# Patient Record
Sex: Female | Born: 1995 | Race: White | Hispanic: No | Marital: Single | Smoking: Never smoker
Health system: Southern US, Community
[De-identification: ages and names within clinical notes are randomized; demographics above are authoritative.]

## PROBLEM LIST (undated history)

## (undated) DIAGNOSIS — E7800 Pure hypercholesterolemia, unspecified: Secondary | ICD-10-CM

## (undated) DIAGNOSIS — J45909 Unspecified asthma, uncomplicated: Secondary | ICD-10-CM

## (undated) DIAGNOSIS — F3181 Bipolar II disorder: Secondary | ICD-10-CM

## (undated) DIAGNOSIS — J309 Allergic rhinitis, unspecified: Secondary | ICD-10-CM

## (undated) DIAGNOSIS — G4733 Obstructive sleep apnea (adult) (pediatric): Secondary | ICD-10-CM

## (undated) DIAGNOSIS — Z90710 Acquired absence of both cervix and uterus: Secondary | ICD-10-CM

## (undated) DIAGNOSIS — F319 Bipolar disorder, unspecified: Secondary | ICD-10-CM

## (undated) DIAGNOSIS — N83209 Unspecified ovarian cyst, unspecified side: Secondary | ICD-10-CM

## (undated) DIAGNOSIS — K589 Irritable bowel syndrome without diarrhea: Secondary | ICD-10-CM

## (undated) DIAGNOSIS — F419 Anxiety disorder, unspecified: Secondary | ICD-10-CM

## (undated) HISTORY — DX: Bipolar II disorder: F31.81

## (undated) HISTORY — PX: WISDOM TOOTH EXTRACTION: SHX21

## (undated) HISTORY — PX: VAGINOPLASTY: SHX329

## (undated) HISTORY — PX: OTHER SURGICAL HISTORY: SHX169

## (undated) HISTORY — DX: Unspecified asthma, uncomplicated: J45.909

## (undated) HISTORY — PX: ABDOMINAL SURGERY: SHX537

## (undated) HISTORY — DX: Allergic rhinitis, unspecified: J30.9

## (undated) HISTORY — DX: Acquired absence of both cervix and uterus: Z90.710

---
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
## 2017-10-02 ENCOUNTER — Emergency Department: Payer: BLUE CROSS/BLUE SHIELD

## 2017-10-02 ENCOUNTER — Encounter: Payer: Self-pay | Admitting: Emergency Medicine

## 2017-10-02 DIAGNOSIS — Y9389 Activity, other specified: Secondary | ICD-10-CM | POA: Diagnosis not present

## 2017-10-02 DIAGNOSIS — W08XXXA Fall from other furniture, initial encounter: Secondary | ICD-10-CM | POA: Diagnosis not present

## 2017-10-02 DIAGNOSIS — S0083XA Contusion of other part of head, initial encounter: Secondary | ICD-10-CM | POA: Insufficient documentation

## 2017-10-02 DIAGNOSIS — S0990XA Unspecified injury of head, initial encounter: Secondary | ICD-10-CM | POA: Diagnosis not present

## 2017-10-02 DIAGNOSIS — Y92013 Bedroom of single-family (private) house as the place of occurrence of the external cause: Secondary | ICD-10-CM | POA: Diagnosis not present

## 2017-10-02 DIAGNOSIS — Y999 Unspecified external cause status: Secondary | ICD-10-CM | POA: Insufficient documentation

## 2017-10-02 NOTE — ED Triage Notes (Signed)
Patient hit her head tonight on her windowsill and has spotty memory after. She also says her eyes have been dilated since with feelings of nausea and no vomiting.  Pt reports headache and photosensitivity.  Pt denies LOC or laceration to head.

## 2017-10-02 NOTE — ED Notes (Signed)
Patient transported to CT 

## 2017-10-02 NOTE — ED Triage Notes (Signed)
Patient is AOx4 in NAD

## 2017-10-03 ENCOUNTER — Emergency Department
Admission: EM | Admit: 2017-10-03 | Discharge: 2017-10-03 | Disposition: A | Discharge status: Home / Self Care | Payer: BLUE CROSS/BLUE SHIELD | Attending: Emergency Medicine | Admitting: Emergency Medicine

## 2017-10-03 DIAGNOSIS — S0990XA Unspecified injury of head, initial encounter: Secondary | ICD-10-CM

## 2017-10-03 DIAGNOSIS — S0093XA Contusion of unspecified part of head, initial encounter: Secondary | ICD-10-CM

## 2017-10-03 HISTORY — DX: Bipolar disorder, unspecified: F31.9

## 2017-10-03 HISTORY — DX: Anxiety disorder, unspecified: F41.9

## 2017-10-03 HISTORY — DX: Pure hypercholesterolemia, unspecified: E78.00

## 2017-10-03 NOTE — ED Provider Notes (Signed)
[facility] Emergency Department Provider Note  ____________________________________________   First MD Initiated Contact with Patient 10/03/17 0319     (approximate)  I have reviewed the triage vital signs and the nursing notes.   HISTORY  Chief Complaint Possible Concussion    HPI Linda Chambers is a 22 y.o. female with medical history as listed below who presents for evaluation of a head injury.  She reports that she was getting into bed in her apartment and sort of jumped into the bed and struck the back of her head on a windowsill.  She states that she did not lose consciousness but she cried afterwards for period of time.  She has had a generalized headache since that time with some sensitivity to light.  She says that her eyes have been dilated since that time and she knows that is bad in the setting of a head injury.  She has had nausea but no vomiting.  She feels like she might be a little bit confused about the events surrounding the injury but in general her memory is fine.  She presents with a friend.  She has no laceration on the back of her head and is not sure if she has any swelling.  She denies any other injuries including no neck pain, chest pain, shortness of breath, abdominal pain, or recent dysuria or fever/chills.  She describes the onset as acute and severe.  Past Medical History:  Diagnosis Date  . Anxiety   . Bipolar 1 disorder (HCC)   . Hypercholesteremia     There are no active problems to display for this patient.   Past Surgical History:  Procedure Laterality Date  . ABDOMINAL SURGERY    . WISDOM TOOTH EXTRACTION Bilateral     Prior to Admission medications   Not on File    Allergies Patient has no known allergies.  No family history on file.  Social History Social History   Tobacco Use  . Smoking status: Never Smoker  . Smokeless tobacco: Never Used  Substance Use Topics  . Alcohol use: Never    Frequency: Never   . Drug use: Never    Review of Systems Constitutional: No fever/chills Eyes: Dilated pupils and some photosensitivity ENT:  no neck or throat pain Cardiovascular: Denies chest pain. Respiratory: Denies shortness of breath. Gastrointestinal: No abdominal pain.  Nausea, no vomiting.  No diarrhea.  No constipation. Genitourinary: Negative for dysuria. Musculoskeletal: Negative for neck pain.  Negative for back pain. Integumentary: Negative for rash. Neurological: Generalized headache, questionable spotty memory, dilated pupils.  No numbness or weakness in her extremities.   ____________________________________________   PHYSICAL EXAM:  VITAL SIGNS: ED Triage Vitals  Enc Vitals Group     BP 10/02/17 2307 126/80     Pulse Rate 10/02/17 2307 98     Resp 10/02/17 2307 18     Temp 10/02/17 2307 98.8 F (37.1 C)     Temp Source 10/02/17 2307 Oral     SpO2 10/02/17 2307 98 %     Weight --      Height --      Head Circumference --      Peak Flow --      Pain Score 10/02/17 2308 7     Pain Loc --      Pain Edu? --      Excl. in GC? --     Constitutional: Alert and oriented. Well appearing and in no acute distress. Eyes:  Conjunctivae are normal.  Pupils are large bilaterally but are appropriately responsive to light with normal accommodation bilaterally.  Normal extraocular movement. Head: Atraumatic.  I cannot palpate any hematoma and there is no laceration.  She reports some tenderness to the back of her head. Nose: No congestion/rhinnorhea. Mouth/Throat: Mucous membranes are moist. Neck: No stridor.  No meningeal signs.  No cervical spine tenderness to palpation.  Normal range of motion with flexion, extension, and rotation that is nontender and nonpainful. Cardiovascular: Normal rate, regular rhythm. Good peripheral circulation. Grossly normal heart sounds. Respiratory: Normal respiratory effort.  No retractions. Lungs CTAB. Gastrointestinal: Soft and nontender. No  distention.  Musculoskeletal: No lower extremity tenderness nor edema. No gross deformities of extremities. Neurologic:  Normal speech and language. No gross focal neurologic deficits are appreciated.  Skin:  Skin is warm, dry and intact. No rash noted. Psychiatric: Mood and affect are normal. Speech and behavior are normal.  ____________________________________________   LABS (all labs ordered are listed, but only abnormal results are displayed)  Labs Reviewed - No data to display ____________________________________________  EKG  None - EKG not ordered by ED physician ____________________________________________  RADIOLOGY   ED MD interpretation: Radiology reports no indication of any acute intracranial process  Official radiology report(s): Ct Head Wo Contrast  Result Date: 10/03/2017 CLINICAL DATA:  Posttraumatic headache. Patient hit head on the window sill and has body memory afterwards. Eyes dilated. Nausea. EXAM: CT HEAD WITHOUT CONTRAST TECHNIQUE: Contiguous axial images were obtained from the base of the skull through the vertex without intravenous contrast. COMPARISON:  None. FINDINGS: Brain: No evidence of acute infarction, hemorrhage, hydrocephalus, extra-axial collection or mass lesion/mass effect. Vascular: No hyperdense vessel or unexpected calcification. Skull: Normal. Negative for fracture or focal lesion. Sinuses/Orbits: No acute finding. Other: None. IMPRESSION: No acute intracranial abnormalities. Electronically Signed   By: Burman NievesWilliam  Stevens M.D.   On: 10/03/2017 00:50    ____________________________________________   PROCEDURES  Critical Care performed: No   Procedure(s) performed:   Procedures   ____________________________________________   INITIAL IMPRESSION / ASSESSMENT AND PLAN / ED COURSE  As part of my medical decision making, I reviewed the following data within the electronic MEDICAL RECORD NUMBER Nursing notes reviewed and incorporated      Differential diagnosis includes, but is not limited to, minor head injury without loss of consciousness, concussion, intracranial bleeding such as a subdural or subarachnoid hemorrhage, skull fracture, etc.  Fortunately the patient's physical exam is quite reassuring.  I believe that the nature of the injury scared her, but she has no physical findings of acute injury and a reassuring head CT.  I had my usual customary discussion about minor head injuries, concussions and why they are a clinical diagnosis, and my usual recommendations to avoid contact sports and to try and stick with brain rest when possible although in her case I explained that I do not think it is absolutely necessary and she should just limit herself by how she feels.  I encouraged over-the-counter pain medication and plenty of rest.  She understands and agrees with the plan.     ____________________________________________  FINAL CLINICAL IMPRESSION(S) / ED DIAGNOSES  Final diagnoses:  Minor head injury, initial encounter  Contusion of head, unspecified part of head, initial encounter     MEDICATIONS GIVEN DURING THIS VISIT:  Medications - No data to display   ED Discharge Orders    None       Note:  This document was prepared using Dragon  voice recognition software and may include unintentional dictation errors.    Loleta Rose, MD 10/03/17 [phone]

## 2017-10-03 NOTE — Discharge Instructions (Signed)
You were seen in the Emergency Department (ED) today for a head injury. Your evaluation and imaging were reassuring.  I do not think you have a concussion, and I think you will improve rapidly.  However, symptoms to expect from a concussion include nausea, mild to moderate headache, difficulty concentrating or sleeping, and mild lightheadedness.  These symptoms should improve over the next few days to weeks, but it may take many weeks before you feel back to normal.  Return to the emergency department or follow-up with your primary care doctor if your symptoms are not improving over this time.  Signs of a more serious head injury include vomiting, severe headache, excessive sleepiness or confusion, and weakness or numbness in your face, arms or legs.  Return immediately to the Emergency Department if you experience any of these more concerning symptoms.    Rest, avoid strenuous physical or mental activity, and avoid activities that could potentially result in another head injury until all your symptoms from this head injury are completely resolved for at least 2-3 weeks.  If you participate in sports, get cleared by your doctor or trainer before returning to play.  You may take ibuprofen or acetaminophen over the counter according to label instructions for mild headache or scalp soreness.

## 2017-10-03 NOTE — ED Notes (Signed)
Warm blanket provided to pt.

## 2017-10-03 NOTE — ED Notes (Signed)
Pt complaining of headache and also informed that provider would see pt and order medication if needed.

## 2017-10-14 ENCOUNTER — Encounter: Payer: Self-pay | Admitting: Family Medicine

## 2017-10-14 ENCOUNTER — Ambulatory Visit (INDEPENDENT_AMBULATORY_CARE_PROVIDER_SITE_OTHER): Payer: BLUE CROSS/BLUE SHIELD | Admitting: Family Medicine

## 2017-10-14 VITALS — BP 122/80 | HR 95 | Ht 64.25 in | Wt 188.2 lb

## 2017-10-14 DIAGNOSIS — Z7689 Persons encountering health services in other specified circumstances: Secondary | ICD-10-CM | POA: Diagnosis not present

## 2017-10-14 DIAGNOSIS — Z803 Family history of malignant neoplasm of breast: Secondary | ICD-10-CM | POA: Diagnosis not present

## 2017-10-14 DIAGNOSIS — J4520 Mild intermittent asthma, uncomplicated: Secondary | ICD-10-CM | POA: Diagnosis not present

## 2017-10-14 DIAGNOSIS — F419 Anxiety disorder, unspecified: Secondary | ICD-10-CM

## 2017-10-14 DIAGNOSIS — F3130 Bipolar disorder, current episode depressed, mild or moderate severity, unspecified: Secondary | ICD-10-CM | POA: Diagnosis not present

## 2017-10-14 NOTE — Progress Notes (Signed)
 Subjective:    Patient ID: Linda Chambers, female    DOB: 08/12/1995, 21 y.o.   MRN: [id number]  HPI Chief Complaint  Patient presents with  . new pt    new pt get established. wants to talk about seeing obgyn.    She is new to the practice and here to establish care: moved here from [place].  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 Plans to go to graduate school for social work.   Previous medical care: urgent care and in [place].   Concerns today include: Breast cancer in family. Maternal aunt diagnosed in her 30s. MGM diagnosed in her 50s.  She has been tested for BRCA gene and negative. This was done in [place] at her PCP.   States she would like to be taught how to do self breast exams.  She lost weight recently, 16 lbs by eating healthy and reducing her calories and exercising.   Acne- taking spironolactone for one year and she has noticed significant improvement in her acne  Exercise-induced asthma-states she has an albuterol inhaler at home that she uses as needed.  No recent flares.  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  States she does have her ovaries.  Other providers: Dr. Kupaur - psychiatrist in [place] and sees her every few months.  History of bipolar depression and anxiety. On Latuda and states that this medication has helped tremendously.  States she has tried multiple medications in the past. Sees counselor Tina Thompson in [place] every 2 weeks. In therapy since age 14.    Social history: [place] campus, works in a fellowship on campus. In a relationship with a female.  Denies smoking, drinking alcohol, drug use  Diet: healthy  Excerise: a couple times per week  Sexually active and uses condoms.   Has eye exams annually.  Dentist in [place].   Depression screen PHQ 2/9 10/14/2017  Decreased Interest 1  Down, Depressed, Hopeless 1  PHQ - 2 Score 2  Altered sleeping 3  Tired, decreased energy 3  Change in  appetite 3  Feeling bad or failure about yourself  0  Trouble concentrating 0  Moving slowly or fidgety/restless 0  Suicidal thoughts 0  PHQ-9 Score 11  Difficult doing work/chores Not difficult at all    Reviewed allergies, medications, past medical, surgical, family, and social history.   Review of Systems Pertinent positives and negatives in the history of present illness.     Objective:   Physical Exam  Constitutional: She is oriented to person, place, and time. She appears well-developed and well-nourished. No distress.  Cardiovascular: Normal rate, regular rhythm and normal heart sounds.  Pulmonary/Chest: Effort normal and breath sounds normal.  Normal breast exam- done per patient request  Neurological: She is alert and oriented to person, place, and time. She has normal strength. No cranial nerve deficit or sensory deficit.  Skin: Skin is warm and dry. Capillary refill takes less than 2 seconds.  Psychiatric: She has a normal mood and affect. Her speech is normal and behavior is normal. Thought content normal.   BP 122/80   Pulse 95   Ht 5' 4.25" (1.632 m)   Wt 188 lb 3.2 oz (85.4 kg)   BMI 32.05 kg/m       Assessment & Plan:  Bipolar affective disorder, current episode depressed, current episode severity unspecified (HCC)  Anxiety  Family history of breast cancer  Encounter to establish care  Mild intermittent asthma without   complication  Breast exam and teaching done on how to do self breast exams per patient request due to family history.  A handout of breast exams provided.  Asthma is well controlled. She has albuterol inhaler at home. No recent flares.  Bipolar and anxiety managed by her psychiatrist and counselor. No concerns today about this.  Follow up as needed. Will request medical records from [place].

## 2017-10-14 NOTE — Patient Instructions (Signed)

## 2017-10-21 ENCOUNTER — Telehealth: Payer: Self-pay | Admitting: Family Medicine

## 2017-10-21 NOTE — Telephone Encounter (Signed)
Received requested records from Dr. Jolyn LentStephanie Stegall. Sending back for review.

## 2017-11-24 ENCOUNTER — Encounter: Payer: Self-pay | Admitting: Family Medicine

## 2018-01-22 ENCOUNTER — Ambulatory Visit (INDEPENDENT_AMBULATORY_CARE_PROVIDER_SITE_OTHER): Payer: BLUE CROSS/BLUE SHIELD | Admitting: Family Medicine

## 2018-01-22 ENCOUNTER — Encounter: Payer: Self-pay | Admitting: Family Medicine

## 2018-01-22 VITALS — BP 110/80 | HR 90 | Temp 98.3°F | Resp 16 | Wt 194.4 lb

## 2018-01-22 DIAGNOSIS — Z79899 Other long term (current) drug therapy: Secondary | ICD-10-CM | POA: Diagnosis not present

## 2018-01-22 DIAGNOSIS — R519 Headache, unspecified: Secondary | ICD-10-CM

## 2018-01-22 DIAGNOSIS — R7309 Other abnormal glucose: Secondary | ICD-10-CM

## 2018-01-22 DIAGNOSIS — R51 Headache: Secondary | ICD-10-CM

## 2018-01-22 DIAGNOSIS — E781 Pure hyperglyceridemia: Secondary | ICD-10-CM

## 2018-01-22 NOTE — Patient Instructions (Signed)
Keep a headache journal as discussed with signs, symptoms, triggers.   Stay well hydrated, avoid skipping meals, and avoid fluctuations in caffeine.  We will call you with your lab results.   Return in 2 weeks.    Migraine Headache A migraine headache is an intense, throbbing pain on one side or both sides of the head. Migraines may also cause other symptoms, such as nausea, vomiting, and sensitivity to light and noise. What are the causes? Doing or taking certain things may also trigger migraines, such as:  Alcohol.  Smoking.  Medicines, such as: ? Medicine used to treat chest pain (nitroglycerine). ? Birth control pills. ? Estrogen pills. ? Certain blood pressure medicines.  Aged cheeses, chocolate, or caffeine.  Foods or drinks that contain nitrates, glutamate, aspartame, or tyramine.  Physical activity.  Other things that may trigger a migraine include:  Menstruation.  Pregnancy.  Hunger.  Stress, lack of sleep, too much sleep, or fatigue.  Weather changes.  What increases the risk? The following factors may make you more likely to experience migraine headaches:  Age. Risk increases with age.  Family history of migraine headaches.  Being Caucasian.  Depression and anxiety.  Obesity.  Being a woman.  Having a hole in the heart (patent foramen ovale) or other heart problems.  What are the signs or symptoms? The main symptom of this condition is pulsating or throbbing pain. Pain may:  Happen in any area of the head, such as on one side or both sides.  Interfere with daily activities.  Get worse with physical activity.  Get worse with exposure to bright lights or loud noises.  Other symptoms may include:  Nausea.  Vomiting.  Dizziness.  General sensitivity to bright lights, loud noises, or smells.  Before you get a migraine, you may get warning signs that a migraine is developing (aura). An aura may include:  Seeing flashing lights or  having blind spots.  Seeing bright spots, halos, or zigzag lines.  Having tunnel vision or blurred vision.  Having numbness or a tingling feeling.  Having trouble talking.  Having muscle weakness.  How is this diagnosed? A migraine headache can be diagnosed based on:  Your symptoms.  A physical exam.  Tests, such as CT scan or MRI of the head. These imaging tests can help rule out other causes of headaches.  Taking fluid from the spine (lumbar puncture) and analyzing it (cerebrospinal fluid analysis, or CSF analysis).  How is this treated? A migraine headache is usually treated with medicines that:  Relieve pain.  Relieve nausea.  Prevent migraines from coming back.  Treatment may also include:  Acupuncture.  Lifestyle changes like avoiding foods that trigger migraines.  Follow these instructions at home: Medicines  Take over-the-counter and prescription medicines only as told by your health care provider.  Do not drive or use heavy machinery while taking prescription pain medicine.  To prevent or treat constipation while you are taking prescription pain medicine, your health care provider may recommend that you: ? Drink enough fluid to keep your urine clear or pale yellow. ? Take over-the-counter or prescription medicines. ? Eat foods that are high in fiber, such as fresh fruits and vegetables, whole grains, and beans. ? Limit foods that are high in fat and processed sugars, such as fried and sweet foods. Lifestyle  Avoid alcohol use.  Do not use any products that contain nicotine or tobacco, such as cigarettes and e-cigarettes. If you need help quitting, ask your health  care provider.  Get at least 8 hours of sleep every night.  Limit your stress. General instructions   Keep a journal to find out what may trigger your migraine headaches. For example, write down: ? What you eat and drink. ? How much sleep you get. ? Any change to your diet or  medicines.  If you have a migraine: ? Avoid things that make your symptoms worse, such as bright lights. ? It may help to lie down in a dark, quiet room. ? Do not drive or use heavy machinery. ? Ask your health care provider what activities are safe for you while you are experiencing symptoms.  Keep all follow-up visits as told by your health care provider. This is important. Contact a health care provider if:  You develop symptoms that are different or more severe than your usual migraine symptoms. Get help right away if:  Your migraine becomes severe.  You have a fever.  You have a stiff neck.  You have vision loss.  Your muscles feel weak or like you cannot control them.  You start to lose your balance often.  You develop trouble walking.  You faint. This information is not intended to replace advice given to you by your health care provider. Make sure you discuss any questions you have with your health care provider. Document Released: 03/31/2005 Document Revised: 10/19/2015 Document Reviewed: 09/17/2015 Elsevier Interactive Patient Education  2017 [employer].

## 2018-01-22 NOTE — Progress Notes (Signed)
Subjective:    Patient ID: Linda Chambers, female    DOB: 07-01-1995, 22 y.o.   MRN: [id number]  HPI Chief Complaint  Patient presents with  . headache    consistent headache. headache for a month- nausated and have to turn lights out, family hx of migraines   She is a 22 year old female with a history of bipolar disorder, hypertriglyceridemia, anxiety and acne who is here with complaints of a one month history of intermittent headaches that are "all over" and throbbing. Reports having similar headaches as a child but her mother never took her to a doctor to be evaluated. States headaches are at various times of the day and last anywhere from an hour to a full day.  Last one was 2 days ago.  Reports having 2-3 per week for the past month. Associated nausea, photosensitivity. No photophobia.  Denies having an aura.   States headaches always resolve with Extra Strength Tylenol or Excedrin. Takes one of these 2 days per week.   Denies fever, chills, dizziness, tinnitus, vision changes, chest pain, palpitations, shortness of breath, abdominal pain, N/V/D, urinary symptoms, LE edema.  No uri symptoms.  Reports drinking 1 cup of coffee daily and  1-2 diet cokes per week. States she thinks she is well hydrated.  Sleep varies and she often skips breakfast.   Latuda dose increased by her psychiatrist 2 weeks ago. Taking spironolactone for acne. Started on this in 2017 by her pediatrician. Questions if she can stop the medication.   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  Denies smoking, alcohol or drug use.    Family history of migraines in father and grandmother per patient.     Reviewed allergies, medications, past medical, surgical, family, and social history.    Review of Systems Pertinent positives and negatives in the history of present illness.     Objective:   Physical Exam BP 110/80   Pulse 90   Temp 98.3 F (36.8 C) (Oral)   Resp 16   Wt 194 lb 6.4 oz  (88.2 kg)   SpO2 98%   BMI 33.11 kg/m   Alert and oriented and in no distress. No sinus tenderness. Tympanic membranes and canals are normal. Pharyngeal area is normal. Neck is supple without adenopathy or thyromegaly. Cardiac exam shows a regular sinus rhythm without murmurs or gallops. Lungs are clear to auscultation. Extremities without edema, intact distal pulses. Skin is warm and dry, no pallor or rash. PERRLA, EOMs intact. No facial asymmetry, normal finger to nose. CNs intact. Normal gait.       Assessment & Plan:  Intermittent headache - Plan: CBC with Differential/Platelet, Comprehensive metabolic panel  Medication management - Plan: CBC with Differential/Platelet, Comprehensive metabolic panel  Elevated hemoglobin A1c measurement - Plan: CBC with Differential/Platelet, Comprehensive metabolic panel, Hemoglobin A1c  Hypertriglyceridemia - Plan: CBC with Differential/Platelet, Comprehensive metabolic panel, Lipid panel  Reviewed CT head from her visit in the ED in June 2019 and it was negative.  Normal neurological exam. No headache in the past 2 days.  She apparently has a history of similar headaches from childhood but denies ever being evaluated for these until today.  Headaches sound migrainous in nature. Will check labs due to history of elevated triglycerides related to medication and A1c due to Hgb A1c 5.7% last year.  Will have her keep a headache journal and avoid fluctuations in sleep, hydration, caffeine and avoid skipping meals.   Discussed sending her to  a headache specialist and she declines for now. I am not starting her on preventive medication today but after she returns in 2 weeks we will discuss options again. She will return in 2 weeks.

## 2018-01-23 LAB — COMPREHENSIVE METABOLIC PANEL
ALT: 23 IU/L (ref 0–32)
AST: 16 IU/L (ref 0–40)
Albumin/Globulin Ratio: 1.6 (ref 1.2–2.2)
Albumin: 4.6 g/dL (ref 3.5–5.5)
Alkaline Phosphatase: 100 IU/L (ref 39–117)
BUN/Creatinine Ratio: 10 (ref 9–23)
BUN: 8 mg/dL (ref 6–20)
Bilirubin Total: <0.2 mg/dL (ref 0.0–1.2)
CO2: 26 mmol/L (ref 20–29)
Calcium: 10.2 mg/dL (ref 8.7–10.2)
Chloride: 99 mmol/L (ref 96–106)
Creatinine, Ser: 0.83 mg/dL (ref 0.57–1.00)
GFR calc Af Amer: 117 mL/min/{1.73_m2} (ref >59)
GFR calc non Af Amer: 102 mL/min/{1.73_m2} (ref >59)
Globulin, Total: 2.8 g/dL (ref 1.5–4.5)
Glucose: 92 mg/dL (ref 65–99)
Potassium: 4.5 mmol/L (ref 3.5–5.2)
Sodium: 140 mmol/L (ref 134–144)
Total Protein: 7.4 g/dL (ref 6.0–8.5)

## 2018-01-23 LAB — CBC WITH DIFFERENTIAL/PLATELET
Basophils Absolute: 0 10*3/uL (ref 0.0–0.2)
Basos: 0 %
EOS (ABSOLUTE): 0.2 10*3/uL (ref 0.0–0.4)
Eos: 2 %
Hematocrit: 39.9 % (ref 34.0–46.6)
Hemoglobin: 13.5 g/dL (ref 11.1–15.9)
Immature Grans (Abs): 0 10*3/uL (ref 0.0–0.1)
Immature Granulocytes: 0 %
Lymphocytes Absolute: 3.4 10*3/uL — ABNORMAL HIGH (ref 0.7–3.1)
Lymphs: 36 %
MCH: 26.7 pg (ref 26.6–33.0)
MCHC: 33.8 g/dL (ref 31.5–35.7)
MCV: 79 fL (ref 79–97)
Monocytes Absolute: 0.6 10*3/uL (ref 0.1–0.9)
Monocytes: 6 %
Neutrophils Absolute: 5.1 10*3/uL (ref 1.4–7.0)
Neutrophils: 56 %
Platelets: 433 10*3/uL (ref 150–450)
RBC: 5.05 x10E6/uL (ref 3.77–5.28)
RDW: 13.7 % (ref 12.3–15.4)
WBC: 9.3 10*3/uL (ref 3.4–10.8)

## 2018-01-23 LAB — HEMOGLOBIN A1C
Est. average glucose Bld gHb Est-mCnc: 108 mg/dL
Hgb A1c MFr Bld: 5.4 % (ref 4.8–5.6)

## 2018-01-23 LAB — LIPID PANEL
Chol/HDL Ratio: 6 ratio — ABNORMAL HIGH (ref 0.0–4.4)
Cholesterol, Total: 216 mg/dL — ABNORMAL HIGH (ref 100–199)
HDL: 36 mg/dL — ABNORMAL LOW (ref >39)
Triglycerides: 507 mg/dL — ABNORMAL HIGH (ref 0–149)

## 2018-02-11 ENCOUNTER — Encounter: Payer: Self-pay | Admitting: Family Medicine

## 2018-02-11 ENCOUNTER — Ambulatory Visit (INDEPENDENT_AMBULATORY_CARE_PROVIDER_SITE_OTHER): Payer: BLUE CROSS/BLUE SHIELD | Admitting: Family Medicine

## 2018-02-11 VITALS — BP 122/80 | HR 95 | Wt 200.2 lb

## 2018-02-11 DIAGNOSIS — J4520 Mild intermittent asthma, uncomplicated: Secondary | ICD-10-CM

## 2018-02-11 DIAGNOSIS — R519 Headache, unspecified: Secondary | ICD-10-CM

## 2018-02-11 DIAGNOSIS — G8929 Other chronic pain: Secondary | ICD-10-CM

## 2018-02-11 DIAGNOSIS — J309 Allergic rhinitis, unspecified: Secondary | ICD-10-CM

## 2018-02-11 DIAGNOSIS — R51 Headache: Secondary | ICD-10-CM

## 2018-02-11 DIAGNOSIS — F419 Anxiety disorder, unspecified: Secondary | ICD-10-CM | POA: Insufficient documentation

## 2018-02-11 DIAGNOSIS — E7800 Pure hypercholesterolemia, unspecified: Secondary | ICD-10-CM | POA: Insufficient documentation

## 2018-02-11 DIAGNOSIS — J45909 Unspecified asthma, uncomplicated: Secondary | ICD-10-CM | POA: Insufficient documentation

## 2018-02-11 DIAGNOSIS — F319 Bipolar disorder, unspecified: Secondary | ICD-10-CM | POA: Insufficient documentation

## 2018-02-11 NOTE — Progress Notes (Signed)
   Subjective:    Patient ID: Linda Chambers, female    DOB: 07/09/1995, 22 y.o.   MRN: [id number]  HPI Chief Complaint  Patient presents with  . 2-3 week follow-up    headaches, not waking up with them, getting them mid day, 1 a week that is really bad   She is a 22 year old female with a history of seasonal allergies and mild intermittent asthma who is here to follow up on headaches. Questionable history of migraines without aura. States she recalled having more headaches in the past during summer months. Since her previous visit, she reports headaches have improved in regards to frequency and intensity overall. No longer waking up with as many headaches. States she has noticed improvement with increasing her water intake.   States last week she had the worst headache of her life. It was a frontal headache, bilateral and described as a throbbing pain. States it lasted until she took Excedrin. She had associated nausea and photophobia. States she missed class that day.   Reports eating breakfast most mornings now, stopping diet sodas and cutting caffeine back to one cup of coffee daily.   She saw her psychiatrist last week.  She saw her allergist when she went home to [place] last week. She is taking Zyrtec daily for allergies. No asthma flares. She uses albuterol before exercising.   No new symptoms.   Denies fever, chills, dizziness, vision changes, chest pain, palpitations, shortness of breath, abdominal pain, V/D.   Reviewed allergies, medications, past medical, surgical, family, and social history.    Review of Systems Pertinent positives and negatives in the history of present illness.     Objective:   Physical Exam BP 122/80   Pulse 95   Wt 200 lb 3.2 oz (90.8 kg) Comment: with rain boots on  BMI 34.10 kg/m   Alert and oriented and in no acute distress.  Respirations unlabored.  Skin is warm and dry, no pallor.  Normal facial symmetry and movement.  Normal speech, mood  and thought process.  Normal gait      Assessment & Plan:  Chronic nonintractable headache, unspecified headache type  Mild intermittent asthma without complication  Allergic rhinitis, unspecified seasonality, unspecified trigger  Discussed that overall her headaches are actually improving and she has noticed decreased frequency and intensity when staying hydrated.  Discussed that headaches do not appear to be related to anything worrisome.  She questions whether her headaches will change once the weather changes in the cold front comes through this evening.  I think this is reasonable since her headaches have been seasonal in the past. Allergies and asthma well controlled. Question whether allergies may be playing a role with headaches.  She will follow-up if needed.  We did discuss the possibility of a neurology referral and she would like to hold off on this for now which is appropriate.

## 2018-05-10 ENCOUNTER — Telehealth: Payer: Self-pay | Admitting: Internal Medicine

## 2018-05-10 NOTE — Telephone Encounter (Signed)
Please call and find out how long she has been on this medication and who prescribed it for her initially.  Has she been taking it and was she taking it 3 months ago when I checked her cholesterol?  Her triglycerides were significantly elevated at that time.  Please have her return to discuss this and to recheck her cholesterol, fasting.  If she has not missed any doses then I am okay sending in a 30-day prescription for her

## 2018-05-10 NOTE — Telephone Encounter (Signed)
Pt was notified. Her previous pcp did just refill med again just to be on safe side incase we didn't/ she has been taking it when she had cholesterol checked last October. She has missed a couple doses this week due to being out but will follow-up in February for a fasting follow-up. She does not need a refill at this time now  She did also say that she is on a medicine that makes her cholesterol elevated but the welchol is suppose to help bring it down as well. Just FYI

## 2018-05-10 NOTE — Telephone Encounter (Signed)
Pt called and asked for a refill on colesevelam. Back last year you told her to go through her previous pcp for a refill. She did go through her previous pcp to get a refill but she asked, when would you be able to refill this since she now comes here. Please advise

## 2018-05-13 ENCOUNTER — Ambulatory Visit: Payer: BLUE CROSS/BLUE SHIELD | Admitting: Family Medicine

## 2018-05-13 ENCOUNTER — Encounter: Payer: Self-pay | Admitting: Family Medicine

## 2018-05-13 VITALS — BP 110/74 | HR 81 | Temp 98.5°F | Wt 211.2 lb

## 2018-05-13 DIAGNOSIS — R1032 Left lower quadrant pain: Secondary | ICD-10-CM

## 2018-05-13 DIAGNOSIS — R10814 Left lower quadrant abdominal tenderness: Secondary | ICD-10-CM | POA: Diagnosis not present

## 2018-05-13 LAB — POCT URINALYSIS DIP (PROADVANTAGE DEVICE)
Bilirubin, UA: NEGATIVE
Blood, UA: NEGATIVE
Glucose, UA: NEGATIVE mg/dL
Ketones, POC UA: NEGATIVE mg/dL
Leukocytes, UA: NEGATIVE
Nitrite, UA: NEGATIVE
Protein Ur, POC: NEGATIVE mg/dL
Specific Gravity, Urine: 1.02
Urobilinogen, Ur: NEGATIVE
pH, UA: 6 (ref 5.0–8.0)

## 2018-05-13 NOTE — Progress Notes (Signed)
Subjective:    Patient ID: Linda Chambers, female    DOB: 30-Dec-1995, 23 y.o.   MRN: [id number]  HPI Chief Complaint  Patient presents with  . stomach    stomach pain- left sided pain near hip.    Here with complaints of a 3 day history of left lower abdomina/pelvis pain that is constant and worse with movement. Pain is non radiating. Does not seem to be aggravated by anything other than movement. Pain is improved with rest.  Denies injury or similar history of pain in the past.   States she has been using a heating pad. Took Ibuprofen 400 mg once yesterday but nothing today.  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 States she still has her ovaries.  States she would like an [place].   Denies fever, chills, night sweats, unexplained weight loss, fatigue, chest pain, palpitations, N/V/D or constipation. No changes in bowel habits. Denies urinary symptoms.    Review of Systems Pertinent positives and negatives in the history of present illness.     Objective:   Physical Exam Constitutional:      General: She is not in acute distress.    Appearance: Normal appearance. She is not ill-appearing.  HENT:     Mouth/Throat:     Mouth: Mucous membranes are moist.     Pharynx: Oropharynx is clear.  Eyes:     Conjunctiva/sclera: Conjunctivae normal.  Neck:     Musculoskeletal: Normal range of motion and neck supple.  Cardiovascular:     Rate and Rhythm: Normal rate and regular rhythm.     Pulses: Normal pulses.     Heart sounds: Normal heart sounds.  Pulmonary:     Effort: Pulmonary effort is normal.     Breath sounds: Normal breath sounds.  Abdominal:     General: Abdomen is flat. Bowel sounds are normal. There is no distension.     Palpations: Abdomen is soft.     Tenderness: There is abdominal tenderness in the left lower quadrant. There is no left CVA tenderness, guarding or rebound.  Negative signs include Murphy's sign, Rovsing's sign, McBurney's sign and psoas sign.    Musculoskeletal:     Left hip: Normal.     Right lower leg: No edema.     Left lower leg: No edema.  Lymphadenopathy:     Cervical: No cervical adenopathy.  Skin:    General: Skin is warm and dry.     Capillary Refill: Capillary refill takes less than 2 seconds.     Coloration: Skin is not pale.     Findings: No rash.  Neurological:     Mental Status: She is alert and oriented to person, place, and time.     Cranial Nerves: Cranial nerves are intact.     Sensory: Sensation is intact.  Psychiatric:        Attention and Perception: Attention normal.        Mood and Affect: Mood normal.        Speech: Speech normal.    BP 110/74   Pulse 81   Temp 98.5 F (36.9 C) (Oral)   Wt 211 lb 3.2 oz (95.8 kg)   SpO2 98%   BMI 35.97 kg/m       Assessment & Plan:  Left lower quadrant abdominal pain - Plan: US Pelvic Complete With Transvaginal, POCT Urinalysis DIP (Proadvantage Device), CANCELED: US Pelvis  Complete  Left lower quadrant abdominal tenderness without rebound tenderness - Plan: US Pelvic Complete With Transvaginal, CANCELED: US Pelvis Complete  Urinalysis dipstick: negative  Discussed that there are no red flag symptoms.  Advised that her pain appears to be musculoskeletal related and recommend trying an antiinflammatory for a couple of days and see how she is doing at that point. She is adamant that she would like to have an ultrasound to look at her left ovary.  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 US ordered per patient request.  Recommend continue use of heating pad and ibuprofen 800 mg 3 times a day or 2 Aleve twice Linda.

## 2018-05-13 NOTE — Patient Instructions (Signed)
I recommend you take Ibuprofen 800 mg three times daily or you can take 2 Aleve twice daily if you prefer. Take this with food and a full glass of water.   Continue using a heating pad.

## 2018-05-14 ENCOUNTER — Ambulatory Visit
Admission: RE | Admit: 2018-05-14 | Discharge: 2018-05-14 | Disposition: A | Discharge status: Home / Self Care | Payer: BLUE CROSS/BLUE SHIELD | Source: Ambulatory Visit | Attending: Family Medicine | Admitting: Family Medicine

## 2018-05-14 ENCOUNTER — Other Ambulatory Visit: Payer: Self-pay | Admitting: Family Medicine

## 2018-05-14 ENCOUNTER — Other Ambulatory Visit: Payer: Self-pay

## 2018-05-14 DIAGNOSIS — R1032 Left lower quadrant pain: Secondary | ICD-10-CM | POA: Diagnosis present

## 2018-05-14 DIAGNOSIS — R110 Nausea: Secondary | ICD-10-CM | POA: Insufficient documentation

## 2018-05-14 DIAGNOSIS — N83202 Unspecified ovarian cyst, left side: Secondary | ICD-10-CM | POA: Insufficient documentation

## 2018-05-14 DIAGNOSIS — Z79899 Other long term (current) drug therapy: Secondary | ICD-10-CM | POA: Insufficient documentation

## 2018-05-14 DIAGNOSIS — R10814 Left lower quadrant abdominal tenderness: Secondary | ICD-10-CM

## 2018-05-14 DIAGNOSIS — R197 Diarrhea, unspecified: Secondary | ICD-10-CM | POA: Diagnosis not present

## 2018-05-14 MED ORDER — TRAMADOL HCL 50 MG PO TABS: 50.0000 mg | ORAL_TABLET | Freq: Three times a day (TID) | ORAL | 0 refills | Status: DC | PRN

## 2018-05-14 NOTE — Telephone Encounter (Signed)
Pt takes she is taking ibuprofen twice of 800mg , (last night and today). Normal bowels, no fever.   Per vickie, no cat scan at this point but can send in tramadol 1 tab every 8 hours. If worsening over weekend, go to ER. If worsening next week, come back in

## 2018-05-14 NOTE — ED Triage Notes (Signed)
Patient reports abdominal pain for 4 days had ultrasound earlier today.

## 2018-05-14 NOTE — Telephone Encounter (Signed)
Pt called and said you told her to follow up with her today she stated that she is having nausea and her pain has gotten worse today. She wants to know what should she do?

## 2018-05-14 NOTE — Telephone Encounter (Signed)
[facility] called about pain medicine and ok'd per [facility]

## 2018-05-14 NOTE — Telephone Encounter (Signed)
Martie LeeSabrina, were you able to contact her yet?  Please call and find out if her pain is any different or any symptoms have changed.  If she taking 800 mg of ibuprofen 3 times daily?

## 2018-05-15 ENCOUNTER — Emergency Department: Payer: BLUE CROSS/BLUE SHIELD

## 2018-05-15 ENCOUNTER — Emergency Department
Admission: EM | Admit: 2018-05-15 | Discharge: 2018-05-15 | Disposition: A | Discharge status: Home / Self Care | Payer: BLUE CROSS/BLUE SHIELD | Attending: Emergency Medicine | Admitting: Emergency Medicine

## 2018-05-15 DIAGNOSIS — R1032 Left lower quadrant pain: Secondary | ICD-10-CM

## 2018-05-15 DIAGNOSIS — N83202 Unspecified ovarian cyst, left side: Secondary | ICD-10-CM

## 2018-05-15 LAB — COMPREHENSIVE METABOLIC PANEL
ALT: 26 U/L (ref 0–44)
AST: 26 U/L (ref 15–41)
Albumin: 3.9 g/dL (ref 3.5–5.0)
Alkaline Phosphatase: 72 U/L (ref 38–126)
Anion gap: 8 (ref 5–15)
BUN: 10 mg/dL (ref 6–20)
CO2: 23 mmol/L (ref 22–32)
Calcium: 9.2 mg/dL (ref 8.9–10.3)
Chloride: 106 mmol/L (ref 98–111)
Creatinine, Ser: 1 mg/dL (ref 0.44–1.00)
GFR calc Af Amer: >60 mL/min (ref >60)
GFR calc non Af Amer: >60 mL/min (ref >60)
Glucose, Bld: 122 mg/dL — ABNORMAL HIGH (ref 70–99)
Potassium: 4 mmol/L (ref 3.5–5.1)
Sodium: 137 mmol/L (ref 135–145)
Total Bilirubin: 0.4 mg/dL (ref 0.3–1.2)
Total Protein: 7.1 g/dL (ref 6.5–8.1)

## 2018-05-15 LAB — CBC
HCT: 39.6 % (ref 36.0–46.0)
Hemoglobin: 12.8 g/dL (ref 12.0–15.0)
MCH: 25.6 pg — ABNORMAL LOW (ref 26.0–34.0)
MCHC: 32.3 g/dL (ref 30.0–36.0)
MCV: 79.2 fL — ABNORMAL LOW (ref 80.0–100.0)
Platelets: 380 10*3/uL (ref 150–400)
RBC: 5 MIL/uL (ref 3.87–5.11)
RDW: 14.3 % (ref 11.5–15.5)
WBC: 11.1 10*3/uL — ABNORMAL HIGH (ref 4.0–10.5)
nRBC: 0 % (ref 0.0–0.2)

## 2018-05-15 LAB — LIPASE, BLOOD: Lipase: 34 U/L (ref 11–51)

## 2018-05-15 MED ORDER — HYDROCODONE-ACETAMINOPHEN 5-325 MG PO TABS: 1.0000 | ORAL_TABLET | Freq: Four times a day (QID) | ORAL | 0 refills | Status: DC | PRN

## 2018-05-15 MED ORDER — IOPAMIDOL (ISOVUE-300) INJECTION 61%
100.0000 mL | Freq: Once | INTRAVENOUS | Status: AC | PRN
  Administered 2018-05-15: 100 mL via INTRAVENOUS

## 2018-05-15 MED ORDER — IBUPROFEN 800 MG PO TABS: 800.0000 mg | ORAL_TABLET | Freq: Three times a day (TID) | ORAL | 0 refills | Status: DC | PRN

## 2018-05-15 MED ORDER — MORPHINE SULFATE (PF) 4 MG/ML IV SOLN
4.0000 mg | Freq: Once | INTRAVENOUS | Status: AC
  Administered 2018-05-15: 4 mg via INTRAVENOUS
  Filled 2018-05-15: qty 1

## 2018-05-15 MED ORDER — SODIUM CHLORIDE 0.9 % IV BOLUS
1000.0000 mL | Freq: Once | INTRAVENOUS | Status: AC
  Administered 2018-05-15: 1000 mL via INTRAVENOUS

## 2018-05-15 MED ORDER — ONDANSETRON HCL 4 MG/2ML IJ SOLN
4.0000 mg | Freq: Once | INTRAMUSCULAR | Status: AC
  Administered 2018-05-15: 4 mg via INTRAVENOUS
  Filled 2018-05-15: qty 2

## 2018-05-15 MED ORDER — IOPAMIDOL (ISOVUE-300) INJECTION 61%: 30.0000 mL | Freq: Once | INTRAVENOUS | Status: DC | PRN

## 2018-05-15 NOTE — ED Provider Notes (Signed)
[facility] Emergency Department Provider Note   ____________________________________________   First MD Initiated Contact with Patient 05/15/18 0100     (approximate)  I have reviewed the triage vital signs and the nursing notes.   HISTORY  Chief Complaint Abdominal Pain    HPI Erling ConteRachel Chambers is a 23 y.o. female who presents to the ED from home with a chief complaint of abdominal pain.  Patient reports a 4-day history of left lower quadrant abdominal pain which is constant in nature and associated with nausea and moderate diarrhea tonight.  She was seen by her PCP with unremarkable urinalysis and had pelvic ultrasound which was unremarkable for ovarian cysts.  Presents tonight due to  increased pain as well as increased diarrhea.  Denies associated fever, chills, chest pain, shortness of breath, vomiting, dysuria.  Denies recent travel, trauma or antibiotic use.   Past Medical History:  Diagnosis Date  . Allergic rhinitis   . Anxiety   . Asthma    ?excercise only  . Bipolar 1 disorder (HCC)   . Hypercholesteremia     Patient Active Problem List   Diagnosis Date Noted  . Asthma   . Bipolar 1 disorder (HCC)   . Hypercholesteremia   . Anxiety   . Allergic rhinitis     Past Surgical History:  Procedure Laterality Date  . ABDOMINAL SURGERY    . VAGINOPLASTY    . WISDOM TOOTH EXTRACTION Bilateral     Prior to Admission medications   Medication Sig Start Date End Date Taking? Authorizing Provider  busPIRone (BUSPAR) 5 MG tablet Take 1 tablet by mouth daily.  09/06/17   [provider]  cetirizine (ZYRTEC) 10 MG tablet Take 1 tablet by mouth daily.    [provider]  colesevelam (WELCHOL) 625 MG tablet Take 2 tablets by mouth 2 (two) times daily. 10/03/17   [provider]  Cyanocobalamin (VITAMIN B-12 PO) Take by mouth.    [provider]  levalbuterol (XOPENEX HFA) 45 MCG/ACT inhaler Inhale 1 puff into the  lungs every 4 (four) hours as needed for wheezing.    [provider]  Lurasidone HCl (LATUDA) 60 MG TABS Take by mouth.    [provider]  spironolactone (ALDACTONE) 50 MG tablet Take 1 tablet by mouth daily. 10/03/17   [provider]  traMADol (ULTRAM) 50 MG tablet Take 1 tablet (50 mg total) by mouth every 8 (eight) hours as needed. 05/14/18   Henson, Zorita PangVickie L, NP-C    Allergies Patient has no known allergies.  Family History  Problem Relation Age of Onset  . Bipolar disorder Mother   . Hypertension Father   . Bipolar disorder Maternal Grandfather     Social History Social History   Tobacco Use  . Smoking status: Never Smoker  . Smokeless tobacco: Never Used  Substance Use Topics  . Alcohol use: Never    Frequency: Never  . Drug use: Never    Review of Systems  Constitutional: No fever/chills Eyes: No visual changes. ENT: No sore throat. Cardiovascular: Denies chest pain. Respiratory: Denies shortness of breath. Gastrointestinal: Positive for abdominal pain.  Positive for nausea, no vomiting.  Positive for diarrhea.  No constipation. Genitourinary: Negative for dysuria. Musculoskeletal: Negative for back pain. Skin: Negative for rash. Neurological: Negative for headaches, focal weakness or numbness.   ____________________________________________   PHYSICAL EXAM:  VITAL SIGNS: ED Triage Vitals [05/14/18 2354]  Enc Vitals Group     BP  Pulse      Resp      Temp      Temp src      SpO2      Weight 209 lb (94.8 kg)     Height 5\' 5"  (1.651 m)     Head Circumference      Peak Flow      Pain Score 8     Pain Loc      Pain Edu?      Excl. in GC?     Constitutional: Alert and oriented. Well appearing and in no acute distress. Eyes: Conjunctivae are normal. PERRL. EOMI. Head: Atraumatic. Nose: No congestion/rhinnorhea. Mouth/Throat: Mucous membranes are moist.  Oropharynx non-erythematous. Neck: No stridor.     Cardiovascular: Normal rate, regular rhythm. Grossly normal heart sounds.  Good peripheral circulation. Respiratory: Normal respiratory effort.  No retractions. Lungs CTAB. Gastrointestinal: Soft and mildly tender to palpation left lower quadrant without rebound or guarding. No distention. No abdominal bruits. No CVA tenderness. Musculoskeletal: No lower extremity tenderness nor edema.  No joint effusions. Neurologic:  Normal speech and language. No gross focal neurologic deficits are appreciated. No gait instability. Skin:  Skin is warm, dry and intact. No rash noted. Psychiatric: Mood and affect are normal. Speech and behavior are normal.  ____________________________________________   LABS (all labs ordered are listed, but only abnormal results are displayed)  Labs Reviewed  COMPREHENSIVE METABOLIC PANEL - Abnormal; Notable for the following components:      Result Value   Glucose, Bld 122 (*)    All other components within normal limits  CBC - Abnormal; Notable for the following components:   WBC 11.1 (*)    MCV 79.2 (*)    MCH 25.6 (*)    All other components within normal limits  C DIFFICILE QUICK SCREEN W PCR REFLEX  GASTROINTESTINAL PANEL BY PCR, STOOL (REPLACES STOOL CULTURE)  LIPASE, BLOOD  URINALYSIS, COMPLETE (UACMP) WITH MICROSCOPIC   ____________________________________________  EKG  None ____________________________________________  RADIOLOGY  ED MD interpretation: Left ovarian cyst  Official radiology report(s): Ct Abdomen Pelvis W Contrast  Result Date: 05/15/2018 CLINICAL DATA:  23 y/o F; 4 days of left lower quadrant abdominal pain. EXAM: CT ABDOMEN AND PELVIS WITH CONTRAST TECHNIQUE: Multidetector CT imaging of the abdomen and pelvis was performed using the standard protocol following bolus administration of intravenous contrast. CONTRAST:  ISOVUE-300 IOPAMIDOL (ISOVUE-300) INJECTION 61% COMPARISON:  05/15/2018 pelvic ultrasound. FINDINGS: Lower  chest: No acute abnormality. Hepatobiliary: No focal liver abnormality is seen. No gallstones, gallbladder wall thickening, or biliary dilatation. Pancreas: Unremarkable. No pancreatic ductal dilatation or surrounding inflammatory changes. Spleen: Normal in size without focal abnormality. Adrenals/Urinary Tract: Adrenal glands are unremarkable. Kidneys are normal, without renal calculi, focal lesion, or hydronephrosis. Bladder is unremarkable. Stomach/Bowel: Stomach is within normal limits. Appendix appears normal. No evidence of bowel wall thickening, distention, or inflammatory changes. Vascular/Lymphatic: No significant vascular findings are present. No enlarged abdominal or pelvic lymph nodes. 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 The left-sided ovary as a 23 mm benign appearing cyst and a small amount of surrounding simple fluid, possibly ruptured ovarian follicle. Other: No abdominal wall hernia or abnormality. No abdominopelvic ascites. Musculoskeletal: No acute or significant osseous findings. IMPRESSION: 1. 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 23 mm  benign-appearing cyst of left ovary with small volume of surrounding simple fluid, possibly ruptured ovarian follicle. 2. Otherwise unremarkable CT of the abdomen and pelvis. Electronically Signed   By: Mitzi HansenLance  Furusawa-Stratton M.D.   On: 05/15/2018 02:38   [place] Pelvic Complete With Transvaginal  Result Date: 05/14/2018 CLINICAL DATA:  Left lower quadrant abdomen pain EXAM: TRANSABDOMINAL AND TRANSVAGINAL ULTRASOUND OF PELVIS TECHNIQUE: Both transabdominal and transvaginal ultrasound examinations of the pelvis were performed. Transabdominal technique was performed for global imaging of the pelvis including uterus, ovaries,  adnexal regions, and pelvic cul-de-sac. It was necessary to proceed with endovaginal exam following the transabdominal exam to visualize the bilateral ovaries. COMPARISON:  None FINDINGS: Uterus Congenitally absent. Endometrium Congenitally absent. Right ovary Not visualized. Left ovary Not visualized. Other findings No abnormal free fluid. Patient states her ovaries are present per prior laparoscopic surgery 5 years ago. 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 Electronically Signed   By: Sherian ReinWei-Chen  Lin M.D.   On: 05/14/2018 11:24    ____________________________________________   PROCEDURES  Procedure(s) performed: None  Procedures  Critical Care performed: No  ____________________________________________   INITIAL IMPRESSION / ASSESSMENT AND PLAN / ED COURSE  As part of my medical decision making, I reviewed the following data within the electronic MEDICAL RECORD NUMBER Nursing notes reviewed and incorporated, Labs reviewed, Old chart reviewed, Radiograph reviewed  and Notes from prior ED visits    23 year old female who presents with left lower quadrant abdominal pain. Differential diagnosis includes, but is not limited to, ovarian cyst, ovarian torsion, acute appendicitis, diverticulitis, urinary tract infection/pyelonephritis, endometriosis, bowel obstruction, colitis, renal colic, gastroenteritis, hernia, fibroids, endometriosis, pregnancy related pain including ectopic pregnancy, etc.   Patient had unremarkable ultrasound at her PCPs office yesterday.  Her continued symptoms of left lower quadrant abdominal pain, nausea and now diarrhea, will proceed with CT abdomen/pelvis to evaluate for intra-abdominal etiology of patient's pain.  Clinical Course as of May 16 255  Sat May 15, 2018  0257 Updated patient on CT results.  She had urinalysis done at her PCPs which was unremarkable.  She is afebrile and feeling significantly better.  Will discharge home  with prescription for Norco.  Strict return precautions given.  Patient verbalizes understanding and agrees with plan of care.   [JS]    Clinical Course User Index [JS] Irean HongSung, Jerrell Hart J, MD     ____________________________________________   FINAL CLINICAL IMPRESSION(S) / ED DIAGNOSES  Final diagnoses:  Left lower quadrant abdominal pain  Cyst of left ovary     ED Discharge Orders    None       Note:  This document was prepared using Dragon voice recognition software and may include unintentional dictation errors.    Irean HongSung, Tillman Kazmierski J, MD 05/15/18 [phone]

## 2018-05-15 NOTE — Discharge Instructions (Addendum)
1.  You may take Ibuprofen as needed for pain, Norco as needed for more severe pain. 2.  Return to the ER for worsening symptoms, persistent vomiting, difficulty breathing or other concerns.

## 2018-05-17 ENCOUNTER — Inpatient Hospital Stay (HOSPITAL_COMMUNITY)
Admission: AD | Admit: 2018-05-17 | Discharge: 2018-05-17 | Disposition: A | Discharge status: Home / Self Care | Payer: BLUE CROSS/BLUE SHIELD | Attending: Obstetrics and Gynecology | Admitting: Obstetrics and Gynecology

## 2018-05-17 ENCOUNTER — Telehealth: Payer: Self-pay

## 2018-05-17 ENCOUNTER — Inpatient Hospital Stay (HOSPITAL_COMMUNITY): Payer: BLUE CROSS/BLUE SHIELD

## 2018-05-17 ENCOUNTER — Encounter (HOSPITAL_COMMUNITY): Payer: Self-pay

## 2018-05-17 DIAGNOSIS — Z8742 Personal history of other diseases of the female genital tract: Secondary | ICD-10-CM | POA: Diagnosis not present

## 2018-05-17 DIAGNOSIS — R109 Unspecified abdominal pain: Secondary | ICD-10-CM | POA: Diagnosis not present

## 2018-05-17 DIAGNOSIS — N83202 Unspecified ovarian cyst, left side: Secondary | ICD-10-CM | POA: Diagnosis not present

## 2018-05-17 HISTORY — DX: Unspecified ovarian cyst, unspecified side: N83.209

## 2018-05-17 LAB — CBC WITH DIFFERENTIAL/PLATELET
Basophils Absolute: 0 10*3/uL (ref 0.0–0.1)
Basophils Relative: 0 %
Eosinophils Absolute: 0.1 10*3/uL (ref 0.0–0.5)
Eosinophils Relative: 1 %
HCT: 41 % (ref 36.0–46.0)
Hemoglobin: 13.6 g/dL (ref 12.0–15.0)
Lymphocytes Relative: 38 %
Lymphs Abs: 4.9 10*3/uL — ABNORMAL HIGH (ref 0.7–4.0)
MCH: 26.5 pg (ref 26.0–34.0)
MCHC: 33.2 g/dL (ref 30.0–36.0)
MCV: 79.8 fL — ABNORMAL LOW (ref 80.0–100.0)
Monocytes Absolute: 0.6 10*3/uL (ref 0.1–1.0)
Monocytes Relative: 5 %
Neutro Abs: 7.2 10*3/uL (ref 1.7–7.7)
Neutrophils Relative %: 56 %
Platelets: 407 10*3/uL — ABNORMAL HIGH (ref 150–400)
RBC: 5.14 MIL/uL — ABNORMAL HIGH (ref 3.87–5.11)
RDW: 14.1 % (ref 11.5–15.5)
WBC: 12.8 10*3/uL — ABNORMAL HIGH (ref 4.0–10.5)
nRBC: 0 % (ref 0.0–0.2)

## 2018-05-17 LAB — URINALYSIS, ROUTINE W REFLEX MICROSCOPIC
Bilirubin Urine: NEGATIVE
Glucose, UA: NEGATIVE mg/dL
Hgb urine dipstick: NEGATIVE
Ketones, ur: NEGATIVE mg/dL
Leukocytes, UA: NEGATIVE
Nitrite: NEGATIVE
Protein, ur: NEGATIVE mg/dL
Specific Gravity, Urine: <1.005 — ABNORMAL LOW (ref 1.005–1.030)
pH: 7 (ref 5.0–8.0)

## 2018-05-17 MED ORDER — KETOROLAC TROMETHAMINE 60 MG/2ML IM SOLN
60.0000 mg | Freq: Once | INTRAMUSCULAR | Status: AC
  Administered 2018-05-17: 60 mg via INTRAMUSCULAR
  Filled 2018-05-17: qty 2

## 2018-05-17 NOTE — MAU Provider Note (Signed)
Chief Complaint: Abdominal Pain   First Provider Initiated Contact with Patient 05/17/18 2038     SUBJECTIVE HPI: Linda Chambers is a 23 y.o. non pregnant female who presents to Maternity Admissions reporting abdominal pain. Has had abdominal pain for the last week in her left mid abdomen to LLQ. Was seen in the ED 2 days ago and found to have a 2.3 cm left ovarian cyst. Was scheduled to f/u with gyn. Her appointment is for this Friday but reports the pain has worsened & her PCP instructed her to come to MAU.  Now reports generalized abdominal pain that is worse in left mid abdomen. Pain worse with moving. Was taking ibuprofen & oxycodone but has not taken anything since yesterday. Some nausea, no vomiting. Initially was having diarrhea with her abdominal pain but has not had a BM since Saturday.  Denies vaginal bleeding, dysuria, or discharge. She has congenitally absent uterus.   Location: abdomen Quality: sharp, shooting, dull Severity: 7/10 on pain scale Duration: 1 week Timing: intermittent Modifying factors: worse with touch and movement. Nothing makes better Associated signs and symptoms: nausea, diarrhea, constipation  Past Medical History:  Diagnosis Date  . Allergic rhinitis   . Anxiety   . Asthma    ?excercise only  . Bipolar 1 disorder (HCC)   . Hypercholesteremia   . Ovarian cyst    OB History  No obstetric history on file.   Past Surgical History:  Procedure Laterality Date  . ABDOMINAL SURGERY    . VAGINOPLASTY    . WISDOM TOOTH EXTRACTION Bilateral    Social History   Socioeconomic History  . Marital status: Single    Spouse name: Not on file  . Number of children: Not on file  . Years of education: Not on file  . Highest education level: Not on file  Occupational History  . Not on file  Social Needs  . Financial resource strain: Not on file  . Food insecurity:    Worry: Not on file    Inability: Not on file  . Transportation needs:    Medical: Not on  file    Non-medical: Not on file  Tobacco Use  . Smoking status: Never Smoker  . Smokeless tobacco: Never Used  Substance and Sexual Activity  . Alcohol use: Never    Frequency: Never  . Drug use: Never  . Sexual activity: Yes    Birth control/protection: Condom  Lifestyle  . Physical activity:    Days per week: Not on file    Minutes per session: Not on file  . Stress: Not on file  Relationships  . Social connections:    Talks on phone: Not on file    Gets together: Not on file    Attends religious service: Not on file    Active member of club or organization: Not on file    Attends meetings of clubs or organizations: Not on file    Relationship status: Not on file  . Intimate partner violence:    Fear of current or ex partner: Not on file    Emotionally abused: Not on file    Physically abused: Not on file    Forced sexual activity: Not on file  Other Topics Concern  . Not on file  Social History Narrative  . Not on file   Family History  Problem Relation Age of Onset  . Bipolar disorder Mother   . Hypertension Father   . Bipolar disorder Maternal Grandfather  No current facility-administered medications on file prior to encounter.    Current Outpatient Medications on File Prior to Encounter  Medication Sig Dispense Refill  . busPIRone (BUSPAR) 5 MG tablet Take 1 tablet by mouth daily.   0  . cetirizine (ZYRTEC) 10 MG tablet Take 1 tablet by mouth daily.    . colesevelam (WELCHOL) 625 MG tablet Take 2 tablets by mouth 2 (two) times daily.    . Cyanocobalamin (VITAMIN B-12 PO) Take by mouth.    Marland Kitchen HYDROcodone-acetaminophen (NORCO) 5-325 MG tablet Take 1 tablet by mouth every 6 (six) hours as needed for moderate pain. 15 tablet 0  . ibuprofen (ADVIL,MOTRIN) 800 MG tablet Take 1 tablet (800 mg total) by mouth every 8 (eight) hours as needed for moderate pain. 15 tablet 0  . Lurasidone HCl (LATUDA) 60 MG TABS Take by mouth.    . spironolactone (ALDACTONE) 50 MG  tablet Take 1 tablet by mouth daily.    . traMADol (ULTRAM) 50 MG tablet Take 1 tablet (50 mg total) by mouth every 8 (eight) hours as needed. 10 tablet 0  . levalbuterol (XOPENEX HFA) 45 MCG/ACT inhaler Inhale 1 puff into the lungs every 4 (four) hours as needed for wheezing.     No Known Allergies  I have reviewed patient's Past Medical Hx, Surgical Hx, Family Hx, Social Hx, medications and allergies.   Review of Systems  Constitutional: Negative.   Gastrointestinal: Positive for abdominal pain, constipation, diarrhea and nausea. Negative for abdominal distention, blood in stool and vomiting.  Genitourinary: Negative.     OBJECTIVE Patient Vitals for the past 24 hrs:  BP Temp Temp src Pulse Resp SpO2 Height Weight  05/17/18 2249 116/78 - - (!) 103 17 - - -  05/17/18 2004 136/80 98.5 F (36.9 C) Oral 94 16 97 % 5\' 4"  (1.626 m) 96.2 kg   Constitutional: Well-developed, well-nourished female in no acute distress.  Cardiovascular: normal rate & rhythm, no murmur Respiratory: normal rate and effort. Lung sounds clear throughout GI: Generalized tenderness throughout abdomen. Abd soft, Pos BS x 4. No guarding or rebound tenderness MS: Extremities nontender, no edema, normal ROM Neurologic: Alert and oriented x 4.      LAB RESULTS Results for orders placed or performed during the hospital encounter of 05/17/18 (from the past 24 hour(s))  Urinalysis, Routine w reflex microscopic     Status: Abnormal   Collection Time: 05/17/18  8:15 PM  Result Value Ref Range   Color, Urine YELLOW YELLOW   APPearance CLEAR CLEAR   Specific Gravity, Urine <1.005 (L) 1.005 - 1.030   pH 7.0 5.0 - 8.0   Glucose, UA NEGATIVE NEGATIVE mg/dL   Hgb urine dipstick NEGATIVE NEGATIVE   Bilirubin Urine NEGATIVE NEGATIVE   Ketones, ur NEGATIVE NEGATIVE mg/dL   Protein, ur NEGATIVE NEGATIVE mg/dL   Nitrite NEGATIVE NEGATIVE   Leukocytes, UA NEGATIVE NEGATIVE  CBC with Differential/Platelet     Status:  Abnormal   Collection Time: 05/17/18  9:02 PM  Result Value Ref Range   WBC 12.8 (H) 4.0 - 10.5 K/uL   RBC 5.14 (H) 3.87 - 5.11 MIL/uL   Hemoglobin 13.6 12.0 - 15.0 g/dL   HCT 19.4 17.4 - 08.1 %   MCV 79.8 (L) 80.0 - 100.0 fL   MCH 26.5 26.0 - 34.0 pg   MCHC 33.2 30.0 - 36.0 g/dL   RDW 44.8 18.5 - 63.1 %   Platelets 407 (H) 150 - 400 K/uL   nRBC  0.0 0.0 - 0.2 %   Neutrophils Relative % 56 %   Neutro Abs 7.2 1.7 - 7.7 K/uL   Lymphocytes Relative 38 %   Lymphs Abs 4.9 (H) 0.7 - 4.0 K/uL   Monocytes Relative 5 %   Monocytes Absolute 0.6 0.1 - 1.0 K/uL   Eosinophils Relative 1 %   Eosinophils Absolute 0.1 0.0 - 0.5 K/uL   Basophils Relative 0 %   Basophils Absolute 0.0 0.0 - 0.1 K/uL    IMAGING [place] Pelvis (transabdominal Only)  Result Date: 05/17/2018 CLINICAL DATA:  23 year old female with left side pelvic pain for 1 week. History of uterine agenesis. Ovaries visible in the lower pericolic gutters on CT recently. EXAM: TRANSABDOMINAL ULTRASOUND OF PELVIS TECHNIQUE: Transabdominal ultrasound examination of the pelvis was performed including evaluation of the uterus, ovaries, adnexal regions, and pelvic cul-de-sac. COMPARISON:  CT Abdomen and Pelvis 05/15/2018. Pelvis ultrasound 05/14/2018. FINDINGS: Uterus Measurements: Absent. Endometrium Thickness: Absent. Right ovary Measurements: The right ovary could not be identified. It is visible on series 2, image 59 of the recent CT. Left ovary Measurements: Estimated at 2.2 x 1.7 x 1.3 centimeters = volume: 2.5 mL. Normal appearance/no adnexal mass. Other findings:  No free fluid identified. IMPRESSION: 1. The left ovary has a normal ultrasound appearance, and the right ovary could not be identified today. 2. 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 Electronically Signed   By: Odessa FlemingH  Hall M.D.   On: 05/17/2018 21:47    MAU COURSE Orders Placed This Encounter   Procedures  . US PELVIS (TRANSABDOMINAL ONLY)  . Urinalysis, Routine w reflex microscopic  . CBC with Differential/Platelet  . Discharge patient   Meds ordered this encounter  Medications  . ketorolac (TORADOL) injection 60 mg    MDM CBC comparable to ED visit 2 days ago. Pelvic ultrasound shows no ovarian cysts.  Toradol given for pain with minimal relief.  Constipation likely d/t narcotics; discussed discontinuing norco & treating the constipation as needed.  Excluded cyst/torsion tonight. Pain likely not gyn related. Had CT 2 days ago that was unremarkable (aside from small simple cyst). Recommend patient to f/u with PCP if pain continues.   ASSESSMENT 1. Abdominal pain in female   2. Hx of ovarian cyst     PLAN Discharge home in stable condition.  Follow-up Information    Henson, [name] L, NP-C. Schedule an appointment as soon as possible for a visit.   Specialty:  Family Medicine Contact information: [address] [place] [id number] [phone]          Allergies as of 05/17/2018   No Known Allergies     Medication List    TAKE these medications   busPIRone 5 MG tablet Commonly known as:  BUSPAR Take 1 tablet by mouth daily.   cetirizine 10 MG tablet Commonly known as:  ZYRTEC Take 1 tablet by mouth daily.   colesevelam 625 MG tablet Commonly known as:  WELCHOL Take 2 tablets by mouth 2 (two) times daily.   HYDROcodone-acetaminophen 5-325 MG tablet Commonly known as:  NORCO Take 1 tablet by mouth every 6 (six) hours as needed for moderate pain.   ibuprofen 800 MG tablet Commonly known as:  ADVIL,MOTRIN Take 1 tablet (800 mg total) by mouth every 8 (eight) hours as needed for moderate pain.   LATUDA 60 MG Tabs Generic drug:  Lurasidone HCl Take by mouth.   levalbuterol 45 MCG/ACT  inhaler Commonly known as:  XOPENEX HFA Inhale 1 puff into the lungs every 4 (four) hours as needed for wheezing.   spironolactone 50 MG tablet Commonly  known as:  ALDACTONE Take 1 tablet by mouth daily.   traMADol 50 MG tablet Commonly known as:  ULTRAM Take 1 tablet (50 mg total) by mouth every 8 (eight) hours as needed.   VITAMIN B-12 PO Take by mouth.        Judeth Horn, NP 05/18/2018  2:23 AM

## 2018-05-17 NOTE — Discharge Instructions (Signed)
Abdominal Pain, Adult  Abdominal pain can be caused by many things. Often, abdominal pain is not serious and it gets better with no treatment or by being treated at home. However, sometimes abdominal pain is serious. Your health care provider will do a medical history and a physical exam to try to determine the cause of your abdominal pain.  Follow these instructions at home:   Take over-the-counter and prescription medicines only as told by your health care provider. Do not take a laxative unless told by your health care provider.   Drink enough fluid to keep your urine clear or pale yellow.   Watch your condition for any changes.   Keep all follow-up visits as told by your health care provider. This is important.  Contact a health care provider if:   Your abdominal pain changes or gets worse.   You are not hungry or you lose weight without trying.   You are constipated or have diarrhea for more than 2-3 days.   You have pain when you urinate or have a bowel movement.   Your abdominal pain wakes you up at night.   Your pain gets worse with meals, after eating, or with certain foods.   You are throwing up and cannot keep anything down.   You have a fever.  Get help right away if:   Your pain does not go away as soon as your health care provider told you to expect.   You cannot stop throwing up.   Your pain is only in areas of the abdomen, such as the right side or the left lower portion of the abdomen.   You have bloody or black stools, or stools that look like tar.   You have severe pain, cramping, or bloating in your abdomen.   You have signs of dehydration, such as:  ? Dark urine, very little urine, or no urine.  ? Cracked lips.  ? Dry mouth.  ? Sunken eyes.  ? Sleepiness.  ? Weakness.  This information is not intended to replace advice given to you by your health care provider. Make sure you discuss any questions you have with your health care provider.  Document Released: 01/08/2005 Document  Revised: 10/19/2015 Document Reviewed: 09/12/2015  Elsevier Interactive Patient Education  2019 [employer] Inc[employer]

## 2018-05-17 NOTE — Telephone Encounter (Signed)
She may check with another gynecology office to see about getting in sooner if she would like. If her pain is not improved with ibuprofen and hydrocodone then she may have to go the [facility] to be evaluated sooner. She can try using heat if she isn't.

## 2018-05-17 NOTE — Telephone Encounter (Signed)
Patient was informed of provider note. She stated she is in a lot of pain so she will try to go to the hospital.

## 2018-05-17 NOTE — Telephone Encounter (Signed)
Patient called and stated she was seen at the ED 04/14/2018 and diagnosed with an ovarian cyst. She was given Ibuprofen 800mg  and Hydrocodone 325 but it is not helping with the pain. [facility] can't get [facility] in for an appt until 05/21/18. Any recommendations?

## 2018-05-17 NOTE — MAU Note (Signed)
Pt reports she was in the ED on Friday night and was told she had an ovarian cyst on left ovary. Reports she was prescribed hydrocodone and ibuprofen for pain but neither are working. Reports she took last took ibuprofen this morning at 11:30a, but has not taken anymore since then because they make her sleepy and she was in class all day. Reports that yesterday when she took them on schedule, it did not help. Reports the hydrocodone helps because she is able to sleep. Reports waves of nausea and cold sweats. Pt denies vaginal bleeding or discharge. 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

## 2018-05-19 ENCOUNTER — Encounter: Payer: Self-pay | Admitting: Family Medicine

## 2018-05-19 ENCOUNTER — Ambulatory Visit (INDEPENDENT_AMBULATORY_CARE_PROVIDER_SITE_OTHER): Payer: BLUE CROSS/BLUE SHIELD | Admitting: Family Medicine

## 2018-05-19 VITALS — BP 118/64 | HR 88 | Temp 98.7°F

## 2018-05-19 DIAGNOSIS — Z09 Encounter for follow-up examination after completed treatment for conditions other than malignant neoplasm: Secondary | ICD-10-CM

## 2018-05-19 DIAGNOSIS — E781 Pure hyperglyceridemia: Secondary | ICD-10-CM | POA: Insufficient documentation

## 2018-05-19 DIAGNOSIS — R1032 Left lower quadrant pain: Secondary | ICD-10-CM | POA: Diagnosis not present

## 2018-05-19 DIAGNOSIS — D72829 Elevated white blood cell count, unspecified: Secondary | ICD-10-CM | POA: Diagnosis not present

## 2018-05-19 NOTE — Progress Notes (Addendum)
   Subjective:    Patient ID: Erling ConteRachel Bortner, female    DOB: 09/05/1995, 23 y.o.   MRN: [id number]  HPI Chief Complaint  Patient presents with  . abdominal pain    abdominal pain- not as bad, her dad was wondering if its GI related   Here to follow up on left lower quadrant pain. Pain has improved. still worse with movement or touching the area. Rates pain 3/10.  Denies any new symptoms.   Taking ibuprofen randomly now for pain control.   She was constipated while taking hydrocodone but states her bowel movements are normal again. No blood or pus.   Denies fever, chills, N/V/D. Good appetite.   Recent pelvic US showed left ovarian cyst then a repeat US yesterday showed resolution of the cyst. Negative CT abdomen.   Requests lipid panel and is fasting. She has been taking Welchol for a couple of years and switched to generic 2 weeks ago. States her Latuda increased her triglycerides.    Review of Systems Pertinent positives and negatives in the history of present illness.     Objective:   Physical Exam BP 118/64   Pulse 88   Temp 98.7 F (37.1 C) (Oral)   Alert and oriented and in no acute distress.  Normal work of breathing.  Skin is warm and dry.  Speech, mood and thought process are all normal.     Assessment & Plan:  LLQ pain  Follow up  Leukocytosis, unspecified type - Plan: CBC with Differential/Platelet  Hypertriglyceridemia - Plan: Lipid panel  Her symptoms are improving.  Taking ibuprofen as needed.  No red flag symptoms.  Recheck CBC.  Follow-up if pain worsens again or if she has any new symptoms. She has been taking WelChol for hyper triglyceridemia related to JordanLatuda.  Check lipid panel and refill as appropriate.  She is fasting.

## 2018-05-20 LAB — LIPID PANEL
Chol/HDL Ratio: 4.6 ratio — ABNORMAL HIGH (ref 0.0–4.4)
Cholesterol, Total: 212 mg/dL — ABNORMAL HIGH (ref 100–199)
HDL: 46 mg/dL (ref >39)
LDL Calculated: 121 mg/dL — ABNORMAL HIGH (ref 0–99)
Triglycerides: 226 mg/dL — ABNORMAL HIGH (ref 0–149)
VLDL Cholesterol Cal: 45 mg/dL — ABNORMAL HIGH (ref 5–40)

## 2018-05-20 LAB — CBC WITH DIFFERENTIAL/PLATELET
Basophils Absolute: 0 10*3/uL (ref 0.0–0.2)
Basos: 0 %
EOS (ABSOLUTE): 0.1 10*3/uL (ref 0.0–0.4)
Eos: 2 %
Hematocrit: 42.3 % (ref 34.0–46.6)
Hemoglobin: 13.9 g/dL (ref 11.1–15.9)
Immature Grans (Abs): 0.1 10*3/uL (ref 0.0–0.1)
Immature Granulocytes: 1 %
Lymphocytes Absolute: 2.6 10*3/uL (ref 0.7–3.1)
Lymphs: 29 %
MCH: 26.3 pg — ABNORMAL LOW (ref 26.6–33.0)
MCHC: 32.9 g/dL (ref 31.5–35.7)
MCV: 80 fL (ref 79–97)
Monocytes Absolute: 0.6 10*3/uL (ref 0.1–0.9)
Monocytes: 7 %
Neutrophils Absolute: 5.4 10*3/uL (ref 1.4–7.0)
Neutrophils: 61 %
Platelets: 420 10*3/uL (ref 150–450)
RBC: 5.29 x10E6/uL — ABNORMAL HIGH (ref 3.77–5.28)
RDW: 14 % (ref 11.7–15.4)
WBC: 8.8 10*3/uL (ref 3.4–10.8)

## 2018-05-21 ENCOUNTER — Encounter: Payer: Self-pay | Admitting: Obstetrics & Gynecology

## 2018-05-21 ENCOUNTER — Ambulatory Visit (INDEPENDENT_AMBULATORY_CARE_PROVIDER_SITE_OTHER): Payer: BLUE CROSS/BLUE SHIELD | Admitting: Obstetrics & Gynecology

## 2018-05-21 VITALS — BP 130/90 | Ht 64.0 in | Wt 210.0 lb

## 2018-05-21 DIAGNOSIS — Q8789 Other specified congenital malformation syndromes, not elsewhere classified: Secondary | ICD-10-CM

## 2018-05-21 DIAGNOSIS — Q528 Other specified congenital malformations of female genitalia: Secondary | ICD-10-CM

## 2018-05-21 DIAGNOSIS — R1032 Left lower quadrant pain: Secondary | ICD-10-CM | POA: Diagnosis not present

## 2018-05-21 DIAGNOSIS — Z8742 Personal history of other diseases of the female genital tract: Secondary | ICD-10-CM | POA: Diagnosis not present

## 2018-05-21 NOTE — Progress Notes (Signed)
  HPI: Patient is a 23 y.o. G0. 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  She complains of recent findings of Left ovarion cyst by CT - Pelvis.  This was followed by an [place] 2 days later w resolution (or rupture) of this cyst.  Pt has had symptoms of pain.  Pain has been one week, moderate in LLQ, no radiation, modified by rest and IBF, no assoc sx's.  Pt has had previous vagino-plasty constructive surgery and is sexually active.  No prior h/o pain or cysts.  Concerned about future managament of cysts and pain.  She is known BRCA - 2019.  FH breast cancer M Aunt, MGM, MGGM.  PMHx: She  has a past medical history of Allergic rhinitis, Anxiety, Asthma, Bipolar 1 disorder ([place]), Hypercholesteremia, and Ovarian cyst. Also,  has a past surgical history that includes Abdominal surgery; Wisdom tooth extraction (Bilateral); and Vaginoplasty., family history includes Bipolar disorder in her maternal grandfather and mother; Hypertension in her father.,  reports that she has never smoked. She has never used smokeless tobacco. She reports that she does not drink alcohol or use drugs.  She has a current medication list which includes the following prescription(s): buspirone, cetirizine, colesevelam, cyanocobalamin, levalbuterol, lurasidone hcl, spironolactone, and ibuprofen. Also, has No Known Allergies.  Review of Systems  Constitutional: Negative for chills, fever and malaise/fatigue.  HENT: Negative for congestion, sinus pain and sore throat.   Eyes: Negative for blurred vision and pain.  Respiratory: Negative for cough and wheezing.   Cardiovascular: Negative for chest pain and leg swelling.  Gastrointestinal: Negative for abdominal pain, constipation, diarrhea, heartburn, nausea and vomiting.  Genitourinary: Negative for dysuria, frequency, hematuria and urgency.  Musculoskeletal: Negative for back pain, joint pain, myalgias and neck pain.  Skin: Negative for itching and  rash.  Neurological: Negative for dizziness, tremors and weakness.  Endo/Heme/Allergies: Does not bruise/bleed easily.  Psychiatric/Behavioral: Negative for depression. The patient is not nervous/anxious and does not have insomnia.    Objective: BP 130/90   Ht _0  (1.626 m)   Wt 210 lb (95.3 kg)   BMI 36.05 kg/m  Physical Exam Constitutional:      General: She is not in acute distress.    Appearance: She is well-developed.  Musculoskeletal: Normal range of motion.  Neurological:     Mental Status: She is alert and oriented to person, place, and time.  Skin:    General: Skin is warm and dry.  Vitals signs reviewed.   ASSESSMENT/PLAN:    Problem List Items Addressed This Visit      Genitourinary   Mayer-Rokitansky-Kuster-Hauser syndrome     Other   Hx of ovarian cyst - Primary    Other Visit Diagnoses    LLQ pain        Monitor for recurrence of pain, potential for cysts Chronic suppressive therapy only if recurrent Egg preservation for future surrogacy discussed. BRCA Neg, no further testing needed now.  A total of 60 minutes were spent face-to-face with the patient during this encounter and over half of that time dealt with counseling and coordination of care.  Barnett Applebaum, MD, [facility], [facility] 05/21/2018  3:51 PM

## 2018-06-10 ENCOUNTER — Encounter: Payer: BLUE CROSS/BLUE SHIELD | Admitting: Family Medicine

## 2018-10-27 ENCOUNTER — Ambulatory Visit (INDEPENDENT_AMBULATORY_CARE_PROVIDER_SITE_OTHER): Payer: BLUE CROSS/BLUE SHIELD | Admitting: Family Medicine

## 2018-10-27 ENCOUNTER — Encounter: Payer: Self-pay | Admitting: Family Medicine

## 2018-10-27 ENCOUNTER — Other Ambulatory Visit: Payer: Self-pay

## 2018-10-27 VITALS — HR 80 | Temp 97.7°F | Wt 215.0 lb

## 2018-10-27 DIAGNOSIS — Z8669 Personal history of other diseases of the nervous system and sense organs: Secondary | ICD-10-CM | POA: Diagnosis not present

## 2018-10-27 DIAGNOSIS — R0681 Apnea, not elsewhere classified: Secondary | ICD-10-CM | POA: Diagnosis not present

## 2018-10-27 DIAGNOSIS — R0683 Snoring: Secondary | ICD-10-CM | POA: Diagnosis not present

## 2018-10-27 DIAGNOSIS — G4719 Other hypersomnia: Secondary | ICD-10-CM

## 2018-10-27 DIAGNOSIS — L700 Acne vulgaris: Secondary | ICD-10-CM

## 2018-10-27 NOTE — Progress Notes (Addendum)
   Subjective:   Documentation for virtual audio and video telecommunications through [place] encounter:  The patient was located at home. 2 patient identifiers used.  The provider was located in at home.  The patient did consent to this visit and is aware of possible charges through their insurance for this visit.  The other persons participating in this telemedicine service were none.    Patient ID: Linda Chambers, female    DOB: 1995/05/01, 23 y.o.   MRN: [id number]  HPI Chief Complaint  Patient presents with  . sleep issues    sleep issues been going on since a child. had sleep apnea as a child and was on cpap machine until 17 years old. having issues now- snoring, gasping for air, fatigue in the morning   States she has a history of sleep apnea as a child and uses a CPAP. States she had to stop using it when she turned 20 and has not followed up to get a new one. States she has been told by her mother recently that she stops breathing when she is asleep. States she wakes up with headaches and is very tired during the day.   States she wakes up with a dry throat and thinks. She would like to be tested to see if she still has OSA and be treated if so.   Denies fever, chills, dizziness, chest pain, palpitations, shortness of breath, abdominal pain, N/V/D, urinary symptoms, LE edema.   States she has been on spironolactone for the past 3 years. This was started by her pediatrician in [place] for acne. States she has been using topical treatment and this has helped more than the spironolactone. Requests to stop it. Would like my input.   Reviewed allergies, medications, past medical, surgical, family, and social history.   Review of Systems Pertinent positives and negatives in the history of present illness.     Objective:   Physical Exam Pulse 80   Temp 97.7 F (36.5 C) (Oral)   Wt 215 lb (97.5 kg)   BMI 36.90 kg/m   Alert and oriented and in no acute distress. Respirations  unlabored. Normal speech, mood and thought process.       Assessment & Plan:  History of sleep apnea - Plan: Home sleep test, she has not used a CPAP since age 61 due to insurance no requiring her to get a new sleep test. She has not discussed this with me until today. Suspect she still has OSA and will need a sleep study to determine. Follow up pending results of sleep study.   Excessive daytime sleepiness - Plan: Home sleep test, suspect this is related to sleep apnea  Witnessed episode of apnea - Plan: Home sleep test, suspect she needs CPAP since she used one as a child  Snoring - Plan: Home sleep test  Acne vulgaris - Plan: she would like to stop spironolactone which she has been on for the past 3 years. This was started by her pediatrician in [place]. She no longer thinks it helps and she is using a topical medication now that seems to be working. Ok to wean off the medication.   Time spent on call was 16 minutes and in review of previous records 2 minutes total.  This virtual service is not related to other E/M service within previous 7 days.

## 2018-11-09 ENCOUNTER — Telehealth: Payer: Self-pay | Admitting: Family Medicine

## 2018-11-09 NOTE — Telephone Encounter (Signed)
[place] [facility] will be reaching out to patient today to get scheduled.

## 2018-11-09 NOTE — Telephone Encounter (Signed)
Pt states she still has not heard anything about the home sleep study,  I see where vickie put in notes and order to [facility].  Will send to [facility] for follow up

## 2018-11-09 NOTE — Telephone Encounter (Signed)
Patient has been informed.

## 2018-12-13 ENCOUNTER — Encounter: Payer: Self-pay | Admitting: Family Medicine

## 2018-12-13 ENCOUNTER — Other Ambulatory Visit: Payer: Self-pay

## 2018-12-13 ENCOUNTER — Ambulatory Visit (INDEPENDENT_AMBULATORY_CARE_PROVIDER_SITE_OTHER): Payer: BLUE CROSS/BLUE SHIELD | Admitting: Family Medicine

## 2018-12-13 VITALS — HR 82 | Temp 97.3°F | Ht 64.0 in | Wt 215.0 lb

## 2018-12-13 DIAGNOSIS — J309 Allergic rhinitis, unspecified: Secondary | ICD-10-CM

## 2018-12-13 DIAGNOSIS — J029 Acute pharyngitis, unspecified: Secondary | ICD-10-CM

## 2018-12-13 NOTE — Progress Notes (Signed)
Start time: 12:17 End time: 12:29 Virtual Visit via Video Note  I connected with Erling ConteRachel Harding on 12/13/18 by a video enabled telemedicine application and verified that I am speaking with the correct person using two identifiers.  Location: Patient: home, alone Provider: office   I discussed the limitations of evaluation and management by telemedicine and the availability of in person appointments. The patient expressed understanding and agreed to proceed.  History of Present Illness: Chief Complaint  Patient presents with  . Sore Throat    for the past couple of days and sneezing.   She attends [employer].  She has had sore throat for a few days and sneezing. She usually has allergies this time of year. Allergies tend to be worse in [place] than they were in her home state of [place]. She has been back on campus since 12/01/2018. She takes zyrtec year-round, under the care of an allergist.  She uses sudafed prn for sore throat also. Sore throat gets a little better later in the day, worse when she first wakes up. She has never used nasal steroid sprays. She goes to campus once a week, for a class with 12 people, and has class today.  No known COVID exposures. Boyfriend sometimes stays with her, he has not been sick or exposed.  PMH, PSH, SH reviewed Outpatient Encounter Medications as of 12/13/2018  Medication Sig  . busPIRone (BUSPAR) 10 MG tablet Take 10 mg by mouth 3 (three) times daily.  . cetirizine (ZYRTEC) 10 MG tablet Take 1 tablet by mouth daily.  . colesevelam (WELCHOL) 625 MG tablet Take 2 tablets by mouth 2 (two) times daily.  . Cyanocobalamin (VITAMIN B-12 PO) Take by mouth.  . Lurasidone HCl (LATUDA) 60 MG TABS Take by mouth.  . [DISCONTINUED] busPIRone (BUSPAR) 5 MG tablet Take 1 tablet by mouth daily.   Marland Kitchen. levalbuterol (XOPENEX HFA) 45 MCG/ACT inhaler Inhale 1 puff into the lungs every 4 (four) hours as needed for wheezing.  . [DISCONTINUED] ibuprofen (ADVIL,MOTRIN) 800 MG tablet Take  1 tablet (800 mg total) by mouth every 8 (eight) hours as needed for moderate pain. (Patient not taking: Reported on 10/27/2018)  . [DISCONTINUED] spironolactone (ALDACTONE) 50 MG tablet Take 1 tablet by mouth daily.   No facility-administered encounter medications on file as of 12/13/2018.    ROS: no fever, chills. No myalgias, fatigue, cough, GI symptoms, headaches, dizziness, rashes.  Just sore throat and sneezing per HPI.    Observations/Objective:  Pulse 82   Temp (!) 97.3 F (36.3 C) (Oral)   Ht 5\' 4"  (1.626 m)   Wt 215 lb (97.5 kg)   BMI 36.90 kg/m   Alert, oriented, well-appearing female in no distress Speaking easily in full sentences, no coughing or sneezing during visit. EOMI, conjunctiva clear Exam limited due to virtual nature of the visit.  Assessment and Plan:  Allergic rhinitis, unspecified seasonality, unspecified trigger  Sore throat - worse in mornings, suspect related to PND from allergies  Educated regarding s/sx COVID (and to isolate an get tested if they develop).  Add sudafed prn Cont zyrtec May need nasal steroids or additional meds if allergies aren't adequately controlled with zyrtec and sudafed.    Follow Up Instructions:    I discussed the assessment and treatment plan with the patient. The patient was provided an opportunity to ask questions and all were answered. The patient agreed with the plan and demonstrated an understanding of the instructions.   The patient was advised to call  back or seek an in-person evaluation if the symptoms worsen or if the condition fails to improve as anticipated.  I provided 12 minutes of non-face-to-face time during this encounter.   Vikki Ports, MD

## 2018-12-13 NOTE — Patient Instructions (Addendum)
Your symptoms sound like typical allergies.  As you know, the symptoms of COVID can be wide and varied, and can change. See information below regarding COVID.  Continue your daily Zyrtec; use sudafed as needed to help with the drainage (which contributes to sore throat). You may use tylenol and salt water gargles if needed for sore throat.  If allergies aren't well controlled with these measures, then we may need to adjust your allergy regimen (ie to include a nasal steroid spray such as Flonase).  Have a happy birthday!!   COVID-19 COVID-19 is a respiratory infection that is caused by a virus called severe acute respiratory syndrome coronavirus 2 (SARS-CoV-2). The disease is also known as coronavirus disease or novel coronavirus. In some people, the virus may not cause any symptoms. In others, it may cause a serious infection. The infection can get worse quickly and can lead to complications, such as:  Pneumonia, or infection of the lungs.  Acute respiratory distress syndrome or ARDS. This is fluid build-up in the lungs.  Acute respiratory failure. This is a condition in which there is not enough oxygen passing from the lungs to the body.  Sepsis or septic shock. This is a serious bodily reaction to an infection.  Blood clotting problems.  Secondary infections due to bacteria or fungus. The virus that causes COVID-19 is contagious. This means that it can spread from person to person through droplets from coughs and sneezes (respiratory secretions). What are the causes? This illness is caused by a virus. You may catch the virus by:  Breathing in droplets from an infected person's cough or sneeze.  Touching something, like a table or a doorknob, that was exposed to the virus (contaminated) and then touching your mouth, nose, or eyes. What increases the risk? Risk for infection You are more likely to be infected with this virus if you:  Live in or travel to an area with a COVID-19  outbreak.  Come in contact with a sick person who recently traveled to an area with a COVID-19 outbreak.  Provide care for or live with a person who is infected with COVID-19. Risk for serious illness You are more likely to become seriously ill from the virus if you:  Are 21 years of age or older.  Have a long-term disease that lowers your body's ability to fight infection (immunocompromised).  Live in a nursing home or long-term care facility.  Have a long-term (chronic) disease such as: ? Chronic lung disease, including chronic obstructive pulmonary disease or asthma ? Heart disease. ? Diabetes. ? Chronic kidney disease. ? Liver disease.  Are obese. What are the signs or symptoms? Symptoms of this condition can range from mild to severe. Symptoms may appear any time from 2 to 14 days after being exposed to the virus. They include:  A fever.  A cough.  Difficulty breathing.  Chills.  Muscle pains.  A sore throat.  Loss of taste or smell. Some people may also have stomach problems, such as nausea, vomiting, or diarrhea. Other people may not have any symptoms of COVID-19. How is this diagnosed? This condition may be diagnosed based on:  Your signs and symptoms, especially if: ? You live in an area with a COVID-19 outbreak. ? You recently traveled to or from an area where the virus is common. ? You provide care for or live with a person who was diagnosed with COVID-19.  A physical exam.  Lab tests, which may include: ? A nasal swab  to take a sample of fluid from your nose. ? A throat swab to take a sample of fluid from your throat. ? A sample of mucus from your lungs (sputum). ? Blood tests.  Imaging tests, which may include, X-rays, CT scan, or ultrasound. How is this treated? At present, there is no medicine to treat COVID-19. Medicines that treat other diseases are being used on a trial basis to see if they are effective against COVID-19. Your health care  provider will talk with you about ways to treat your symptoms. For most people, the infection is mild and can be managed at home with rest, fluids, and over-the-counter medicines. Treatment for a serious infection usually takes places in a hospital intensive care unit (ICU). It may include one or more of the following treatments. These treatments are given until your symptoms improve.  Receiving fluids and medicines through an IV.  Supplemental oxygen. Extra oxygen is given through a tube in the nose, a face mask, or a hood.  Positioning you to lie on your stomach (prone position). This makes it easier for oxygen to get into the lungs.  Continuous positive airway pressure (CPAP) or bi-level positive airway pressure (BPAP) machine. This treatment uses mild air pressure to keep the airways open. A tube that is connected to a motor delivers oxygen to the body.  Ventilator. This treatment moves air into and out of the lungs by using a tube that is placed in your windpipe.  Tracheostomy. This is a procedure to create a hole in the neck so that a breathing tube can be inserted.  Extracorporeal membrane oxygenation (ECMO). This procedure gives the lungs a chance to recover by taking over the functions of the heart and lungs. It supplies oxygen to the body and removes carbon dioxide. Follow these instructions at home: Lifestyle  If you are sick, stay home except to get medical care. Your health care provider will tell you how long to stay home. Call your health care provider before you go for medical care.  Rest at home as told by your health care provider.  Do not use any products that contain nicotine or tobacco, such as cigarettes, e-cigarettes, and chewing tobacco. If you need help quitting, ask your health care provider.  Return to your normal activities as told by your health care provider. Ask your health care provider what activities are safe for you. General instructions  Take  over-the-counter and prescription medicines only as told by your health care provider.  Drink enough fluid to keep your urine pale yellow.  Keep all follow-up visits as told by your health care provider. This is important. How is this prevented?  There is no vaccine to help prevent COVID-19 infection. However, there are steps you can take to protect yourself and others from this virus. To protect yourself:   Do not travel to areas where COVID-19 is a risk. The areas where COVID-19 is reported change often. To identify high-risk areas and travel restrictions, check the CDC travel website: FatFares.com.br  If you live in, or must travel to, an area where COVID-19 is a risk, take precautions to avoid infection. ? Stay away from people who are sick. ? Wash your hands often with soap and water for 20 seconds. If soap and water are not available, use an alcohol-based hand sanitizer. ? Avoid touching your mouth, face, eyes, or nose. ? Avoid going out in public, follow guidance from your state and local health authorities. ? If you must go  out in public, wear a cloth face covering or face mask. ? Disinfect objects and surfaces that are frequently touched every day. This may include:  Counters and tables.  Doorknobs and light switches.  Sinks and faucets.  Electronics, such as phones, remote controls, keyboards, computers, and tablets. To protect others: If you have symptoms of COVID-19, take steps to prevent the virus from spreading to others.  If you think you have a COVID-19 infection, contact your health care provider right away. Tell your health care team that you think you may have a COVID-19 infection.  Stay home. Leave your house only to seek medical care. Do not use public transport.  Do not travel while you are sick.  Wash your hands often with soap and water for 20 seconds. If soap and water are not available, use alcohol-based hand sanitizer.  Stay away from  other members of your household. Let healthy household members care for children and pets, if possible. If you have to care for children or pets, wash your hands often and wear a mask. If possible, stay in your own room, separate from others. Use a different bathroom.  Make sure that all people in your household wash their hands well and often.  Cough or sneeze into a tissue or your sleeve or elbow. Do not cough or sneeze into your hand or into the air.  Wear a cloth face covering or face mask. Where to find more information  Centers for Disease Control and Prevention: StickerEmporium.tnwww.cdc.gov/coronavirus/2019-ncov/index.html  World Health Organization: https://thompson-craig.com/www.who.int/health-topics/coronavirus Contact a health care provider if:  You live in or have traveled to an area where COVID-19 is a risk and you have symptoms of the infection.  You have had contact with someone who has COVID-19 and you have symptoms of the infection. Get help right away if:  You have trouble breathing.  You have pain or pressure in your chest.  You have confusion.  You have bluish lips and fingernails.  You have difficulty waking from sleep.  You have symptoms that get worse. These symptoms may represent a serious problem that is an emergency. Do not wait to see if the symptoms will go away. Get medical help right away. Call your local emergency services (911 in the [place] Do not drive yourself to the hospital. Let the emergency medical personnel know if you think you have COVID-19. Summary  COVID-19 is a respiratory infection that is caused by a virus. It is also known as coronavirus disease or novel coronavirus. It can cause serious infections, such as pneumonia, acute respiratory distress syndrome, acute respiratory failure, or sepsis.  The virus that causes COVID-19 is contagious. This means that it can spread from person to person through droplets from coughs and sneezes.  You are more likely to develop a serious  illness if you are 21 years of age or older, have a weak immunity, live in a nursing home, or have chronic disease.  There is no medicine to treat COVID-19. Your health care provider will talk with you about ways to treat your symptoms.  Take steps to protect yourself and others from infection. Wash your hands often and disinfect objects and surfaces that are frequently touched every day. Stay away from people who are sick and wear a mask if you are sick. This information is not intended to replace advice given to you by your health care provider. Make sure you discuss any questions you have with your health care provider. Document Released: 05/06/2018 Document Revised:  08/26/2018 Document Reviewed: 05/06/2018 Elsevier Patient Education  2020 [employer]

## 2019-01-21 ENCOUNTER — Other Ambulatory Visit: Payer: Self-pay

## 2019-01-21 ENCOUNTER — Ambulatory Visit (HOSPITAL_BASED_OUTPATIENT_CLINIC_OR_DEPARTMENT_OTHER): Payer: BLUE CROSS/BLUE SHIELD | Attending: Family Medicine | Admitting: Internal Medicine

## 2019-01-21 DIAGNOSIS — Z8669 Personal history of other diseases of the nervous system and sense organs: Secondary | ICD-10-CM | POA: Diagnosis not present

## 2019-01-21 DIAGNOSIS — R0683 Snoring: Secondary | ICD-10-CM | POA: Diagnosis not present

## 2019-01-21 DIAGNOSIS — R0681 Apnea, not elsewhere classified: Secondary | ICD-10-CM | POA: Insufficient documentation

## 2019-01-21 DIAGNOSIS — G4719 Other hypersomnia: Secondary | ICD-10-CM | POA: Diagnosis not present

## 2019-02-02 ENCOUNTER — Encounter: Payer: Self-pay | Admitting: Family Medicine

## 2019-02-02 ENCOUNTER — Other Ambulatory Visit: Payer: Self-pay | Admitting: Internal Medicine

## 2019-02-02 ENCOUNTER — Telehealth: Payer: Self-pay | Admitting: Family Medicine

## 2019-02-02 DIAGNOSIS — Z8669 Personal history of other diseases of the nervous system and sense organs: Secondary | ICD-10-CM

## 2019-02-02 DIAGNOSIS — G4733 Obstructive sleep apnea (adult) (pediatric): Secondary | ICD-10-CM | POA: Insufficient documentation

## 2019-02-02 HISTORY — DX: Obstructive sleep apnea (adult) (pediatric): G47.33

## 2019-02-02 NOTE — Procedures (Signed)
    Patient Name: Linda Chambers, Linda Chambers Date: 01/21/2019 Gender: Female D.O.B: [birth year] Age (years): 23 Referring Provider: Girtha Rm NP Height (inches): 76 Interpreting Physician: Baird Lyons MD, ABSM Weight (lbs): 215 RPSGT: Jacolyn Reedy BMI: 37 MRN: [id number] Neck Size: 16.50  CLINICAL INFORMATION Sleep Study Type: HST Indication for sleep study: Excessive Daytime Sleepiness, Snoring, Witnessed Apneas Epworth Sleepiness Score: 4  SLEEP STUDY TECHNIQUE A multi-channel overnight portable sleep study was performed. The channels recorded were: nasal airflow, thoracic respiratory movement, and oxygen saturation with a pulse oximetry. Snoring was also monitored.  MEDICATIONS Patient self administered medications include: none reported.  SLEEP ARCHITECTURE Patient was studied for 620 minutes. The sleep efficiency was 100.0 % and the patient was supine for 88%. The arousal index was 0.0 per hour.  RESPIRATORY PARAMETERS The overall AHI was 21.2 per hour, with a central apnea index of 0.0 per hour. The oxygen nadir was 87% during sleep.  CARDIAC DATA Mean heart rate during sleep was 79.2 bpm.  IMPRESSIONS - Moderate obstructive sleep apnea occurred during this study (AHI = 21.2/h). - No significant central sleep apnea occurred during this study (CAI = 0.0/h). - Mild oxygen desaturation was noted during this study (Min O2 = 87%). Mean sat 97%. - Patient snored.  DIAGNOSIS - Obstructive Sleep Apnea (327.23 [G47.33 ICD-10])  RECOMMENDATIONS - Suggest CPAP titration sleep study or autopap. Other options would be based on clinical judgment. - Be careful with alcohol, sedatives and other CNS depressants that may worsen sleep apnea and disrupt normal sleep architecture. - Sleep hygiene should be reviewed to assess factors that may improve sleep quality. - Weight management and regular exercise should be initiated or continued.  [Electronically signed] 02/02/2019  02:11 PM  Baird Lyons MD, [place], American Board of Sleep Medicine   NPI: [id number]                         [place] [place] [facility]  ELECTRONICALLY SIGNED ON:  02/02/2019, 2:09 PM [place] PH: [phone]   FX: [phone] [place]

## 2019-02-02 NOTE — Telephone Encounter (Signed)
Pt received sleep study results in MyChart but she has questions and would like someone to call her call

## 2019-02-02 NOTE — Telephone Encounter (Signed)
Will call pt about sleep study

## 2019-02-19 ENCOUNTER — Other Ambulatory Visit (HOSPITAL_COMMUNITY)
Admission: RE | Admit: 2019-02-19 | Discharge: 2019-02-19 | Disposition: A | Discharge status: Home / Self Care | Payer: BLUE CROSS/BLUE SHIELD | Source: Ambulatory Visit | Attending: Internal Medicine | Admitting: Internal Medicine

## 2019-02-19 DIAGNOSIS — Z20828 Contact with and (suspected) exposure to other viral communicable diseases: Secondary | ICD-10-CM | POA: Insufficient documentation

## 2019-02-19 DIAGNOSIS — Z01812 Encounter for preprocedural laboratory examination: Secondary | ICD-10-CM | POA: Insufficient documentation

## 2019-02-19 LAB — SARS CORONAVIRUS 2 (TAT 6-24 HRS): SARS Coronavirus 2: NEGATIVE

## 2019-02-21 ENCOUNTER — Other Ambulatory Visit: Payer: Self-pay

## 2019-02-21 ENCOUNTER — Ambulatory Visit (HOSPITAL_BASED_OUTPATIENT_CLINIC_OR_DEPARTMENT_OTHER): Payer: BLUE CROSS/BLUE SHIELD | Attending: Family Medicine | Admitting: Internal Medicine

## 2019-02-21 VITALS — Ht 64.0 in | Wt 220.0 lb

## 2019-02-21 DIAGNOSIS — G4733 Obstructive sleep apnea (adult) (pediatric): Secondary | ICD-10-CM | POA: Insufficient documentation

## 2019-02-26 DIAGNOSIS — G4733 Obstructive sleep apnea (adult) (pediatric): Secondary | ICD-10-CM | POA: Diagnosis not present

## 2019-02-26 NOTE — Procedures (Signed)
Patient Name: Linda Chambers, Linda Chambers Date: 02/21/2019 Gender: Female D.O.B: Jan 24, 1996 Age (years): 23 Referring Provider: Girtha Rm NP Height (inches): 64 Interpreting Physician: Baird Lyons MD, ABSM Weight (lbs): 220 RPSGT: Zadie Rhine BMI: 38 MRN: [id number] Neck Size: 16.50  CLINICAL INFORMATION The patient is referred for a CPAP titration to treat sleep apnea.  Date of NPSG, Split Night or HST:    NPSG 01/21/2019- AHI 21.2/ hr, desaturation to 87%, body weight 215 lbs  SLEEP STUDY TECHNIQUE As per the AASM Manual for the Scoring of Sleep and Associated Events v2.3 (April 2016) with a hypopnea requiring 4% desaturations.  The channels recorded and monitored were frontal, central and occipital EEG, electrooculogram (EOG), submentalis EMG (chin), nasal and oral airflow, thoracic and abdominal wall motion, anterior tibialis EMG, snore microphone, electrocardiogram, and pulse oximetry. Continuous positive airway pressure (CPAP) was initiated at the beginning of the study and titrated to treat sleep-disordered breathing.  MEDICATIONS Medications self-administered by patient taken the night of the study : BUSPAR, LATUDA, MELATONIN, VIT-B-12 PO, WELCHOL, ZYRTEC  TECHNICIAN COMMENTS Comments added by technician: NO RESTROOM VISTED Comments added by scorer: N/A  RESPIRATORY PARAMETERS Optimal PAP Pressure (cm): 9 AHI at Optimal Pressure (/hr): 0.0 Overall Minimal O2 (%): 92.0 Supine % at Optimal Pressure (%): 64 Minimal O2 at Optimal Pressure (%): 92.0   SLEEP ARCHITECTURE The study was initiated at 9:42:56 PM and ended at 5:10:07 AM.  Sleep onset time was 8.0 minutes and the sleep efficiency was 95.3%%. The total sleep time was 426 minutes.  The patient spent 1.6%% of the night in stage N1 sleep, 47.9%% in stage N2 sleep, 26.2%% in stage N3 and 24.3% in REM.Stage REM latency was 116.5 minutes  Wake after sleep onset was 13.2. Alpha intrusion was absent. Supine sleep  was 71.48%.  CARDIAC DATA The 2 lead EKG demonstrated sinus rhythm. The mean heart rate was 79.3 beats per minute. Other EKG findings include: None.  LEG MOVEMENT DATA The total Periodic Limb Movements of Sleep (PLMS) were 0. The PLMS index was 0.0. A PLMS index of <15 is considered normal in adults.  IMPRESSIONS - The optimal PAP pressure was 9 cm of water. - Central sleep apnea was not noted during this titration (CAI = 0.0/h). - Significant oxygen desaturations were not observed during this titration (min O2 = 92.0%). - No snoring was audible during this study. - No cardiac abnormalities were observed during this study. - Clinically significant periodic limb movements were not noted during this study. Arousals associated with PLMs were rare.  DIAGNOSIS - Obstructive Sleep Apnea (327.23 [G47.33 ICD-10])  RECOMMENDATIONS - Trial of CPAP therapy on 9 cm H2O or autopap 5-15. - Patient used a Small size Resmed Full Face Mask AirFit F30i mask and heated humidification. - Be careful with alcohol, sedatives and other CNS depressants that may worsen sleep apnea and disrupt normal sleep architecture. - Sleep hygiene should be reviewed to assess factors that may improve sleep quality. - Weight management and regular exercise should be initiated or continued.  [Electronically signed] 02/26/2019 11:29 AM  Baird Lyons MD, ABSM Diplomate, American Board of Sleep Medicine   NPI: [id number]                         [place] [place] [facility]  ELECTRONICALLY SIGNED ON:  02/26/2019, 11:26 AM [place] PH: [phone]   FX: [phone] [place]  [place]

## 2019-02-28 ENCOUNTER — Other Ambulatory Visit: Payer: Self-pay | Admitting: Internal Medicine

## 2019-02-28 DIAGNOSIS — G4733 Obstructive sleep apnea (adult) (pediatric): Secondary | ICD-10-CM

## 2019-04-18 ENCOUNTER — Encounter: Payer: Self-pay | Admitting: Family Medicine

## 2019-04-18 ENCOUNTER — Ambulatory Visit: Payer: BLUE CROSS/BLUE SHIELD | Admitting: Family Medicine

## 2019-04-18 ENCOUNTER — Other Ambulatory Visit: Payer: Self-pay

## 2019-04-18 ENCOUNTER — Ambulatory Visit (INDEPENDENT_AMBULATORY_CARE_PROVIDER_SITE_OTHER): Payer: BLUE CROSS/BLUE SHIELD | Admitting: Family Medicine

## 2019-04-18 VITALS — BP 110/60 | HR 105 | Temp 98.2°F | Wt 223.4 lb

## 2019-04-18 DIAGNOSIS — G4733 Obstructive sleep apnea (adult) (pediatric): Secondary | ICD-10-CM | POA: Diagnosis not present

## 2019-04-18 DIAGNOSIS — E781 Pure hyperglyceridemia: Secondary | ICD-10-CM | POA: Diagnosis not present

## 2019-04-18 DIAGNOSIS — S4991XA Unspecified injury of right shoulder and upper arm, initial encounter: Secondary | ICD-10-CM | POA: Diagnosis not present

## 2019-04-18 DIAGNOSIS — M542 Cervicalgia: Secondary | ICD-10-CM | POA: Diagnosis not present

## 2019-04-18 MED ORDER — METHOCARBAMOL 500 MG PO TABS: 500.0000 mg | ORAL_TABLET | Freq: Three times a day (TID) | ORAL | 0 refills | Status: DC | PRN

## 2019-04-18 MED ORDER — DICLOFENAC SODIUM 75 MG PO TBEC: 75.0000 mg | DELAYED_RELEASE_TABLET | Freq: Two times a day (BID) | ORAL | 0 refills | Status: DC

## 2019-04-18 NOTE — Patient Instructions (Signed)
Try taking the diclofenac and stop naproxen.   You may also try Robaxin as needed for pain. This may be sedating.   Continue with the topical Tiger balm and use heat on your shoulder and neck instead of ice.   Let me know if you are not improving in the next week or two or if you are getting worse.   Bring in your labs or have them sent to me if you have blood work this week.
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
## 2019-04-20 ENCOUNTER — Other Ambulatory Visit: Payer: Self-pay

## 2019-04-20 ENCOUNTER — Emergency Department: Payer: BLUE CROSS/BLUE SHIELD

## 2019-04-20 ENCOUNTER — Emergency Department
Admission: EM | Admit: 2019-04-20 | Discharge: 2019-04-20 | Disposition: A | Discharge status: Home / Self Care | Payer: BLUE CROSS/BLUE SHIELD | Attending: Emergency Medicine | Admitting: Emergency Medicine

## 2019-04-20 ENCOUNTER — Encounter: Payer: Self-pay | Admitting: *Deleted

## 2019-04-20 DIAGNOSIS — J45909 Unspecified asthma, uncomplicated: Secondary | ICD-10-CM | POA: Insufficient documentation

## 2019-04-20 DIAGNOSIS — Z79899 Other long term (current) drug therapy: Secondary | ICD-10-CM | POA: Insufficient documentation

## 2019-04-20 DIAGNOSIS — S161XXA Strain of muscle, fascia and tendon at neck level, initial encounter: Secondary | ICD-10-CM | POA: Diagnosis not present

## 2019-04-20 DIAGNOSIS — S43401A Unspecified sprain of right shoulder joint, initial encounter: Secondary | ICD-10-CM | POA: Diagnosis not present

## 2019-04-20 DIAGNOSIS — Y9389 Activity, other specified: Secondary | ICD-10-CM | POA: Insufficient documentation

## 2019-04-20 DIAGNOSIS — S199XXA Unspecified injury of neck, initial encounter: Secondary | ICD-10-CM | POA: Diagnosis present

## 2019-04-20 DIAGNOSIS — Y999 Unspecified external cause status: Secondary | ICD-10-CM | POA: Diagnosis not present

## 2019-04-20 DIAGNOSIS — S46911A Strain of unspecified muscle, fascia and tendon at shoulder and upper arm level, right arm, initial encounter: Secondary | ICD-10-CM

## 2019-04-20 DIAGNOSIS — Y929 Unspecified place or not applicable: Secondary | ICD-10-CM | POA: Insufficient documentation

## 2019-04-20 MED ORDER — CYCLOBENZAPRINE HCL 10 MG PO TABS
10.0000 mg | ORAL_TABLET | Freq: Once | ORAL | Status: AC
  Administered 2019-04-20: 10 mg via ORAL
  Filled 2019-04-20: qty 1

## 2019-04-20 MED ORDER — TRAMADOL HCL 50 MG PO TABS
50.0000 mg | ORAL_TABLET | Freq: Once | ORAL | Status: AC
  Administered 2019-04-20: 50 mg via ORAL
  Filled 2019-04-20: qty 1

## 2019-04-20 MED ORDER — TRAMADOL HCL 50 MG PO TABS: 50.0000 mg | ORAL_TABLET | Freq: Four times a day (QID) | ORAL | 0 refills | Status: DC | PRN

## 2019-04-20 MED ORDER — CYCLOBENZAPRINE HCL 10 MG PO TABS: 10.0000 mg | ORAL_TABLET | Freq: Three times a day (TID) | ORAL | 0 refills | Status: DC | PRN
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
## 2019-04-28 ENCOUNTER — Encounter: Payer: Self-pay | Admitting: Family Medicine

## 2019-04-28 ENCOUNTER — Other Ambulatory Visit: Payer: Self-pay

## 2019-04-28 ENCOUNTER — Ambulatory Visit (INDEPENDENT_AMBULATORY_CARE_PROVIDER_SITE_OTHER): Payer: BLUE CROSS/BLUE SHIELD | Admitting: Family Medicine

## 2019-04-28 VITALS — BP 110/70 | HR 92 | Temp 98.7°F | Ht 64.0 in | Wt 224.4 lb

## 2019-04-28 DIAGNOSIS — G4733 Obstructive sleep apnea (adult) (pediatric): Secondary | ICD-10-CM | POA: Diagnosis not present

## 2019-04-28 DIAGNOSIS — E781 Pure hyperglyceridemia: Secondary | ICD-10-CM | POA: Diagnosis not present

## 2019-04-28 DIAGNOSIS — M542 Cervicalgia: Secondary | ICD-10-CM | POA: Diagnosis not present

## 2019-04-28 DIAGNOSIS — S4991XD Unspecified injury of right shoulder and upper arm, subsequent encounter: Secondary | ICD-10-CM

## 2019-04-28 MED ORDER — ICOSAPENT ETHYL 1 G PO CAPS: 2.0000 g | ORAL_CAPSULE | Freq: Two times a day (BID) | ORAL | 0 refills | Status: DC
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
## 2019-05-03 ENCOUNTER — Other Ambulatory Visit: Payer: Self-pay

## 2019-05-03 ENCOUNTER — Ambulatory Visit (INDEPENDENT_AMBULATORY_CARE_PROVIDER_SITE_OTHER): Payer: BLUE CROSS/BLUE SHIELD | Admitting: Family Medicine

## 2019-05-03 ENCOUNTER — Encounter: Payer: Self-pay | Admitting: Family Medicine

## 2019-05-03 DIAGNOSIS — M25511 Pain in right shoulder: Secondary | ICD-10-CM | POA: Diagnosis not present

## 2019-05-03 MED ORDER — MELOXICAM 15 MG PO TABS: 7.5000 mg | ORAL_TABLET | Freq: Every day | ORAL | 6 refills | Status: DC | PRN
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
## 2019-05-06 ENCOUNTER — Encounter: Payer: Self-pay | Admitting: Family Medicine

## 2019-05-06 DIAGNOSIS — M25511 Pain in right shoulder: Secondary | ICD-10-CM

## 2019-05-10 ENCOUNTER — Telehealth: Payer: Self-pay | Admitting: Family Medicine

## 2019-05-10 DIAGNOSIS — E781 Pure hyperglyceridemia: Secondary | ICD-10-CM

## 2019-05-10 NOTE — Telephone Encounter (Signed)
Pt called and is requesting a refill on her VASCEPA please send to the  [id number] [place], [place] - [address] pt can be reached at [phone]

## 2019-05-10 NOTE — Telephone Encounter (Signed)
Ok to refill 

## 2019-05-11 MED ORDER — ICOSAPENT ETHYL 1 G PO CAPS: 2.0000 g | ORAL_CAPSULE | Freq: Two times a day (BID) | ORAL | 2 refills | Status: DC

## 2019-05-11 NOTE — Telephone Encounter (Signed)
done

## 2019-05-19 ENCOUNTER — Other Ambulatory Visit: Payer: Self-pay | Admitting: Internal Medicine

## 2019-05-19 DIAGNOSIS — E781 Pure hyperglyceridemia: Secondary | ICD-10-CM

## 2019-05-19 MED ORDER — ICOSAPENT ETHYL 1 G PO CAPS: 2.0000 g | ORAL_CAPSULE | Freq: Two times a day (BID) | ORAL | 2 refills | Status: DC

## 2019-05-23 ENCOUNTER — Ambulatory Visit: Payer: BLUE CROSS/BLUE SHIELD | Admitting: Physical Therapy

## 2019-05-24 ENCOUNTER — Encounter: Payer: Self-pay | Admitting: Family Medicine

## 2019-06-01 ENCOUNTER — Ambulatory Visit
Admission: RE | Admit: 2019-06-01 | Discharge: 2019-06-01 | Disposition: A | Discharge status: Home / Self Care | Payer: BLUE CROSS/BLUE SHIELD | Source: Ambulatory Visit | Attending: Family Medicine | Admitting: Family Medicine

## 2019-06-01 ENCOUNTER — Other Ambulatory Visit: Payer: Self-pay

## 2019-06-01 ENCOUNTER — Other Ambulatory Visit: Payer: Self-pay | Admitting: Family Medicine

## 2019-06-01 DIAGNOSIS — M25511 Pain in right shoulder: Secondary | ICD-10-CM

## 2019-06-01 MED ORDER — IOPAMIDOL (ISOVUE-M 200) INJECTION 41%: 12.0000 mL | Freq: Once | INTRAMUSCULAR | Status: DC

## 2019-06-02 ENCOUNTER — Telehealth: Payer: Self-pay | Admitting: Family Medicine

## 2019-06-02 NOTE — Telephone Encounter (Signed)
Shoulder MRI looks good, no rotator cuff or labrum cartilage tear.

## 2019-06-08 ENCOUNTER — Ambulatory Visit: Payer: BLUE CROSS/BLUE SHIELD | Admitting: Physical Therapy

## 2019-06-16 ENCOUNTER — Encounter: Payer: Self-pay | Admitting: Rehabilitative and Restorative Service Providers"

## 2019-06-16 ENCOUNTER — Other Ambulatory Visit: Payer: Self-pay

## 2019-06-16 ENCOUNTER — Ambulatory Visit (INDEPENDENT_AMBULATORY_CARE_PROVIDER_SITE_OTHER): Payer: BLUE CROSS/BLUE SHIELD | Admitting: Rehabilitative and Restorative Service Providers"

## 2019-06-16 DIAGNOSIS — M6281 Muscle weakness (generalized): Secondary | ICD-10-CM

## 2019-06-16 DIAGNOSIS — M25511 Pain in right shoulder: Secondary | ICD-10-CM

## 2019-06-16 NOTE — Patient Instructions (Signed)
Access Code: [email or web address] URL: [email or web address] Date: 06/16/2019 Prepared by: Chyrel Masson  Exercises Shoulder Extension with Resistance - 10 reps - 3 sets - 2x daily - 7x weekly Standing Shoulder Row with Anchored Resistance - 10 reps - 3 sets - 2x daily - 7x weekly Supine Shoulder Flexion with Dowel - 10 reps - 3 sets - 2x daily - 7x weekly Supine Shoulder Flexion Extension Full Range AROM - 10 reps - 3 sets - 2x daily - 7x weekly
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
## 2019-06-22 ENCOUNTER — Other Ambulatory Visit: Payer: Self-pay

## 2019-06-22 ENCOUNTER — Encounter: Payer: Self-pay | Admitting: Physical Therapy

## 2019-06-22 ENCOUNTER — Ambulatory Visit (INDEPENDENT_AMBULATORY_CARE_PROVIDER_SITE_OTHER): Payer: BLUE CROSS/BLUE SHIELD | Admitting: Physical Therapy

## 2019-06-22 DIAGNOSIS — M6281 Muscle weakness (generalized): Secondary | ICD-10-CM

## 2019-06-22 DIAGNOSIS — M25511 Pain in right shoulder: Secondary | ICD-10-CM | POA: Diagnosis not present

## 2019-06-22 NOTE — Therapy (Signed)
[facility] Physical Therapy [address] [place], [place], [address] Phone: [phone]   Fax:  [phone]  Physical Therapy Treatment  Patient Details  Name: Linda Chambers MRN: [id number] Date of Birth: January 18, 1996 Referring Provider (PT): Dr. Prince Rome   Encounter Date: 06/22/2019  PT End of Session - 06/22/19 0818    Visit Number  2    Number of Visits  12    Date for PT Re-Evaluation  07/28/19    PT Start Time  0800    PT Stop Time  0845    PT Time Calculation (min)  45 min    Activity Tolerance  Patient tolerated treatment well    Behavior During Therapy  [facility] for tasks assessed/performed       Past Medical History:  Diagnosis Date  . Allergic rhinitis   . Anxiety   . Asthma    ?excercise only  . Bipolar 1 disorder (HCC)   . Hypercholesteremia   . OSA (obstructive sleep apnea) 02/02/2019  . Ovarian cyst     Past Surgical History:  Procedure Laterality Date  . ABDOMINAL SURGERY    . VAGINOPLASTY    . WISDOM TOOTH EXTRACTION Bilateral     There were no vitals filed for this visit.  Subjective Assessment - 06/22/19 0811    Subjective  Pt arriving to therapy reporting 4/10 pain. Pt reporting a little soreness due to starting working out again yesterday. Pt reported that she has been doing her HEP with no difficulties.    Limitations  Lifting;House hold activities    Diagnostic tests  MRI, xray negative    Patient Stated Goals  Reduce pain, return to workouts.    Currently in Pain?  Yes    Pain Score  4     Pain Location  Shoulder    Pain Orientation  Right    Pain Descriptors / Indicators  Sore    Pain Type  Acute pain    Pain Onset  More than a month ago    Pain Frequency  Intermittent                       [facility] Adult PT Treatment/Exercise - 06/22/19 0001      Exercises   Exercises  Shoulder      Shoulder Exercises: Standing   External Rotation  Both;15 reps    Theraband Level (Shoulder External Rotation)  Level 1  (Yellow)    Extension  AROM;Strengthening;Both;Theraband;Other (comment)   3 x 10   Theraband Level (Shoulder Extension)  Level 3 (Green)    Row  Strengthening;Both;Theraband   3x 10   Theraband Level (Shoulder Row)  Level 3 (Green)      Shoulder Exercises: ROM/Strengthening   UBE (Upper Arm Bike)  L1 2 minutes forward and back      Shoulder Exercises: Stretch   Cross Chest Stretch  2 reps;20 seconds    Wall Stretch - Flexion  5 reps;10 seconds    Other Shoulder Stretches  3 way door stretch x 2 reps each position holding 20 seconds      Modalities   Modalities  Moist Heat      Moist Heat Therapy   Number Minutes Moist Heat  5 Minutes    Moist Heat Location  Shoulder      Manual Therapy   Manual Therapy  Soft tissue mobilization    Manual therapy comments  8 minutes    Soft tissue mobilization  IASTM to R  posterior shoulder             PT Education - 06/22/19 0816    Education Details  Exercise techniques    Person(s) Educated  Patient    Methods  Explanation;Demonstration    Comprehension  Verbalized understanding;Returned demonstration          PT Long Term Goals - 06/22/19 0827      PT LONG TERM GOAL #1   Title  Patient will demonstrate/report pain at worst less than or equal to 2/10 to facilitate minimal limitation in daily activity secondary to pain symptoms.    Time  6    Period  Weeks    Status  On-going      PT LONG TERM GOAL #2   Title  Patient will demonstrate independent use of home exercise program to facilitate ability to maintain/progress functional gains from skilled physical therapy services.    Time  6    Period  Weeks    Status  On-going      PT LONG TERM GOAL #3   Title  Pt. will demonstrate Rt shoulder AROM WFL s symptoms to facilitate usual daily and recreational activity s restriction.    Time  6    Period  Weeks    Status  On-going      PT LONG TERM GOAL #4   Title  Patient will demonstrate Rt UE MMT 5/5 throughout to  facilitate usual lifting, carrying in functional activity to PLOF s limitation.    Status  On-going      PT LONG TERM GOAL #5   Title  Pt. will demonstrate/report patient specific functional scale rating average > or = 8/10.    Time  6    Period  Weeks    Status  On-going            Plan - 06/22/19 7564    Clinical Impression Statement  Pt arriving to therapy reporting 4/10 pain. Pt tolerating exercises well reporting mild soreness during Diagnols using theraband.Good response to IASTM to posterior shoulder, upper  and mid trap.  Pt reporting relief with wall slides using a ball with increased flexion stretch. Continue skilled PT and progress toward goals set.    Examination-Activity Limitations  Reach Overhead;Carry    Stability/Clinical Decision Making  Stable/Uncomplicated    Rehab Potential  Good    PT Frequency  2x / week    PT Duration  6 weeks    PT Treatment/Interventions  ADLs/Self Care Home Management;Electrical Stimulation;Functional mobility training;Therapeutic activities;Therapeutic exercise;Neuromuscular re-education;Manual techniques;Patient/family education    PT Next Visit Plan  Improve mobility/strength    PT Home Exercise Plan  [email or web address]       Patient will benefit from skilled therapeutic intervention in order to improve the following deficits and impairments:  Decreased range of motion, Impaired UE functional use, Decreased activity tolerance, Pain, Decreased mobility, Decreased strength  Visit Diagnosis: Acute pain of right shoulder  Muscle weakness (generalized)     Problem List Patient Active Problem List   Diagnosis Date Noted  . OSA (obstructive sleep apnea) 02/02/2019  . Hx of ovarian cyst 05/21/2018  . 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 Hypertriglyceridemia 05/19/2018  . Asthma   . Bipolar 1 disorder ([place])   . Hypercholesteremia   . Anxiety   . Allergic rhinitis     Oretha Caprice, PT 06/22/2019, 8:49 AM  [facility] Physical Therapy [address] [place], [place], [address] Phone: [phone]   Fax:  [phone]  Name: Keiandra Sullenger MRN: [id number] Date of Birth: November 01, 1995

## 2019-06-24 ENCOUNTER — Ambulatory Visit (INDEPENDENT_AMBULATORY_CARE_PROVIDER_SITE_OTHER): Payer: BLUE CROSS/BLUE SHIELD | Admitting: Physical Therapy

## 2019-06-24 ENCOUNTER — Other Ambulatory Visit: Payer: Self-pay

## 2019-06-24 ENCOUNTER — Encounter: Payer: Self-pay | Admitting: Physical Therapy

## 2019-06-24 DIAGNOSIS — M25511 Pain in right shoulder: Secondary | ICD-10-CM

## 2019-06-24 DIAGNOSIS — M6281 Muscle weakness (generalized): Secondary | ICD-10-CM | POA: Diagnosis not present

## 2019-06-24 NOTE — Therapy (Signed)
[place], [place], [address] Phone: [phone]   Fax:  [phone]  Physical Therapy Treatment  Patient Details  Name: Linda Chambers MRN: [id number] Date of Birth: 1995/07/01 Referring Provider (PT): Dr. Junius Roads   Encounter Date: 06/24/2019  PT End of Session - 06/24/19 1432    Visit Number  3    Number of Visits  12    Date for PT Re-Evaluation  07/28/19    PT Start Time  9983    PT Stop Time  1515    PT Time Calculation (min)  42 min    Activity Tolerance  Patient tolerated treatment well    Behavior During Therapy  [facility] for tasks assessed/performed       Past Medical History:  Diagnosis Date  . Allergic rhinitis   . Anxiety   . Asthma    ?excercise only  . Bipolar 1 disorder ([place])   . Hypercholesteremia   . OSA (obstructive sleep apnea) 02/02/2019  . Ovarian cyst     Past Surgical History:  Procedure Laterality Date  . ABDOMINAL SURGERY    . VAGINOPLASTY    . WISDOM TOOTH EXTRACTION Bilateral     There were no vitals filed for this visit.  Subjective Assessment - 06/24/19 1435    Subjective  Patient reports she is a little sore but she has been doing a barre class that has a lot of arm movements. The first few classes she felt great but then felt sore a lot. She felt really good following last visit but then soreness came back and has been sore all day today.    Patient Stated Goals  Reduce pain, return to workouts.    Currently in Pain?  Yes    Pain Score  5     Pain Location  Shoulder    Pain Orientation  Right    Pain Descriptors / Indicators  Sore    Pain Type  Acute pain    Pain Onset  More than a month ago    Pain Frequency  Intermittent         [facility] PT Assessment - 06/24/19 0001      AROM   Right Shoulder Flexion  150 Degrees   165 deg following manual therapy   Left Shoulder Flexion  175 Degrees                   [facility] Adult PT Treatment/Exercise - 06/24/19 0001      Exercises   Exercises  Shoulder      Shoulder Exercises: Standing   External Rotation  15 reps    Theraband Level (Shoulder External Rotation)  Level 2 (Red)    External Rotation Limitations  no money    Flexion  10 reps    Flexion Limitations  wall slide    Extension  15 reps    Theraband Level (Shoulder Extension)  Level 3 (Green)    Row  15 reps    Theraband Level (Shoulder Row)  Level 3 (Green)    Other Standing Exercises  Instructed in SMFR to posterior cuff using tennis ball against wall      Shoulder Exercises: Stretch   Cross Chest Stretch  3 reps;10 seconds    Wall Stretch - Flexion  5 reps   5 sec hold   Table Stretch - Flexion  5 reps   5 sec hold   Table Stretch -Flexion Limitations  using physioball  Manual Therapy   Manual Therapy  Joint mobilization;Myofascial release    Joint Mobilization  right GHJ inferior, posterior mobs    Myofascial Release  right posterior cuff             PT Education - 06/24/19 1432    Education Details  HEP    Person(s) Educated  Patient    Methods  Explanation;Demonstration;Verbal cues    Comprehension  Verbalized understanding;Returned demonstration;Verbal cues required;Need further instruction          PT Long Term Goals - 06/22/19 0827      PT LONG TERM GOAL #1   Title  Patient will demonstrate/report pain at worst less than or equal to 2/10 to facilitate minimal limitation in daily activity secondary to pain symptoms.    Time  6    Period  Weeks    Status  On-going      PT LONG TERM GOAL #2   Title  Patient will demonstrate independent use of home exercise program to facilitate ability to maintain/progress functional gains from skilled physical therapy services.    Time  6    Period  Weeks    Status  On-going      PT LONG TERM GOAL #3   Title  Pt. will demonstrate Rt shoulder AROM WFL s symptoms to facilitate usual daily and recreational activity s restriction.    Time  6    Period  Weeks    Status   On-going      PT LONG TERM GOAL #4   Title  Patient will demonstrate Rt UE MMT 5/5 throughout to facilitate usual lifting, carrying in functional activity to PLOF s limitation.    Status  On-going      PT LONG TERM GOAL #5   Title  Pt. will demonstrate/report patient specific functional scale rating average > or = 8/10.    Time  6    Period  Weeks    Status  On-going            Plan - 06/24/19 1433    Clinical Impression Statement  Patient reporting continued soreness of right shoulder. She reported improvement in sorenss and exhibited greater active rmotion following manual therapy this visit. She was provided with stretches and instructed on SMFR using tennis ball for right posterior cuff. Her rotator cuff strengthening was progressed. She still reports soreness and twinge of the posterior shoulder that seems to be muscular. She would benefit from continued skilled PT to progress motion and strength to return to PLOF.    PT Treatment/Interventions  ADLs/Self Care Home Management;Electrical Stimulation;Functional mobility training;Therapeutic activities;Therapeutic exercise;Neuromuscular re-education;Manual techniques;Patient/family education    PT Next Visit Plan  Improve mobility/strength    PT Home Exercise Plan  [email or web address]    Consulted and Agree with Plan of Care  Patient       Patient will benefit from skilled therapeutic intervention in order to improve the following deficits and impairments:  Decreased range of motion, Impaired UE functional use, Decreased activity tolerance, Pain, Decreased mobility, Decreased strength  Visit Diagnosis: Acute pain of right shoulder  Muscle weakness (generalized)     Problem List Patient Active Problem List   Diagnosis Date Noted  . OSA (obstructive sleep apnea) 02/02/2019  . Hx of ovarian cyst 05/21/2018  . 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 Hypertriglyceridemia 05/19/2018  . Asthma   . Bipolar 1 disorder (HCC)    . Hypercholesteremia   . Anxiety   . Allergic rhinitis  Rosana Hoes, PT, DPT, LAT, ATC 06/24/19  3:24 PM   [place] [facility] Physical Therapy [address] [place], [place], [address] Phone: [phone]   Fax:  [phone]  Name: Linda Chambers MRN: [id number] Date of Birth: November 11, 1995

## 2019-06-27 ENCOUNTER — Encounter: Payer: Self-pay | Admitting: Rehabilitative and Restorative Service Providers"

## 2019-06-27 ENCOUNTER — Ambulatory Visit (INDEPENDENT_AMBULATORY_CARE_PROVIDER_SITE_OTHER): Payer: BLUE CROSS/BLUE SHIELD | Admitting: Rehabilitative and Restorative Service Providers"

## 2019-06-27 ENCOUNTER — Other Ambulatory Visit: Payer: Self-pay

## 2019-06-27 DIAGNOSIS — M25511 Pain in right shoulder: Secondary | ICD-10-CM | POA: Diagnosis not present

## 2019-06-27 DIAGNOSIS — M6281 Muscle weakness (generalized): Secondary | ICD-10-CM

## 2019-06-27 NOTE — Therapy (Signed)
[place], [place], [address] Phone: [phone]   Fax:  [phone]  Physical Therapy Treatment  Patient Details  Name: Linda Chambers MRN: [id number] Date of Birth: Sep 30, 1995 Referring Provider (PT): Dr. Junius Roads   Encounter Date: 06/27/2019  PT End of Session - 06/27/19 1423    Visit Number  4    Number of Visits  12    Date for PT Re-Evaluation  07/28/19    PT Start Time  1400    PT Stop Time  1443    PT Time Calculation (min)  43 min    Activity Tolerance  Patient tolerated treatment well    Behavior During Therapy  [facility] for tasks assessed/performed       Past Medical History:  Diagnosis Date  . Allergic rhinitis   . Anxiety   . Asthma    ?excercise only  . Bipolar 1 disorder ([place])   . Hypercholesteremia   . OSA (obstructive sleep apnea) 02/02/2019  . Ovarian cyst     Past Surgical History:  Procedure Laterality Date  . ABDOMINAL SURGERY    . VAGINOPLASTY    . Linda TOOTH EXTRACTION Bilateral     There were no vitals filed for this visit.  Subjective Assessment - 06/27/19 1401    Subjective  Pt. indicated a lot of activity after last visit including a workout.  Pt. stated pain increased moderately for a few days.  Some catches at times noted.    Patient Stated Goals  Reduce pain, return to workouts.    Currently in Pain?  Yes    Pain Score  5     Pain Location  Shoulder    Pain Orientation  Right    Pain Descriptors / Indicators  Sore    Pain Type  Acute pain    Pain Onset  More than a month ago                       [facility] Adult PT Treatment/Exercise - 06/27/19 0001      Shoulder Exercises: Seated   Other Seated Exercises  seated Bilateral ER, scapular retraction 2 x 10      Shoulder Exercises: Standing   External Rotation  Strengthening;Both;20 reps    Theraband Level (Shoulder External Rotation)  Level 3 (Green)    Extension  20 reps    Theraband Level (Shoulder Extension)   Level 3 (Green)    Row  Both;20 reps    Theraband Level (Shoulder Row)  Level 3 (Green)      Shoulder Exercises: ROM/Strengthening   UBE (Upper Arm Bike)  L1 3.5 mins fwd/back each      Shoulder Exercises: Stretch   Cross Chest Stretch  3 reps   15 seconds     Modalities   Modalities  Electrical Stimulation      Electrical Stimulation   Electrical Stimulation Location  Rt shoulder    Electrical Stimulation Action  IFC 10 mins c exercises    Electrical Stimulation Parameters  to tolerance    Electrical Stimulation Goals  Pain      Manual Therapy   Myofascial Release  right posterior cuff       Trigger Point Dry Needling - 06/27/19 0001    Consent Given?  Yes    Education Handout Provided  Yes    Muscles Treated Upper Quadrant  Infraspinatus    Infraspinatus Response  Twitch response elicited  PT Education - 06/27/19 1413    Education Details  DN    Person(s) Educated  Patient    Methods  Explanation;Demonstration;Handout    Comprehension  Verbalized understanding;Returned demonstration          PT Long Term Goals - 06/22/19 0827      PT LONG TERM GOAL #1   Title  Patient will demonstrate/report pain at worst less than or equal to 2/10 to facilitate minimal limitation in daily activity secondary to pain symptoms.    Time  6    Period  Weeks    Status  On-going      PT LONG TERM GOAL #2   Title  Patient will demonstrate independent use of home exercise program to facilitate ability to maintain/progress functional gains from skilled physical therapy services.    Time  6    Period  Weeks    Status  On-going      PT LONG TERM GOAL #3   Title  Pt. will demonstrate Rt shoulder AROM WFL s symptoms to facilitate usual daily and recreational activity s restriction.    Time  6    Period  Weeks    Status  On-going      PT LONG TERM GOAL #4   Title  Patient will demonstrate Rt UE MMT 5/5 throughout to facilitate usual lifting, carrying in functional  activity to PLOF s limitation.    Status  On-going      PT LONG TERM GOAL #5   Title  Pt. will demonstrate/report patient specific functional scale rating average > or = 8/10.    Time  6    Period  Weeks    Status  On-going            Plan - 06/27/19 1419    Clinical Impression Statement  Concordant symptoms noted from Rt infraspinatus c reduced pressure/pain following manual intervention and DN.  Continued strengthening/coordination improvement indicated overall.    Examination-Activity Limitations  Reach Overhead;Carry    PT Treatment/Interventions  ADLs/Self Care Home Management;Electrical Stimulation;Functional mobility training;Therapeutic activities;Therapeutic exercise;Neuromuscular re-education;Manual techniques;Patient/family education    PT Next Visit Plan  Improve mobility/strength, DN prn    PT Home Exercise Plan  [id number]    Consulted and Agree with Plan of Care  Patient       Patient will benefit from skilled therapeutic intervention in order to improve the following deficits and impairments:  Decreased range of motion, Impaired UE functional use, Decreased activity tolerance, Pain, Decreased mobility, Decreased strength  Visit Diagnosis: Acute pain of right shoulder  Muscle weakness (generalized)     Problem List Patient Active Problem List   Diagnosis Date Noted  . OSA (obstructive sleep apnea) 02/02/2019  . Hx of ovarian cyst 05/21/2018  . 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 Hypertriglyceridemia 05/19/2018  . Asthma   . Bipolar 1 disorder (HCC)   . Hypercholesteremia   . Anxiety   . Allergic rhinitis    Chyrel Masson, PT, DPT, OCS, ATC 06/27/19  2:38 PM    [facility] Physical Therapy [address] [place], [place], [address] Phone: [phone]   Fax:  [phone]  Name: Linda Chambers MRN: [id number] Date of Birth: 1995-12-01

## 2019-06-29 ENCOUNTER — Encounter: Payer: Self-pay | Admitting: Rehabilitative and Restorative Service Providers"

## 2019-06-29 ENCOUNTER — Other Ambulatory Visit: Payer: Self-pay

## 2019-06-29 ENCOUNTER — Ambulatory Visit (INDEPENDENT_AMBULATORY_CARE_PROVIDER_SITE_OTHER): Payer: BLUE CROSS/BLUE SHIELD | Admitting: Rehabilitative and Restorative Service Providers"

## 2019-06-29 DIAGNOSIS — M25511 Pain in right shoulder: Secondary | ICD-10-CM

## 2019-06-29 DIAGNOSIS — M6281 Muscle weakness (generalized): Secondary | ICD-10-CM

## 2019-06-29 NOTE — Therapy (Signed)
[place], [place], [address] Phone: [phone]   Fax:  [phone]  Physical Therapy Treatment  Patient Details  Name: Linda Chambers MRN: [id number] Date of Birth: [birth year] Referring Provider (PT): Dr. Junius Roads   Encounter Date: 06/29/2019  PT End of Session - 06/29/19 1422    Visit Number  5    Number of Visits  12    Date for PT Re-Evaluation  07/28/19    PT Start Time  7619    PT Stop Time  1440    PT Time Calculation (min)  42 min    Activity Tolerance  Patient tolerated treatment well    Behavior During Therapy  [facility] for tasks assessed/performed       Past Medical History:  Diagnosis Date  . Allergic rhinitis   . Anxiety   . Asthma    ?excercise only  . Bipolar 1 disorder ([place])   . Hypercholesteremia   . OSA (obstructive sleep apnea) 02/02/2019  . Ovarian cyst     Past Surgical History:  Procedure Laterality Date  . ABDOMINAL SURGERY    . VAGINOPLASTY    . WISDOM TOOTH EXTRACTION Bilateral     There were no vitals filed for this visit.  Subjective Assessment - 06/29/19 1400    Subjective  Pt. stated feeling improvement in pain symptoms since last visit.  Reported soreness for a day or so but better movement this morning and not as tight.    Patient Stated Goals  Reduce pain, return to workouts.    Currently in Pain?  No/denies    Pain Score  0-No pain    Pain Location  Shoulder    Pain Orientation  Right    Pain Onset  More than a month ago                       [facility] Adult PT Treatment/Exercise - 06/29/19 0001      Shoulder Exercises: Seated   Other Seated Exercises  seated Bilateral ER, scapular retraction 2 x 10      Shoulder Exercises: Standing   External Rotation  Strengthening;Both;20 reps    Theraband Level (Shoulder External Rotation)  Level 4 (Blue)    Extension  20 reps    Theraband Level (Shoulder Extension)  Level 4 (Blue)    Row  Both;20 reps    Theraband Level  (Shoulder Row)  Level 4 (Blue)      Shoulder Exercises: ROM/Strengthening   UBE (Upper Arm Bike)  L1 3.5 mins fwd/back each      Shoulder Exercises: Stretch   Cross Chest Stretch  3 reps;30 seconds      Electrical Stimulation   Electrical Stimulation Location  Rt shoulder    Electrical Stimulation Action  IFC 10 mins c exercises    Electrical Stimulation Parameters  to tolerance    Electrical Stimulation Goals  Pain      Manual Therapy   Myofascial Release  right posterior cuff       Trigger Point Dry Needling - 06/29/19 0001    Consent Given?  Yes    Education Handout Provided  Previously provided    Muscles Treated Upper Quadrant  Infraspinatus    Dry Needling Comments  Rt infraspinatus    Infraspinatus Response  Twitch response elicited           PT Education - 06/29/19 1401    Education Details  Occasional intervention cues.  Person(s) Educated  Patient    Methods  Explanation;Demonstration;Verbal cues    Comprehension  Verbalized understanding;Returned demonstration          PT Long Term Goals - 06/22/19 0827      PT LONG TERM GOAL #1   Title  Patient will demonstrate/report pain at worst less than or equal to 2/10 to facilitate minimal limitation in daily activity secondary to pain symptoms.    Time  6    Period  Weeks    Status  On-going      PT LONG TERM GOAL #2   Title  Patient will demonstrate independent use of home exercise program to facilitate ability to maintain/progress functional gains from skilled physical therapy services.    Time  6    Period  Weeks    Status  On-going      PT LONG TERM GOAL #3   Title  Pt. will demonstrate Rt shoulder AROM WFL s symptoms to facilitate usual daily and recreational activity s restriction.    Time  6    Period  Weeks    Status  On-going      PT LONG TERM GOAL #4   Title  Patient will demonstrate Rt UE MMT 5/5 throughout to facilitate usual lifting, carrying in functional activity to PLOF s  limitation.    Status  On-going      PT LONG TERM GOAL #5   Title  Pt. will demonstrate/report patient specific functional scale rating average > or = 8/10.    Time  6    Period  Weeks    Status  On-going            Plan - 06/29/19 1415    Clinical Impression Statement  Pt. reported marked improvement in symptoms presentation following infraspinatus release techniques.  Pt. to benefit from skilled PT services continued to improve strength and control.    Examination-Activity Limitations  Reach Overhead;Carry    PT Treatment/Interventions  ADLs/Self Care Home Management;Electrical Stimulation;Functional mobility training;Therapeutic activities;Therapeutic exercise;Neuromuscular re-education;Manual techniques;Patient/family education    PT Next Visit Plan  DN prn, scapular control    PT Home Exercise Plan  [id number]    Consulted and Agree with Plan of Care  Patient       Patient will benefit from skilled therapeutic intervention in order to improve the following deficits and impairments:  Decreased range of motion, Impaired UE functional use, Decreased activity tolerance, Pain, Decreased mobility, Decreased strength  Visit Diagnosis: Acute pain of right shoulder  Muscle weakness (generalized)     Problem List Patient Active Problem List   Diagnosis Date Noted  . OSA (obstructive sleep apnea) 02/02/2019  . Hx of ovarian cyst 05/21/2018  . 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 Hypertriglyceridemia 05/19/2018  . Asthma   . Bipolar 1 disorder (HCC)   . Hypercholesteremia   . Anxiety   . Allergic rhinitis     Chyrel Masson, PT, DPT, OCS, ATC 06/29/19  2:39 PM    [facility] Physical Therapy [address] [place], [place], [address] Phone: [phone]   Fax:  [phone]  Name: Linda Chambers MRN: [id number] Date of Birth: 07/16/1995

## 2019-07-04 ENCOUNTER — Encounter: Payer: Self-pay | Admitting: Rehabilitative and Restorative Service Providers"

## 2019-07-04 ENCOUNTER — Other Ambulatory Visit: Payer: Self-pay

## 2019-07-04 ENCOUNTER — Ambulatory Visit (INDEPENDENT_AMBULATORY_CARE_PROVIDER_SITE_OTHER): Payer: BLUE CROSS/BLUE SHIELD | Admitting: Rehabilitative and Restorative Service Providers"

## 2019-07-04 DIAGNOSIS — M6281 Muscle weakness (generalized): Secondary | ICD-10-CM

## 2019-07-04 DIAGNOSIS — M25511 Pain in right shoulder: Secondary | ICD-10-CM | POA: Diagnosis not present

## 2019-07-04 NOTE — Therapy (Signed)
[place], [place], [address] Phone: [phone]   Fax:  [phone]  Physical Therapy Treatment/Progress Note  Patient Details  Name: Linda Chambers MRN: [id number] Date of Birth: [birth year] Referring Provider (PT): Dr. Junius Roads  Progress Note Reporting Period 06/16/2019 to 07/04/2019  See note below for Objective Data and Assessment of Progress/Goals.      Encounter Date: 07/04/2019  PT End of Session - 07/04/19 1035    Visit Number  6    Number of Visits  12    Date for PT Re-Evaluation  07/28/19    PT Start Time  4128    PT Stop Time  1056    PT Time Calculation (min)  42 min    Activity Tolerance  Patient tolerated treatment well    Behavior During Therapy  WFL for tasks assessed/performed       Past Medical History:  Diagnosis Date  . Allergic rhinitis   . Anxiety   . Asthma    ?excercise only  . Bipolar 1 disorder ([place])   . Hypercholesteremia   . OSA (obstructive sleep apnea) 02/02/2019  . Ovarian cyst     Past Surgical History:  Procedure Laterality Date  . ABDOMINAL SURGERY    . VAGINOPLASTY    . WISDOM TOOTH EXTRACTION Bilateral     There were no vitals filed for this visit.  Subjective Assessment - 07/04/19 1037    Subjective  Pt. stated no sharp pains, just more soreness overall.  Feeling stronger though.  Pt. indicated 4/10 soreness at most.  Pt. stated she was scrubbing floor and felt some increase in soreness.    Patient Stated Goals  Reduce pain, return to workouts.    Pain Onset  More than a month ago    Aggravating Factors   Patient specific functional scale in ().  reaching into cabinet (eval 7/10,  07/04/19 update: 9/10  ),  driving (eval 7/86,  07/04/19 update: 7/10  ),  usual exercise  (eval 4/10,  07/04/19 update: 7/10  )         [facility] PT Assessment - 07/04/19 0001      AROM   Right Shoulder Flexion  160 Degrees      PROM   Right Shoulder External Rotation  75 Degrees   in 90 deg  abd     Strength   Right Shoulder Flexion  4+/5   mild complaint   Right Shoulder ABduction  4+/5   mild complaints   Right Shoulder Internal Rotation  5/5    Right Shoulder External Rotation  5/5                   [facility] Adult PT Treatment/Exercise - 07/04/19 0001      Shoulder Exercises: Supine   Horizontal ABduction  Strengthening;Both;Other (comment)   3 x 10     Shoulder Exercises: Prone   Other Prone Exercises  prone y, t 3 x 10 each 1 lb       Shoulder Exercises: Standing   Other Standing Exercises  Rt UE ER c flexion punch 3 x 10 blue band    Other Standing Exercises  stnading ball circles 30 x 3 each way (clockwise, counterclockwise)      Shoulder Exercises: ROM/Strengthening   UBE (Upper Arm Bike)  L1.5 3 mins each way fwd/back      Manual Therapy   Soft tissue mobilization  actvie compression to Rt infraspinatus c movement  PT Education - 07/04/19 1021    Education Details  Cues for techniques on exercise at times.          PT Long Term Goals - 07/04/19 1033      PT LONG TERM GOAL #1   Title  Patient will demonstrate/report pain at worst less than or equal to 2/10 to facilitate minimal limitation in daily activity secondary to pain symptoms.    Time  6    Period  Weeks    Status  On-going    Target Date  07/28/19      PT LONG TERM GOAL #2   Title  Patient will demonstrate independent use of home exercise program to facilitate ability to maintain/progress functional gains from skilled physical therapy services.    Time  6    Period  Weeks    Status  Achieved      PT LONG TERM GOAL #3   Title  Pt. will demonstrate Rt shoulder AROM WFL s symptoms to facilitate usual daily and recreational activity s restriction.    Time  6    Period  Weeks    Status  Partially Met    Target Date  07/28/19      PT LONG TERM GOAL #4   Title  Patient will demonstrate Rt UE MMT 5/5 throughout to facilitate usual lifting, carrying in  functional activity to PLOF s limitation.    Status  Partially Met    Target Date  07/28/19      PT LONG TERM GOAL #5   Title  Pt. will demonstrate/report patient specific functional scale rating average > or = 8/10.    Baseline  07/04/19 update: avg 7.667    Time  6    Period  Weeks    Status  On-going    Target Date  07/28/19            Plan - 07/04/19 1034    Clinical Impression Statement  Pt. has attended skilled PT services for 2.5 weeks at this time.  Pt. has reported reduced severity of symptoms at this time.  Pt. has demonstrated improved AROM and improved strength measurements c reduced symptom severity but mild impairments still noted at this time.  Myofascial concordant symptom chief complaint at this time.    Examination-Activity Limitations  Reach Overhead;Carry    PT Treatment/Interventions  ADLs/Self Care Home Management;Electrical Stimulation;Functional mobility training;Therapeutic activities;Therapeutic exercise;Neuromuscular re-education;Manual techniques;Patient/family education    PT Next Visit Plan  DN prn, scapular control    PT Home Exercise Plan  [id number]    Consulted and Agree with Plan of Care  Patient       Patient will benefit from skilled therapeutic intervention in order to improve the following deficits and impairments:  Decreased range of motion, Impaired UE functional use, Decreased activity tolerance, Pain, Decreased mobility, Decreased strength  Visit Diagnosis: Acute pain of right shoulder  Muscle weakness (generalized)     Problem List Patient Active Problem List   Diagnosis Date Noted  . OSA (obstructive sleep apnea) 02/02/2019  . Hx of ovarian cyst 05/21/2018  . 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 Hypertriglyceridemia 05/19/2018  . Asthma   . Bipolar 1 disorder ([place])   . Hypercholesteremia   . Anxiety   . Allergic rhinitis     Scot Jun, PT, DPT, OCS, ATC 07/04/19  10:49 AM    [facility] Physical Therapy [address] [place], [place], [address] Phone: [phone]   Fax:  [phone]  Name: Linda Chambers MRN: [id number] Date of Birth: 12/23/1995

## 2019-07-06 ENCOUNTER — Other Ambulatory Visit: Payer: Self-pay

## 2019-07-06 ENCOUNTER — Encounter: Payer: Self-pay | Admitting: Rehabilitative and Restorative Service Providers"

## 2019-07-06 ENCOUNTER — Ambulatory Visit (INDEPENDENT_AMBULATORY_CARE_PROVIDER_SITE_OTHER): Payer: BLUE CROSS/BLUE SHIELD | Admitting: Rehabilitative and Restorative Service Providers"

## 2019-07-06 DIAGNOSIS — M6281 Muscle weakness (generalized): Secondary | ICD-10-CM | POA: Diagnosis not present

## 2019-07-06 DIAGNOSIS — M25511 Pain in right shoulder: Secondary | ICD-10-CM

## 2019-07-06 NOTE — Therapy (Signed)
[place], [place], [address] Phone: [phone]   Fax:  [phone]  Physical Therapy Treatment  Patient Details  Name: Linda Chambers MRN: [id number] Date of Birth: 1996-02-21 Referring Provider (PT): Dr. Junius Roads   Encounter Date: 07/06/2019  PT End of Session - 07/06/19 0922    Visit Number  7    Number of Visits  12    Date for PT Re-Evaluation  07/28/19    PT Start Time  0919    PT Stop Time  1008    PT Time Calculation (min)  49 min    Activity Tolerance  Patient tolerated treatment well    Behavior During Therapy  [facility] for tasks assessed/performed       Past Medical History:  Diagnosis Date  . Allergic rhinitis   . Anxiety   . Asthma    ?excercise only  . Bipolar 1 disorder ([place])   . Hypercholesteremia   . OSA (obstructive sleep apnea) 02/02/2019  . Ovarian cyst     Past Surgical History:  Procedure Laterality Date  . ABDOMINAL SURGERY    . VAGINOPLASTY    . WISDOM TOOTH EXTRACTION Bilateral     There were no vitals filed for this visit.  Subjective Assessment - 07/06/19 0921    Subjective  Pt. reported less pain complaints.  Feeling pretty good today.  Did some soreness and tired feeling from HEP overall but helped improved symptoms c tennis ball use for compession at home.    Patient Stated Goals  Reduce pain, return to workouts.    Currently in Pain?  No/denies    Pain Score  0-No pain    Pain Location  Shoulder    Pain Onset  More than a month ago                       [facility] Adult PT Treatment/Exercise - 07/06/19 0001      Shoulder Exercises: Supine   Horizontal ABduction  Strengthening;Both   3 x 10 blue band     Shoulder Exercises: Prone   Other Prone Exercises  prone y, t 3 x   1 lb    Other Prone Exercises  serratus press up hold 5 second x 10   posterior shoulder complaints noted     Shoulder Exercises: Sidelying   Other Sidelying Exercises  sidelying reactive ball  catches er Rt UE 2 lbs 30 sec x 3      Shoulder Exercises: Standing   Other Standing Exercises  Rt UE ER c flexion punch 3 x 10 blue band    Other Standing Exercises  standing abduction c anterior blue band 3 x 10      Shoulder Exercises: ROM/Strengthening   UBE (Upper Arm Bike)  L1.5 3.5 mins fwd/rev each      Electrical Stimulation   Electrical Stimulation Location  Rt shoulder    Electrical Stimulation Action  IFC 8 mins     Electrical Stimulation Parameters  to tolerance    Electrical Stimulation Goals  Pain      Manual Therapy   Soft tissue mobilization  active compression to Rt infraspinatus c movement             PT Education - 07/06/19 0921    Education Details  Intervention techinque cues.    Person(s) Educated  Patient    Methods  Explanation;Demonstration;Verbal cues    Comprehension  Verbalized understanding;Returned demonstration  PT Long Term Goals - 07/04/19 1033      PT LONG TERM GOAL #1   Title  Patient will demonstrate/report pain at worst less than or equal to 2/10 to facilitate minimal limitation in daily activity secondary to pain symptoms.    Time  6    Period  Weeks    Status  On-going    Target Date  07/28/19      PT LONG TERM GOAL #2   Title  Patient will demonstrate independent use of home exercise program to facilitate ability to maintain/progress functional gains from skilled physical therapy services.    Time  6    Period  Weeks    Status  Achieved      PT LONG TERM GOAL #3   Title  Pt. will demonstrate Rt shoulder AROM WFL s symptoms to facilitate usual daily and recreational activity s restriction.    Time  6    Period  Weeks    Status  Partially Met    Target Date  07/28/19      PT LONG TERM GOAL #4   Title  Patient will demonstrate Rt UE MMT 5/5 throughout to facilitate usual lifting, carrying in functional activity to PLOF s limitation.    Status  Partially Met    Target Date  07/28/19      PT LONG TERM GOAL #5    Title  Pt. will demonstrate/report patient specific functional scale rating average > or = 8/10.    Baseline  07/04/19 update: avg 7.667    Time  6    Period  Weeks    Status  On-going    Target Date  07/28/19            Plan - 07/06/19 0948    Clinical Impression Statement  Posterior shoulder complaints noted c WB serratus press ups in prone and on wall.  Concordant symptoms noted c TrP pressure. Posterior scapular control to be improved.    Examination-Activity Limitations  Reach Overhead;Carry    PT Treatment/Interventions  ADLs/Self Care Home Management;Electrical Stimulation;Functional mobility training;Therapeutic activities;Therapeutic exercise;Neuromuscular re-education;Manual techniques;Patient/family education    PT Next Visit Plan  Posterior scapular.gh joint control improvement.    PT Home Exercise Plan  [id number]    Consulted and Agree with Plan of Care  Patient       Patient will benefit from skilled therapeutic intervention in order to improve the following deficits and impairments:  Decreased range of motion, Impaired UE functional use, Decreased activity tolerance, Pain, Decreased mobility, Decreased strength  Visit Diagnosis: Acute pain of right shoulder  Muscle weakness (generalized)     Problem List Patient Active Problem List   Diagnosis Date Noted  . OSA (obstructive sleep apnea) 02/02/2019  . Hx of ovarian cyst 05/21/2018  . 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 Hypertriglyceridemia 05/19/2018  . Asthma   . Bipolar 1 disorder ([place])   . Hypercholesteremia   . Anxiety   . Allergic rhinitis     Scot Jun, PT, DPT, OCS, ATC 07/06/19  10:01 AM    [facility] Physical Therapy [address] [place], [place], [address] Phone: [phone]   Fax:  [phone]  Name: Linda Chambers MRN: [id number] Date of Birth: [birth year]

## 2019-07-18 ENCOUNTER — Ambulatory Visit (INDEPENDENT_AMBULATORY_CARE_PROVIDER_SITE_OTHER): Payer: BLUE CROSS/BLUE SHIELD | Admitting: Rehabilitative and Restorative Service Providers"

## 2019-07-18 ENCOUNTER — Other Ambulatory Visit: Payer: Self-pay

## 2019-07-18 DIAGNOSIS — M6281 Muscle weakness (generalized): Secondary | ICD-10-CM

## 2019-07-18 DIAGNOSIS — M25511 Pain in right shoulder: Secondary | ICD-10-CM

## 2019-07-18 NOTE — Therapy (Signed)
[place], [place], [address] Phone: [phone]   Fax:  [phone]  Physical Therapy Treatment  Patient Details  Name: Deneshia Zucker MRN: [id number] Date of Birth: 15-May-1995 Referring Provider (PT): Dr. Junius Roads   Encounter Date: 07/18/2019  PT End of Session - 07/18/19 1017    Visit Number  8    Number of Visits  12    Date for PT Re-Evaluation  07/28/19    PT Start Time  8527    PT Stop Time  1058    PT Time Calculation (min)  43 min    Activity Tolerance  Patient tolerated treatment well    Behavior During Therapy  [facility] for tasks assessed/performed       Past Medical History:  Diagnosis Date  . Allergic rhinitis   . Anxiety   . Asthma    ?excercise only  . Bipolar 1 disorder ([place])   . Hypercholesteremia   . OSA (obstructive sleep apnea) 02/02/2019  . Ovarian cyst     Past Surgical History:  Procedure Laterality Date  . ABDOMINAL SURGERY    . VAGINOPLASTY    . WISDOM TOOTH EXTRACTION Bilateral     There were no vitals filed for this visit.  Subjective Assessment - 07/18/19 1020    Subjective  Pt. indicated feeling fairly good until having spasms while sitting over the weekend.  Pt. stated symptoms returned to 6/10 or so at worst, real sore in area of posterior/lateral shoulder.  Pt. stated prior to spasms she was feeling 2-3/10 at worst.    Patient Stated Goals  Reduce pain, return to workouts.    Currently in Pain?  Yes    Pain Score  6     Pain Location  Shoulder    Pain Orientation  Right    Pain Descriptors / Indicators  Sore    Pain Type  Acute pain    Pain Onset  More than a month ago    Pain Frequency  Intermittent    Aggravating Factors   lifting overhead, reaching, carrying while traveling.    Pain Relieving Factors  Rest, treatment in clinic c dry needling.         [facility] PT Assessment - 07/18/19 0001      Observation/Other Assessments   Observations  Patient specific functional scale  in (). reaching into cabinet (eval 7/10, 07/04/19 update: 9/10 ), driving (eval 7/82, 08/05/51 update: 7/10 ), usual exercise (eval 4/10, 07/04/19 update: 7/10 )                   [facility] Adult PT Treatment/Exercise - 07/18/19 0001      Shoulder Exercises: Seated   Other Seated Exercises  seated Bilateral ER, scapular retraction 2 x 10    Other Seated Exercises  seated Rt cross arm stretch 15 sec x 5      Shoulder Exercises: Standing   Other Standing Exercises  Rt UE ER in neutral at side 3 x 15 blue band    Other Standing Exercises  90/90 kettle bell trunk rotations 10 lbs x 20      Shoulder Exercises: ROM/Strengthening   UBE (Upper Arm Bike)  Lvl 2 3 mins fwd/rev each      Electrical Stimulation   Electrical Stimulation Location  Rt shoulder    Electrical Stimulation Action  IFC 10 mins c exercise    Electrical Stimulation Parameters  to tolerance c stretching in HEP    Electrical Stimulation  Goals  Pain      Manual Therapy   Manual Therapy  Joint mobilization;Myofascial release    Soft tissue mobilization  active compression to Rt infraspinatus c movement       Trigger Point Dry Needling - 07/18/19 0001    Consent Given?  Yes    Education Handout Provided  Previously provided    Muscles Treated Upper Quadrant  Infraspinatus   Rt   Infraspinatus Response  Twitch response elicited           PT Education - 07/18/19 1059    Education Details  Cues at times.    Person(s) Educated  Patient    Methods  Explanation;Verbal cues    Comprehension  Verbalized understanding;Returned demonstration          PT Long Term Goals - 07/04/19 1033      PT LONG TERM GOAL #1   Title  Patient will demonstrate/report pain at worst less than or equal to 2/10 to facilitate minimal limitation in daily activity secondary to pain symptoms.    Time  6    Period  Weeks    Status  On-going    Target Date  07/28/19      PT LONG TERM GOAL #2   Title  Patient will demonstrate  independent use of home exercise program to facilitate ability to maintain/progress functional gains from skilled physical therapy services.    Time  6    Period  Weeks    Status  Achieved      PT LONG TERM GOAL #3   Title  Pt. will demonstrate Rt shoulder AROM WFL s symptoms to facilitate usual daily and recreational activity s restriction.    Time  6    Period  Weeks    Status  Partially Met    Target Date  07/28/19      PT LONG TERM GOAL #4   Title  Patient will demonstrate Rt UE MMT 5/5 throughout to facilitate usual lifting, carrying in functional activity to PLOF s limitation.    Status  Partially Met    Target Date  07/28/19      PT LONG TERM GOAL #5   Title  Pt. will demonstrate/report patient specific functional scale rating average > or = 8/10.    Baseline  07/04/19 update: avg 7.667    Time  6    Period  Weeks    Status  On-going    Target Date  07/28/19            Plan - 07/18/19 1059    Clinical Impression Statement  Return of symptoms moderately c increased use in overhead reaching and lifting with travel since last visit.  Successfully reduced in treatment in clinic.  Overall stabilization for overhead activity indicated at this time to improve control in activity.    Examination-Activity Limitations  Reach Overhead;Carry    PT Treatment/Interventions  ADLs/Self Care Home Management;Electrical Stimulation;Functional mobility training;Therapeutic activities;Therapeutic exercise;Neuromuscular re-education;Manual techniques;Patient/family education    PT Next Visit Plan  Progress note next visit.    PT Home Exercise Plan  [id number]    Consulted and Agree with Plan of Care  Patient       Patient will benefit from skilled therapeutic intervention in order to improve the following deficits and impairments:  Decreased range of motion, Impaired UE functional use, Decreased activity tolerance, Pain, Decreased mobility, Decreased strength  Visit Diagnosis: Acute pain  of right shoulder  Muscle weakness (generalized)  Problem List Patient Active Problem List   Diagnosis Date Noted  . OSA (obstructive sleep apnea) 02/02/2019  . Hx of ovarian cyst 05/21/2018  . 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 Hypertriglyceridemia 05/19/2018  . Asthma   . Bipolar 1 disorder ([place])   . Hypercholesteremia   . Anxiety   . Allergic rhinitis     Scot Jun, PT, DPT, OCS, ATC 07/18/19  11:01 AM    [facility] Physical Therapy [address] [place], [place], [address] Phone: [phone]   Fax:  [phone]  Name: Verner Kopischke MRN: [id number] Date of Birth: 12-02-1995

## 2019-07-20 ENCOUNTER — Encounter: Payer: BLUE CROSS/BLUE SHIELD | Admitting: Rehabilitative and Restorative Service Providers"

## 2019-07-22 ENCOUNTER — Ambulatory Visit (INDEPENDENT_AMBULATORY_CARE_PROVIDER_SITE_OTHER): Payer: BLUE CROSS/BLUE SHIELD | Admitting: Rehabilitative and Restorative Service Providers"

## 2019-07-22 ENCOUNTER — Other Ambulatory Visit: Payer: Self-pay

## 2019-07-22 ENCOUNTER — Encounter: Payer: Self-pay | Admitting: Rehabilitative and Restorative Service Providers"

## 2019-07-22 DIAGNOSIS — M6281 Muscle weakness (generalized): Secondary | ICD-10-CM | POA: Diagnosis not present

## 2019-07-22 DIAGNOSIS — M25511 Pain in right shoulder: Secondary | ICD-10-CM

## 2019-07-22 NOTE — Therapy (Signed)
[place], [place], [address] Phone: [phone]   Fax:  [phone]  Physical Therapy Treatment/Progress Note  Patient Details  Name: Linda Chambers MRN: [id number] Date of Birth: 26-Dec-1995 Referring Provider (PT): Dr. Junius Roads  Progress Note Reporting Period 07/04/2019 (last progress note) to 07/22/2019  See note below for Objective Data and Assessment of Progress/Goals.      Encounter Date: 07/22/2019  PT End of Session - 07/22/19 0857    Visit Number  9    Number of Visits  12    Date for PT Re-Evaluation  08/19/19    PT Start Time  0845    PT Stop Time  0930    PT Time Calculation (min)  45 min    Activity Tolerance  Patient tolerated treatment well    Behavior During Therapy  [facility] for tasks assessed/performed       Past Medical History:  Diagnosis Date  . Allergic rhinitis   . Anxiety   . Asthma    ?excercise only  . Bipolar 1 disorder ([place])   . Hypercholesteremia   . OSA (obstructive sleep apnea) 02/02/2019  . Ovarian cyst     Past Surgical History:  Procedure Laterality Date  . ABDOMINAL SURGERY    . VAGINOPLASTY    . WISDOM TOOTH EXTRACTION Bilateral     There were no vitals filed for this visit.  Subjective Assessment - 07/22/19 0851    Subjective  Pt. indicated pain improved in movements since last visit.  Pain at worst 4/10.  Very mild 1/10 at rest.    Patient Stated Goals  Reduce pain, return to workouts.    Currently in Pain?  Yes    Pain Score  1     Pain Location  Shoulder    Pain Orientation  Right    Pain Descriptors / Indicators  Sore    Pain Type  Acute pain    Pain Onset  More than a month ago    Aggravating Factors   Patient specific functional scale: reaching into cabinet 07/22/2019 update: 8/10 (07/04/19 update: 9/10 ), driving 11/13/4233 update: 7/10 (07/04/19 update: 7/10 ), usual exercise 07/22/2019 update: 7/10 ( 07/04/19 update: 7/10 )         [facility] PT Assessment - 07/22/19 0001       AROM   AROM Assessment Site  Lumbar    Right Shoulder Flexion  170 Degrees    Right Shoulder ABduction  160 Degrees    Right Shoulder Internal Rotation  70 Degrees    Right Shoulder External Rotation  90 Degrees      PROM   Right Shoulder Internal Rotation  80 Degrees      Strength   Right Shoulder Flexion  5/5    Right Shoulder ABduction  4+/5    Right Shoulder Internal Rotation  5/5    Right Shoulder External Rotation  5/5                   [facility] Adult PT Treatment/Exercise - 07/22/19 0001      Shoulder Exercises: Prone   Other Prone Exercises  prone y, t 3 x   1 lb      Shoulder Exercises: Sidelying   Other Sidelying Exercises  sidelying sleeper stretch 30 sec x 3    Other Sidelying Exercises  sidelying reactive plyo ball catch er 30 sec x 4      Shoulder Exercises: Standing   Other Standing Exercises  standing protraction c er hold flexion foam roll up on wall 2 x 10    Other Standing Exercises  blue band er, flexion punch and trunk rotation towards resistance 2 x 10      Manual Therapy   Soft tissue mobilization  active compression to Rt infraspinatus c movement, Rt upper trap    Myofascial Release  right posterior cuff                  PT Long Term Goals - 07/22/19 7829      PT LONG TERM GOAL #1   Title  Patient will demonstrate/report pain at worst less than or equal to 2/10 to facilitate minimal limitation in daily activity secondary to pain symptoms.    Time  4    Period  Weeks    Status  On-going    Target Date  08/19/19      PT LONG TERM GOAL #2   Title  Patient will demonstrate independent use of home exercise program to facilitate ability to maintain/progress functional gains from skilled physical therapy services.    Period  Weeks    Status  Achieved      PT LONG TERM GOAL #3   Title  Pt. will demonstrate Rt shoulder AROM WFL s symptoms to facilitate usual daily and recreational activity s restriction.    Baseline  Mild IR  mobility deficits still present (updated 07/22/2019)    Time  4    Period  Weeks    Status  Partially Met    Target Date  08/19/19      PT LONG TERM GOAL #4   Title  Patient will demonstrate Rt UE MMT 5/5 throughout to facilitate usual lifting, carrying in functional activity to PLOF s limitation.    Baseline  Abduction at 4+/5. updated 07/22/2019    Time  4    Status  Partially Met    Target Date  08/19/19      PT LONG TERM GOAL #5   Title  Pt. will demonstrate/report patient specific functional scale rating average > or = 8/10.    Baseline  --    Time  4    Period  Weeks    Status  On-going    Target Date  08/19/19            Plan - 07/22/19 0919    Clinical Impression Statement  Pt. has attended 9 visits during course of treatment.  See objective data for updated information.  Pt. has demonstrated improvements in strength and mobility as compared to evaluation, c reduced concordant symptoms overall.  Presentation still shows posterior capsule, infraspinatus/supraspinatus myofascial limitations c concordant pain which can affect symptoms in daily life such as lifting, reaching.  Pt. has demonstrated progress towards goals but does continue to present c symptoms that may benefit from continued skilled PT services.    Examination-Activity Limitations  Reach Overhead;Carry    PT Frequency  1x / week    PT Duration  4 weeks    PT Treatment/Interventions  ADLs/Self Care Home Management;Electrical Stimulation;Functional mobility training;Therapeutic activities;Therapeutic exercise;Neuromuscular re-education;Manual techniques;Patient/family education    PT Next Visit Plan  Continue to improve scapular control, posterior myofascial mobility.    PT Home Exercise Plan  [id number]    Consulted and Agree with Plan of Care  Patient       Patient will benefit from skilled therapeutic intervention in order to improve the following deficits and impairments:  Decreased range of  motion, Impaired UE  functional use, Decreased activity tolerance, Pain, Decreased mobility, Decreased strength  Visit Diagnosis: Acute pain of right shoulder  Muscle weakness (generalized)     Problem List Patient Active Problem List   Diagnosis Date Noted  . OSA (obstructive sleep apnea) 02/02/2019  . Hx of ovarian cyst 05/21/2018  . 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 Hypertriglyceridemia 05/19/2018  . Asthma   . Bipolar 1 disorder ([place])   . Hypercholesteremia   . Anxiety   . Allergic rhinitis     Scot Jun, PT, DPT, OCS, ATC 07/22/19  9:29 AM    [facility] Physical Therapy [address] [place], [place], [address] Phone: [phone]   Fax:  [phone]  Name: Nafeesah Lapaglia MRN: [id number] Date of Birth: [birth year]

## 2019-08-02 ENCOUNTER — Other Ambulatory Visit: Payer: Self-pay

## 2019-08-02 ENCOUNTER — Ambulatory Visit (INDEPENDENT_AMBULATORY_CARE_PROVIDER_SITE_OTHER): Payer: BLUE CROSS/BLUE SHIELD | Admitting: Rehabilitative and Restorative Service Providers"

## 2019-08-02 DIAGNOSIS — M6281 Muscle weakness (generalized): Secondary | ICD-10-CM | POA: Diagnosis not present

## 2019-08-02 DIAGNOSIS — M25511 Pain in right shoulder: Secondary | ICD-10-CM

## 2019-08-02 NOTE — Therapy (Signed)
[facility] Physical Therapy [address] , , [address] Phone: [phone]   Fax:  [phone]  Physical Therapy Treatment  Patient Details  Name: Linda Chambers MRN: [id number] Date of Birth: 11/01/1995 Referring Provider (PT): Dr. Hilts   Encounter Date: 08/02/2019  PT End of Session - 08/02/19 1011    Visit Number  10    Number of Visits  12    Date for PT Re-Evaluation  08/19/19    PT Start Time  1011    PT Stop Time  1050    PT Time Calculation (min)  39 min    Activity Tolerance  Patient tolerated treatment well    Behavior During Therapy  WFL for tasks assessed/performed       Past Medical History:  Diagnosis Date  . Allergic rhinitis   . Anxiety   . Asthma    ?excercise only  . Bipolar 1 disorder ([facility])   . Hypercholesteremia   . OSA (obstructive sleep apnea) 02/02/2019  . Ovarian cyst     Past Surgical History:  Procedure Laterality Date  . ABDOMINAL SURGERY    . VAGINOPLASTY    . WISDOM TOOTH EXTRACTION Bilateral     There were no vitals filed for this visit.  Subjective Assessment - 08/02/19 1023    Subjective  Pt. reported feeling stronger and not having complaints from HEP overall.  Pt. stated still having complaints of posterior Rt shoulder pain moderate at times c sleeping, driving and sometimes after repetitive reaching/lifting.    Patient Stated Goals  Reduce pain, return to workouts.    Currently in Pain?  Yes    Pain Score  1     Pain Location  Shoulder    Pain Orientation  Right    Pain Descriptors / Indicators  Sore    Pain Type  Acute pain    Pain Onset  More than a month ago    Aggravating Factors   sleeping on Rt shoulder, driving c Lt turns, repetitive reaching                       [facility] Adult PT Treatment/Exercise - 08/02/19 0001      Shoulder Exercises: Supine   Other Supine Exercises  blue band d2 extension 3 x 10      Shoulder Exercises: Prone   Other Prone Exercises  prone y to  t to y 3 x 10 1 lb      Shoulder Exercises: Sidelying   Other Sidelying Exercises  sidelying sleeper stretch 30 sec x 3    Other Sidelying Exercises  sidelying eccentric er 3 x 10 3 lbs      Shoulder Exercises: Standing   Other Standing Exercises  standing abduction c anterior green band resistance 0-90 deg 3 x 10    Other Standing Exercises  standing SA press c lateral band walk 3 points x 15 bilateral      Manual Therapy   Soft tissue mobilization  active compression to Rt infraspinatus c movement, Rt upper trap    Myofascial Release  right posterior cuff                  PT Long Term Goals - 07/22/19 0923      PT LONG TERM GOAL #1   Title  Patient will demonstrate/report pain at worst less than or equal to 2/10 to facilitate minimal limitation in daily activity secondary to pain symptoms.    Time    4    Period  Weeks    Status  On-going    Target Date  08/19/19      PT LONG TERM GOAL #2   Title  Patient will demonstrate independent use of home exercise program to facilitate ability to maintain/progress functional gains from skilled physical therapy services.    Period  Weeks    Status  Achieved      PT LONG TERM GOAL #3   Title  Pt. will demonstrate Rt shoulder AROM WFL s symptoms to facilitate usual daily and recreational activity s restriction.    Baseline  Mild IR mobility deficits still present (updated 07/22/2019)    Time  4    Period  Weeks    Status  Partially Met    Target Date  08/19/19      PT LONG TERM GOAL #4   Title  Patient will demonstrate Rt UE MMT 5/5 throughout to facilitate usual lifting, carrying in functional activity to PLOF s limitation.    Baseline  Abduction at 4+/5. updated 07/22/2019    Time  4    Status  Partially Met    Target Date  08/19/19      PT LONG TERM GOAL #5   Title  Pt. will demonstrate/report patient specific functional scale rating average > or = 8/10.    Baseline  --    Time  4    Period  Weeks    Status  On-going     Target Date  08/19/19            Plan - 08/02/19 1025    Clinical Impression Statement  Reduced TrP and taut bands noted in infraspinatus, teres major but still some tenderness in lateral aspects.  Posterior shoulder complaints still evident c indications of secondary impingment movement pattern symptoms.    Examination-Activity Limitations  Reach Overhead;Carry    PT Frequency  1x / week    PT Duration  4 weeks    PT Treatment/Interventions  ADLs/Self Care Home Management;Electrical Stimulation;Functional mobility training;Therapeutic activities;Therapeutic exercise;Neuromuscular re-education;Manual techniques;Patient/family education    PT Next Visit Plan  Continue to improve scapular control, posterior myofascial mobility.    PT Home Exercise Plan  [id number]    Consulted and Agree with Plan of Care  Patient       Patient will benefit from skilled therapeutic intervention in order to improve the following deficits and impairments:  Decreased range of motion, Impaired UE functional use, Decreased activity tolerance, Pain, Decreased mobility, Decreased strength  Visit Diagnosis: Acute pain of right shoulder  Muscle weakness (generalized)     Problem List Patient Active Problem List   Diagnosis Date Noted  . OSA (obstructive sleep apnea) 02/02/2019  . Hx of ovarian cyst 05/21/2018  . 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 Hypertriglyceridemia 05/19/2018  . Asthma   . Bipolar 1 disorder ([place])   . Hypercholesteremia   . Anxiety   . Allergic rhinitis    Scot Jun, PT, DPT, OCS, ATC 08/02/19  10:49 AM    [facility] Physical Therapy [address] [place], [place], [address] Phone: [phone]   Fax:  [phone]  Name: Linda Chambers MRN: [id number] Date of Birth: 09-30-1995

## 2019-08-06 ENCOUNTER — Encounter: Payer: Self-pay | Admitting: Family Medicine

## 2019-08-09 ENCOUNTER — Encounter: Payer: Self-pay | Admitting: Physical Therapy

## 2019-08-09 ENCOUNTER — Ambulatory Visit (INDEPENDENT_AMBULATORY_CARE_PROVIDER_SITE_OTHER): Payer: BLUE CROSS/BLUE SHIELD | Admitting: Physical Therapy

## 2019-08-09 ENCOUNTER — Other Ambulatory Visit: Payer: Self-pay

## 2019-08-09 DIAGNOSIS — M6281 Muscle weakness (generalized): Secondary | ICD-10-CM

## 2019-08-09 DIAGNOSIS — M25511 Pain in right shoulder: Secondary | ICD-10-CM | POA: Diagnosis not present

## 2019-08-09 NOTE — Therapy (Signed)
[place], [place], [address] Phone: [phone]   Fax:  [phone]  Physical Therapy Treatment  Patient Details  Name: Linda Chambers MRN: [id number] Date of Birth: November 23, 1995 Referring Provider (PT): Dr. Junius Roads   Encounter Date: 08/09/2019  PT End of Session - 08/09/19 1301    Visit Number  11    Number of Visits  12    Date for PT Re-Evaluation  08/19/19    PT Start Time  0930    PT Stop Time  1015    PT Time Calculation (min)  45 min    Activity Tolerance  Patient tolerated treatment well    Behavior During Therapy  [facility] for tasks assessed/performed       Past Medical History:  Diagnosis Date  . Allergic rhinitis   . Anxiety   . Asthma    ?excercise only  . Bipolar 1 disorder ([place])   . Hypercholesteremia   . OSA (obstructive sleep apnea) 02/02/2019  . Ovarian cyst     Past Surgical History:  Procedure Laterality Date  . ABDOMINAL SURGERY    . VAGINOPLASTY    . WISDOM TOOTH EXTRACTION Bilateral     There were no vitals filed for this visit.  Subjective Assessment - 08/09/19 1245    Subjective  Pt arriving reporting increased pain today and reported that she feels that she over did it at her last session.    Limitations  Lifting;House hold activities    Diagnostic tests  MRI, xray negative    Patient Stated Goals  Reduce pain, return to workouts.    Currently in Pain?  Yes    Pain Score  4     Pain Location  Shoulder    Pain Orientation  Right    Pain Descriptors / Indicators  Sore    Pain Type  Acute pain    Pain Onset  More than a month ago    Pain Frequency  Intermittent                       [facility] Adult PT Treatment/Exercise - 08/09/19 0001      Shoulder Exercises: Prone   Other Prone Exercises  lying on ball: "w's, Y's and T's 2x10 each    Other Prone Exercises  quadraped, cat camel x 10 reps      Shoulder Exercises: Sidelying   Other Sidelying Exercises  sidelying sleeper  stretch 30 sec x 3    Other Sidelying Exercises  sidelying eccentric ER, x 10 reps      Shoulder Exercises: Standing   Row  Strengthening;Both;20 reps;Theraband    Theraband Level (Shoulder Row)  --   pink cords   Diagonals  Strengthening;Right;10 reps;Theraband    Theraband Level (Shoulder Diagonals)  Level 3 (Green)    Other Standing Exercises  wall push up    Other Standing Exercises  "money" ER green banc x 20 reps      Manual Therapy   Soft tissue mobilization  STM to R infraspinatus with overpressure with IR in sidelying position    Myofascial Release  R posterir capsule                  PT Long Term Goals - 08/09/19 1310      PT LONG TERM GOAL #1   Title  Patient will demonstrate/report pain at worst less than or equal to 2/10 to facilitate minimal limitation in daily activity secondary to  pain symptoms.    Time  4    Period  Weeks    Status  On-going      PT LONG TERM GOAL #2   Title  Patient will demonstrate independent use of home exercise program to facilitate ability to maintain/progress functional gains from skilled physical therapy services.    Period  Weeks    Status  Achieved      PT LONG TERM GOAL #3   Title  Pt. will demonstrate Rt shoulder AROM WFL s symptoms to facilitate usual daily and recreational activity s restriction.    Baseline  Mild IR mobility deficits still present (updated 07/22/2019)    Time  4    Period  Weeks    Status  Partially Met      PT LONG TERM GOAL #4   Title  Patient will demonstrate Rt UE MMT 5/5 throughout to facilitate usual lifting, carrying in functional activity to PLOF s limitation.    Baseline  Abduction at 4+/5. updated 07/22/2019    Time  4    Period  Weeks    Status  Partially Met      PT LONG TERM GOAL #5   Title  Pt. will demonstrate/report patient specific functional scale rating average > or = 8/10.    Baseline  07/04/19 update: avg 7.667    Time  4    Period  Weeks    Status  On-going             Plan - 08/09/19 1303    Clinical Impression Statement  Pt presenting today with increased shoulder pain and feels it was from over doing it at her last visit. Pt was instructed to concentrate on stretching and home and decresaed her resistance bands. Pt presenting with pain with Empty Can test and IR and pin point tenderness to pt's infraspinatus. Pt with good substained isometric contractions.  Continue to access pt's movements at next visit.    Examination-Activity Limitations  Reach Overhead;Carry    Stability/Clinical Decision Making  Stable/Uncomplicated    Rehab Potential  Good    PT Frequency  1x / week    PT Duration  4 weeks    PT Treatment/Interventions  ADLs/Self Care Home Management;Electrical Stimulation;Functional mobility training;Therapeutic activities;Therapeutic exercise;Neuromuscular re-education;Manual techniques;Patient/family education    PT Next Visit Plan  Continue to improve scapular control, posterior myofascial mobility.    PT Home Exercise Plan  [email or web address]    Consulted and Agree with Plan of Care  Patient       Patient will benefit from skilled therapeutic intervention in order to improve the following deficits and impairments:  Decreased range of motion, Impaired UE functional use, Decreased activity tolerance, Pain, Decreased mobility, Decreased strength  Visit Diagnosis: Acute pain of right shoulder  Muscle weakness (generalized)     Problem List Patient Active Problem List   Diagnosis Date Noted  . OSA (obstructive sleep apnea) 02/02/2019  . Hx of ovarian cyst 05/21/2018  . 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 Hypertriglyceridemia 05/19/2018  . Asthma   . Bipolar 1 disorder ([place])   . Hypercholesteremia   . Anxiety   . Allergic rhinitis     Linda Chambers, MPT 08/09/2019, 1:11 PM  [facility] Physical Therapy [address] [place], [place], [address] Phone: [phone]   Fax:   [phone]  Name: Linda Chambers MRN: [id number] Date of Birth: 03/20/1996

## 2019-08-16 ENCOUNTER — Encounter: Payer: Self-pay | Admitting: Rehabilitative and Restorative Service Providers"

## 2019-08-16 ENCOUNTER — Ambulatory Visit (INDEPENDENT_AMBULATORY_CARE_PROVIDER_SITE_OTHER): Payer: BLUE CROSS/BLUE SHIELD | Admitting: Rehabilitative and Restorative Service Providers"

## 2019-08-16 ENCOUNTER — Other Ambulatory Visit: Payer: Self-pay

## 2019-08-16 DIAGNOSIS — M6281 Muscle weakness (generalized): Secondary | ICD-10-CM | POA: Diagnosis not present

## 2019-08-16 DIAGNOSIS — M25511 Pain in right shoulder: Secondary | ICD-10-CM | POA: Diagnosis not present

## 2019-08-16 NOTE — Therapy (Signed)
[place], [place], [address] Phone: [phone]   Fax:  [phone]  Physical Therapy Treatment/Progress Note/Recert  Patient Details  Name: Linda Chambers MRN: [id number] Date of Birth: 1996/03/23 Referring Provider (PT): Dr. Junius Roads  Progress Note Reporting Period 07/22/2019 to 08/16/2019  See note below for Objective Data and Assessment of Progress/Goals.      Encounter Date: 08/16/2019  PT End of Session - 08/16/19 0849    Visit Number  12    Number of Visits  18    Date for PT Re-Evaluation  09/13/19    PT Start Time  0845    PT Stop Time  0925    PT Time Calculation (min)  40 min    Activity Tolerance  Patient tolerated treatment well    Behavior During Therapy  [facility] for tasks assessed/performed       Past Medical History:  Diagnosis Date  . Allergic rhinitis   . Anxiety   . Asthma    ?excercise only  . Bipolar 1 disorder ([place])   . Hypercholesteremia   . OSA (obstructive sleep apnea) 02/02/2019  . Ovarian cyst     Past Surgical History:  Procedure Laterality Date  . ABDOMINAL SURGERY    . VAGINOPLASTY    . WISDOM TOOTH EXTRACTION Bilateral     There were no vitals filed for this visit.  Subjective Assessment - 08/16/19 0846    Subjective  Pt. indicated hurting Lt 5th digit at PIP joint c swelling and light bruise.  Pt. indicated it occurred a few days ago.    Pt. indicated arm feels some better than last week.  Pt. stated driving chief complaint since last visit.  Pt. stated ROM stays better.    Limitations  Lifting;House hold activities    Diagnostic tests  MRI, xray negative    Patient Stated Goals  Reduce pain, return to workouts.    Currently in Pain?  Yes    Pain Score  5     Pain Location  Shoulder    Pain Orientation  Right    Pain Descriptors / Indicators  Dull;Sore    Pain Onset  More than a month ago    Pain Frequency  Intermittent    Aggravating Factors   driving         [facility] PT  Assessment - 08/16/19 0001      Assessment   Medical Diagnosis  Rt shoulder pain     Referring Provider (PT)  Dr. Junius Roads    Onset Date/Surgical Date  04/02/19    Hand Dominance  Right      Observation/Other Assessments   Observations  Patient specific functional scale in (). reaching into cabinet (7/10 ), driving (3/72 ), usual exercise (7/10 )      AROM   Overall AROM Comments  Flexion, abduction, ER mobility WFL at this time unremarkable.  IR in 90 deg abduction produced posterior shoulder pain c movement to 80 deg    Right Shoulder Internal Rotation  80 Degrees   measured in 90 deg abd     PROM   Right Shoulder Internal Rotation  85 Degrees      Strength   Right Shoulder Flexion  5/5    Right Shoulder ABduction  4+/5   concordant pain   Right Shoulder Internal Rotation  5/5    Right Shoulder External Rotation  4/5   5/5 in neutral, 4/5 in 90 deg abd     Special  Tests   Other special tests  (-) Rt lag sign for weakness, mild pain noted.  (-) Apprehension testing Rt, (-) Empty can for weakness, mild pain produced      Drop Arm test   Findings  Negative    Side  Right      Painful Arc of Motion   Findings  Positive    Side  Right                   [facility] Adult PT Treatment/Exercise - 08/16/19 0001      Manual Therapy   Manual therapy comments  R/S holds er/ir in neutral, 45 deg abd/  100 deg flexion r/s holds    Soft tissue mobilization  STM to R infraspinatus, active compression c ER movement.  MET for IR mobility                  PT Long Term Goals - 08/16/19 0909      PT LONG TERM GOAL #1   Title  Patient will demonstrate/report pain at worst less than or equal to 2/10 to facilitate minimal limitation in daily activity secondary to pain symptoms.    Time  4    Period  Weeks    Status  On-going    Target Date  09/13/19      PT LONG TERM GOAL #2   Title  Patient will demonstrate independent use of home exercise program to facilitate  ability to maintain/progress functional gains from skilled physical therapy services.    Period  Weeks    Status  Achieved      PT LONG TERM GOAL #3   Title  Pt. will demonstrate Rt shoulder AROM WFL s symptoms to facilitate usual daily and recreational activity s restriction.    Baseline  IR end range pain noted updated 08/16/2019    Time  4    Period  Weeks    Status  On-going    Target Date  09/13/19      PT LONG TERM GOAL #4   Title  Patient will demonstrate Rt UE MMT 5/5 throughout to facilitate usual lifting, carrying in functional activity to PLOF s limitation.    Time  4    Period  Weeks    Status  On-going    Target Date  09/13/19      PT LONG TERM GOAL #5   Title  Pt. will demonstrate/report patient specific functional scale rating average > or = 8/10.    Time  4    Period  Weeks    Status  On-going    Target Date  09/13/19            Plan - 08/16/19 0911    Clinical Impression Statement  Pt. has attended 12 visits during course of treatment cycle at this time.  Pt. has reported reduced initial pain symptoms of sharp c movement but currently reports dull ache mild to moderate intermittent during day.  See objective data for updated information.  Pt. has demonstrated overall improvement in mobility/strength at this time but does continue to demonstrate deficits that impair daily activity from PLOF.  Current presentation centered around posterior Rt shoulder pain associated c external rotator muscle activation as well as cross arm/IR mobility with pain noted at rest at times (including night).  Differential screening for rotator cuff integrity produces some concordant pain at times but overall muscle force production rated good at this time.  Pt. does present  c scapular control deficits c strength deficits in middle/lower trap that may impact Rt GH joint control/mobility.  Recommend continued treatment for 4 weeks and then reassess for possible MD follow up.     Examination-Activity Limitations  Reach Overhead;Carry    Stability/Clinical Decision Making  Stable/Uncomplicated    Rehab Potential  Good    PT Frequency  --   1-2   PT Duration  4 weeks    PT Treatment/Interventions  ADLs/Self Care Home Management;Electrical Stimulation;Functional mobility training;Therapeutic activities;Therapeutic exercise;Neuromuscular re-education;Manual techniques;Patient/family education    PT Next Visit Plan  Scapular control, posterior myofascial muscle mobility    PT Home Exercise Plan  [email or web address]    Consulted and Agree with Plan of Care  Patient       Patient will benefit from skilled therapeutic intervention in order to improve the following deficits and impairments:  Decreased range of motion, Impaired UE functional use, Decreased activity tolerance, Pain, Decreased mobility, Decreased strength  Visit Diagnosis: Acute pain of right shoulder - Plan: PT plan of care cert/re-cert  Muscle weakness (generalized) - Plan: PT plan of care cert/re-cert     Problem List Patient Active Problem List   Diagnosis Date Noted  . OSA (obstructive sleep apnea) 02/02/2019  . Hx of ovarian cyst 05/21/2018  . 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 Hypertriglyceridemia 05/19/2018  . Asthma   . Bipolar 1 disorder ([place])   . Hypercholesteremia   . Anxiety   . Allergic rhinitis    Scot Jun, PT, DPT, OCS, ATC 08/16/19  9:28 AM    [facility] Physical Therapy [address] [place], [place], [address] Phone: [phone]   Fax:  [phone]  Name: Lekeya Rollings MRN: [id number] Date of Birth: 19-Mar-1996

## 2019-08-16 NOTE — Patient Instructions (Signed)
Access Code: [email or web address] URL: [email or web address] Date: 08/16/2019 Prepared by: Chyrel Masson  Exercises Shoulder Extension with Resistance - 1 x daily - 7 x weekly - 10 reps - 3 sets Standing Shoulder Row with Anchored Resistance - 1 x daily - 7 x weekly - 10 reps - 3 sets Sleeper Stretch - 2-3 x daily - 7 x weekly - 1 sets - 5 reps - 30 hold Supine Shoulder Horizontal Abduction with Resistance - 1 x daily - 7 x weekly - 10 reps - 3 sets Supine PNF D2 Flexion with Resistance - 1 x daily - 7 x weekly - 3 sets - 10 reps Prone Single Arm Shoulder Y - 1 x daily - 7 x weekly - 3 sets - 15 reps Prone Shoulder Horizontal Abduction - 1 x daily - 7 x weekly - 3 sets - 15 reps Quadruped Scapular Protraction and Retraction - 1 x daily - 7 x weekly - 1 sets - 10 reps - 5 hold

## 2019-08-23 ENCOUNTER — Other Ambulatory Visit: Payer: Self-pay

## 2019-08-23 ENCOUNTER — Ambulatory Visit (INDEPENDENT_AMBULATORY_CARE_PROVIDER_SITE_OTHER): Payer: BLUE CROSS/BLUE SHIELD | Admitting: Rehabilitative and Restorative Service Providers"

## 2019-08-23 DIAGNOSIS — M6281 Muscle weakness (generalized): Secondary | ICD-10-CM | POA: Diagnosis not present

## 2019-08-23 DIAGNOSIS — M25511 Pain in right shoulder: Secondary | ICD-10-CM

## 2019-08-23 NOTE — Therapy (Signed)
[facility] Physical Therapy [address] [place], [place], [address] Phone: [phone]   Fax:  [phone]  Physical Therapy Treatment  Patient Details  Name: Linda Chambers MRN: [id number] Date of Birth: 06-17-1995 Referring Provider (PT): Dr. Prince Rome   Encounter Date: 08/23/2019  PT End of Session - 08/23/19 0846    Visit Number  13    Number of Visits  18    Date for PT Re-Evaluation  09/13/19    PT Start Time  0845    PT Stop Time  0935    PT Time Calculation (min)  50 min    Activity Tolerance  Patient tolerated treatment well    Behavior During Therapy  [facility] for tasks assessed/performed       Past Medical History:  Diagnosis Date  . Allergic rhinitis   . Anxiety   . Asthma    ?excercise only  . Bipolar 1 disorder ([facility])   . Hypercholesteremia   . OSA (obstructive sleep apnea) 02/02/2019  . Ovarian cyst     Past Surgical History:  Procedure Laterality Date  . ABDOMINAL SURGERY    . VAGINOPLASTY    . WISDOM TOOTH EXTRACTION Bilateral     There were no vitals filed for this visit.  Subjective Assessment - 08/23/19 0848    Subjective  Pt. stated not as much pain but some soreness because of moving over the weekend.  Pt. stated pain noted after moving (rated at 8/10).  Took pain medicine for first time in a while.  Pt. indicated resting that night and felt fine the next morrning.    Limitations  Lifting;House hold activities    Diagnostic tests  MRI, xray negative    Patient Stated Goals  Reduce pain, return to workouts.    Currently in Pain?  Yes    Pain Score  5     Pain Location  Shoulder    Pain Orientation  Right    Pain Descriptors / Indicators  Dull;Sore    Pain Type  Acute pain    Pain Onset  More than a month ago    Pain Frequency  Intermittent    Aggravating Factors   driving, moving things    Pain Relieving Factors  rest, medicine                       [facility] Adult PT Treatment/Exercise - 08/23/19 0001       Neuro Re-ed    Neuro Re-ed Details   R/S holds er/ir in neutral, 45 deg abd, 60 deg/  100 deg flexion r/s holds      Shoulder Exercises: Supine   Other Supine Exercises  green horizontal abd, diagonals2 x 10     Other Supine Exercises  supine SA press up 5 lbs 5 second hold x 15      Shoulder Exercises: Standing   Other Standing Exercises  green band ER in 20 deg abd 3 x 10 c fatigue hold on last rep      Shoulder Exercises: ROM/Strengthening   Other ROM/Strengthening Exercises  qruped y, t 20 x each bilateral      Electrical Stimulation   Electrical Stimulation Location  Rt shoulder    Electrical Stimulation Action  IFC    Electrical Stimulation Parameters  to tolerance    Electrical Stimulation Goals  Pain                  PT Long Term Goals - 08/16/19  0909      PT LONG TERM GOAL #1   Title  Patient will demonstrate/report pain at worst less than or equal to 2/10 to facilitate minimal limitation in daily activity secondary to pain symptoms.    Time  4    Period  Weeks    Status  On-going    Target Date  09/13/19      PT LONG TERM GOAL #2   Title  Patient will demonstrate independent use of home exercise program to facilitate ability to maintain/progress functional gains from skilled physical therapy services.    Period  Weeks    Status  Achieved      PT LONG TERM GOAL #3   Title  Pt. will demonstrate Rt shoulder AROM WFL s symptoms to facilitate usual daily and recreational activity s restriction.    Baseline  IR end range pain noted updated 08/16/2019    Time  4    Period  Weeks    Status  On-going    Target Date  09/13/19      PT LONG TERM GOAL #4   Title  Patient will demonstrate Rt UE MMT 5/5 throughout to facilitate usual lifting, carrying in functional activity to PLOF s limitation.    Time  4    Period  Weeks    Status  On-going    Target Date  09/13/19      PT LONG TERM GOAL #5   Title  Pt. will demonstrate/report patient specific functional scale  rating average > or = 8/10.    Time  4    Period  Weeks    Status  On-going    Target Date  09/13/19            Plan - 08/23/19 0910    Clinical Impression Statement  Aside from moving activity, Pt. reported indications of mild improvement in presentation since last visit.  Observed today: mild complaints in 90 deg abd, ER 70 deg ER activation in stabilization, fair to good recruitment in static hold.  Plan to incorporate continued periscapular strengthening/control and WB stability through Rt UE to improve function.    Examination-Activity Limitations  Reach Overhead;Carry    Stability/Clinical Decision Making  Stable/Uncomplicated    Rehab Potential  Good    PT Frequency  --   1-2   PT Duration  4 weeks    PT Treatment/Interventions  ADLs/Self Care Home Management;Electrical Stimulation;Functional mobility training;Therapeutic activities;Therapeutic exercise;Neuromuscular re-education;Manual techniques;Patient/family education    PT Next Visit Plan  Posterior muscle strengthening of Rt GH jt, scapula, WB improvement in Rt UE    PT Home Exercise Plan  [email or web address]    Consulted and Agree with Plan of Care  Patient       Patient will benefit from skilled therapeutic intervention in order to improve the following deficits and impairments:  Decreased range of motion, Impaired UE functional use, Decreased activity tolerance, Pain, Decreased mobility, Decreased strength  Visit Diagnosis: Acute pain of right shoulder  Muscle weakness (generalized)     Problem List Patient Active Problem List   Diagnosis Date Noted  . OSA (obstructive sleep apnea) 02/02/2019  . Hx of ovarian cyst 05/21/2018  . 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 Hypertriglyceridemia 05/19/2018  . Asthma   . Bipolar 1 disorder ([place])   . Hypercholesteremia   . Anxiety   . Allergic rhinitis    Scot Jun, PT, DPT, OCS, ATC 08/23/19  9:17 AM    [facility] Physical  Therapy [address] [place], [place], [address] Phone: [phone]   Fax:  [phone]  Name: Linda Chambers MRN: [id number] Date of Birth: 04-08-1996

## 2019-08-31 ENCOUNTER — Encounter: Payer: BLUE CROSS/BLUE SHIELD | Admitting: Rehabilitative and Restorative Service Providers"

## 2019-09-05 ENCOUNTER — Ambulatory Visit (INDEPENDENT_AMBULATORY_CARE_PROVIDER_SITE_OTHER): Payer: BLUE CROSS/BLUE SHIELD | Admitting: Rehabilitative and Restorative Service Providers"

## 2019-09-05 ENCOUNTER — Encounter: Payer: Self-pay | Admitting: Rehabilitative and Restorative Service Providers"

## 2019-09-05 ENCOUNTER — Other Ambulatory Visit: Payer: Self-pay

## 2019-09-05 DIAGNOSIS — M6281 Muscle weakness (generalized): Secondary | ICD-10-CM

## 2019-09-05 DIAGNOSIS — M25511 Pain in right shoulder: Secondary | ICD-10-CM

## 2019-09-05 NOTE — Therapy (Addendum)
[place], [place], [address] Phone: [phone]   Fax:  [phone]  Physical Therapy Treatment/Discharge  Patient Details  Name: Linda Chambers MRN: [id number] Date of Birth: May 11, 1995 Referring Provider (PT): Dr. Junius Roads   Encounter Date: 09/05/2019  PT End of Session - 09/05/19 1100    Visit Number  14    Number of Visits  18    Date for PT Re-Evaluation  09/13/19    PT Start Time  1100    PT Stop Time  1139    PT Time Calculation (min)  39 min    Activity Tolerance  Patient tolerated treatment well    Behavior During Therapy  [facility] for tasks assessed/performed       Past Medical History:  Diagnosis Date  . Allergic rhinitis   . Anxiety   . Asthma    ?excercise only  . Bipolar 1 disorder ([place])   . Hypercholesteremia   . OSA (obstructive sleep apnea) 02/02/2019  . Ovarian cyst     Past Surgical History:  Procedure Laterality Date  . ABDOMINAL SURGERY    . VAGINOPLASTY    . WISDOM TOOTH EXTRACTION Bilateral     There were no vitals filed for this visit.  Subjective Assessment - 09/05/19 1101    Subjective  Pt. indicated overall feeling some improvement in last few weeks.  Pt. indicated pain increase from moving that time subsided and she says reaching up is getting better.   Driving is mild trouble c cross over movement.    Limitations  Lifting;House hold activities    Diagnostic tests  MRI, xray negative    Patient Stated Goals  Reduce pain, return to workouts.    Currently in Pain?  No/denies    Pain Score  0-No pain    Pain Location  Shoulder    Pain Orientation  Right    Pain Onset  More than a month ago    Pain Frequency  Occasional    Aggravating Factors   cross arm movement                        [facility] Adult PT Treatment/Exercise - 09/05/19 0001      Neuro Re-ed    Neuro Re-ed Details   R/S holds er/ir in neutral, 45 deg abd, 60 deg/  100 deg flexion r/s holds      Shoulder  Exercises: Supine   Other Supine Exercises  green horizontal abd, diagonals2 x 10     Other Supine Exercises  supine SA press up 5 lbs 5 second hold x 15      Shoulder Exercises: Prone   Other Prone Exercises  qruped y, t 3 x 10 bilateral each      Shoulder Exercises: Sidelying   Other Sidelying Exercises  sidelying er eccentric 3 x 10 3 lbs      Shoulder Exercises: Standing   Other Standing Exercises  "money" ER green banc x 30 reps      Shoulder Exercises: ROM/Strengthening   UBE (Upper Arm Bike)  lvl 2.5 3 mins fwd/rev each                  PT Long Term Goals - 08/16/19 1115      PT LONG TERM GOAL #1   Title  Patient will demonstrate/report pain at worst less than or equal to 2/10 to facilitate minimal limitation in daily activity secondary to pain symptoms.  Time  4    Period  Weeks    Status  On-going    Target Date  09/13/19      PT LONG TERM GOAL #2   Title  Patient will demonstrate independent use of home exercise program to facilitate ability to maintain/progress functional gains from skilled physical therapy services.    Period  Weeks    Status  Achieved      PT LONG TERM GOAL #3   Title  Pt. will demonstrate Rt shoulder AROM WFL s symptoms to facilitate usual daily and recreational activity s restriction.    Baseline  IR end range pain noted updated 08/16/2019    Time  4    Period  Weeks    Status  On-going    Target Date  09/13/19      PT LONG TERM GOAL #4   Title  Patient will demonstrate Rt UE MMT 5/5 throughout to facilitate usual lifting, carrying in functional activity to PLOF s limitation.    Time  4    Period  Weeks    Status  On-going    Target Date  09/13/19      PT LONG TERM GOAL #5   Title  Pt. will demonstrate/report patient specific functional scale rating average > or = 8/10.    Time  4    Period  Weeks    Status  On-going    Target Date  09/13/19            Plan - 09/05/19 1135    Clinical Impression Statement   Positive improvement in ability to perform control in activity at this time.  Making progress in symptoms overall as well.   Recommended one more visit c assessment for HEP continuance based off symptoms.    Examination-Activity Limitations  Reach Overhead;Carry    Stability/Clinical Decision Making  Stable/Uncomplicated    Rehab Potential  Good    PT Frequency  --   1-2   PT Duration  4 weeks    PT Treatment/Interventions  ADLs/Self Care Home Management;Electrical Stimulation;Functional mobility training;Therapeutic activities;Therapeutic exercise;Neuromuscular re-education;Manual techniques;Patient/family education    PT Next Visit Plan  Continued HEP use and plan to transition after next visit if symptoms continue to reduce.    PT Home Exercise Plan  [id number]    Consulted and Agree with Plan of Care  Patient       Patient will benefit from skilled therapeutic intervention in order to improve the following deficits and impairments:  Decreased range of motion, Impaired UE functional use, Decreased activity tolerance, Pain, Decreased mobility, Decreased strength  Visit Diagnosis: Acute pain of right shoulder  Muscle weakness (generalized)     Problem List Patient Active Problem List   Diagnosis Date Noted  . OSA (obstructive sleep apnea) 02/02/2019  . Hx of ovarian cyst 05/21/2018  . 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 Hypertriglyceridemia 05/19/2018  . Asthma   . Bipolar 1 disorder ([place])   . Hypercholesteremia   . Anxiety   . Allergic rhinitis    Scot Jun, PT, DPT, OCS, ATC 09/05/19  11:39 AM   PHYSICAL THERAPY DISCHARGE SUMMARY  Visits from Start of Care: 14  Current functional level related to goals / functional outcomes: See note   Remaining deficits: See note   Education / Equipment: HEP Plan: Patient agrees to discharge.  Patient goals were partially met. Patient is being discharged due to being pleased with the current functional  level.  ?????  Scot Jun, PT, DPT, OCS, ATC 11/15/19  3:51 PM    [facility] Physical Therapy [address] [place], [place], [address] Phone: [phone]   Fax:  [phone]  Name: Linda Chambers MRN: [id number] Date of Birth: 11-17-1995

## 2019-09-13 ENCOUNTER — Encounter: Payer: BLUE CROSS/BLUE SHIELD | Admitting: Rehabilitative and Restorative Service Providers"

## 2019-09-13 ENCOUNTER — Telehealth: Payer: Self-pay | Admitting: Rehabilitative and Restorative Service Providers"

## 2019-09-13 NOTE — Telephone Encounter (Signed)
Called Pt. And spoke with her about no show for appointment.  She indicated she didn't receive any reminders about the appointment this time.  Pt. Indicated confidence in current presentation and will not schedule additional visits at this time pending symptoms in future.    Return prn.  Chyrel Masson, PT, DPT, OCS, ATC 09/13/19  11:22 AM

## 2019-09-19 ENCOUNTER — Other Ambulatory Visit: Payer: Self-pay | Admitting: Family Medicine

## 2019-09-19 DIAGNOSIS — E781 Pure hyperglyceridemia: Secondary | ICD-10-CM

## 2019-12-30 ENCOUNTER — Inpatient Hospital Stay (HOSPITAL_COMMUNITY)
Admission: AD | Admit: 2019-12-30 | Discharge: 2019-12-30 | Disposition: A | Discharge status: Home / Self Care | Payer: BLUE CROSS/BLUE SHIELD | Attending: Obstetrics & Gynecology | Admitting: Obstetrics & Gynecology

## 2019-12-30 ENCOUNTER — Inpatient Hospital Stay (HOSPITAL_COMMUNITY): Payer: BLUE CROSS/BLUE SHIELD

## 2019-12-30 ENCOUNTER — Other Ambulatory Visit: Payer: Self-pay

## 2019-12-30 DIAGNOSIS — F419 Anxiety disorder, unspecified: Secondary | ICD-10-CM | POA: Diagnosis not present

## 2019-12-30 DIAGNOSIS — G4733 Obstructive sleep apnea (adult) (pediatric): Secondary | ICD-10-CM | POA: Diagnosis not present

## 2019-12-30 DIAGNOSIS — R1012 Left upper quadrant pain: Secondary | ICD-10-CM

## 2019-12-30 DIAGNOSIS — Q510 Agenesis and aplasia of uterus: Secondary | ICD-10-CM | POA: Insufficient documentation

## 2019-12-30 DIAGNOSIS — E7800 Pure hypercholesterolemia, unspecified: Secondary | ICD-10-CM | POA: Insufficient documentation

## 2019-12-30 DIAGNOSIS — F319 Bipolar disorder, unspecified: Secondary | ICD-10-CM | POA: Diagnosis not present

## 2019-12-30 DIAGNOSIS — R1032 Left lower quadrant pain: Secondary | ICD-10-CM

## 2019-12-30 DIAGNOSIS — Z79899 Other long term (current) drug therapy: Secondary | ICD-10-CM | POA: Insufficient documentation

## 2019-12-30 LAB — CBC
HCT: 41.7 % (ref 36.0–46.0)
Hemoglobin: 13.5 g/dL (ref 12.0–15.0)
MCH: 25.1 pg — ABNORMAL LOW (ref 26.0–34.0)
MCHC: 32.4 g/dL (ref 30.0–36.0)
MCV: 77.7 fL — ABNORMAL LOW (ref 80.0–100.0)
Platelets: 464 10*3/uL — ABNORMAL HIGH (ref 150–400)
RBC: 5.37 MIL/uL — ABNORMAL HIGH (ref 3.87–5.11)
RDW: 14.8 % (ref 11.5–15.5)
WBC: 10.2 10*3/uL (ref 4.0–10.5)
nRBC: 0 % (ref 0.0–0.2)

## 2019-12-30 LAB — URINALYSIS, ROUTINE W REFLEX MICROSCOPIC
Bilirubin Urine: NEGATIVE
Glucose, UA: NEGATIVE mg/dL
Hgb urine dipstick: NEGATIVE
Ketones, ur: NEGATIVE mg/dL
Leukocytes,Ua: NEGATIVE
Nitrite: NEGATIVE
Protein, ur: NEGATIVE mg/dL
Specific Gravity, Urine: 1.014 (ref 1.005–1.030)
pH: 5 (ref 5.0–8.0)

## 2019-12-30 LAB — COMPREHENSIVE METABOLIC PANEL
ALT: 46 U/L — ABNORMAL HIGH (ref 0–44)
AST: 30 U/L (ref 15–41)
Albumin: 3.9 g/dL (ref 3.5–5.0)
Alkaline Phosphatase: 91 U/L (ref 38–126)
Anion gap: 13 (ref 5–15)
BUN: 8 mg/dL (ref 6–20)
CO2: 25 mmol/L (ref 22–32)
Calcium: 9.5 mg/dL (ref 8.9–10.3)
Chloride: 101 mmol/L (ref 98–111)
Creatinine, Ser: 0.82 mg/dL (ref 0.44–1.00)
GFR calc Af Amer: >60 mL/min (ref >60)
GFR calc non Af Amer: >60 mL/min (ref >60)
Glucose, Bld: 119 mg/dL — ABNORMAL HIGH (ref 70–99)
Potassium: 4.2 mmol/L (ref 3.5–5.1)
Sodium: 139 mmol/L (ref 135–145)
Total Bilirubin: 0.4 mg/dL (ref 0.3–1.2)
Total Protein: 7.4 g/dL (ref 6.5–8.1)

## 2019-12-30 MED ORDER — KETOROLAC TROMETHAMINE 30 MG/ML IJ SOLN
30.0000 mg | Freq: Once | INTRAMUSCULAR | Status: AC
  Administered 2019-12-30: 30 mg via INTRAMUSCULAR
  Filled 2019-12-30: qty 1

## 2019-12-30 MED ORDER — TRAMADOL HCL 50 MG PO TABS: 50.0000 mg | ORAL_TABLET | Freq: Four times a day (QID) | ORAL | 0 refills | Status: AC | PRN

## 2019-12-30 NOTE — Discharge Instructions (Signed)

## 2019-12-30 NOTE — MAU Note (Signed)
Pt presents to MAU with left lower quadrant pain that began on Monday, September 13th. Pt reports the pain was initially dull but last night she began experiencing intermittent sharp pain. Pt reports she took Gas-X on Wednesday because she initially thought it was constipation but it did not help any. Pt reports one episode of diarrhea today and states she experienced this the last time she had an ovarian cyst.   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

## 2019-12-30 NOTE — MAU Provider Note (Addendum)
History     CSN: [id number]  Arrival date and time: 12/30/19 2102  First Provider Initiated Contact with Patient 12/30/19 2137     Chief Complaint  Patient presents with  . Pain    left ovarian pain   HPI Norman Bier is a 24 y.o. female who reports to MAU with chief complaint of left mid-abdominal pain. This is a new problem, onset Monday 12/26/2019 and worsening over time. Patient endorses remote history of an ovarian cyst and her current pain is similar. Pain score is 6/10 and "sharp".  She attempted management with Gas-X, endorses flatus and a large bowel movement but negligible improvement in pain.  PMH is significant for Mayer-Rokitansky-Kuster-Hauser syndrome. Patient has a PCP and her next appointment is 09/22.   OB History   No obstetric history on file.     Past Medical History:  Diagnosis Date  . Allergic rhinitis   . Anxiety   . Asthma    ?excercise only  . Bipolar 1 disorder ([facility])   . Hypercholesteremia   . OSA (obstructive sleep apnea) 02/02/2019  . Ovarian cyst     Past Surgical History:  Procedure Laterality Date  . ABDOMINAL SURGERY    . VAGINOPLASTY    . WISDOM TOOTH EXTRACTION Bilateral     Family History  Problem Relation Age of Onset  . Bipolar disorder Mother   . Hypertension Father   . Bipolar disorder Maternal Grandfather     Social History   Tobacco Use  . Smoking status: Never Smoker  . Smokeless tobacco: Never Used  Substance Use Topics  . Alcohol use: Never  . Drug use: Never    Allergies: No Known Allergies  Medications Prior to Admission  Medication Sig Dispense Refill Last Dose  . busPIRone (BUSPAR) 10 MG tablet Take 10 mg by mouth 3 (three) times daily.   12/30/2019 at Unknown time  . cetirizine (ZYRTEC) 10 MG tablet Take 1 tablet by mouth daily.   12/30/2019 at Unknown time  . Cyanocobalamin (VITAMIN B-12 PO) Take by mouth.   12/29/2019 at Unknown time  . Lurasidone HCl (LATUDA) 60 MG TABS Take by mouth.   12/30/2019 at  Unknown time  . VASCEPA 1 g capsule TAKE 2 CAPSULES (2 G TOTAL) BY MOUTH 2 (TWO) TIMES DAILY. 120 capsule 2 12/30/2019 at Unknown time  . colesevelam (WELCHOL) 625 MG tablet Take 2 tablets by mouth 2 (two) times daily.     . cyclobenzaprine (FLEXERIL) 10 MG tablet Take 1 tablet (10 mg total) by mouth 3 (three) times daily as needed. (Patient not taking: Reported on 06/16/2019) 30 tablet 0   . diclofenac (VOLTAREN) 75 MG EC tablet Take 1 tablet (75 mg total) by mouth 2 (two) times daily. (Patient not taking: Reported on 06/16/2019) 30 tablet 0   . levalbuterol (XOPENEX HFA) 45 MCG/ACT inhaler Inhale 1 puff into the lungs every 4 (four) hours as needed for wheezing.     . meloxicam (MOBIC) 15 MG tablet Take 0.5-1 tablets (7.5-15 mg total) by mouth daily as needed for pain. 30 tablet 6   . traMADol (ULTRAM) 50 MG tablet Take 1 tablet (50 mg total) by mouth every 6 (six) hours as needed. (Patient not taking: Reported on 06/16/2019) 12 tablet 0     Review of Systems  Gastrointestinal: Positive for abdominal pain and diarrhea. Negative for nausea and vomiting.       Diarrhea x 1 episode  Genitourinary: Negative for dysuria and flank pain.  Musculoskeletal:  Negative for back pain.  All other systems reviewed and are negative.  Physical Exam   Blood pressure (!) 122/94, pulse 91, temperature 98.6 F (37 C), temperature source Oral, resp. rate 17, SpO2 100 %.  Physical Exam Constitutional:      General: She is not in acute distress.    Appearance: She is obese. She is not ill-appearing, toxic-appearing or diaphoretic.  HENT:     Mouth/Throat:     Mouth: Mucous membranes are moist.  Cardiovascular:     Rate and Rhythm: Normal rate.     Pulses: Normal pulses.     Heart sounds: Normal heart sounds.  Pulmonary:     Effort: Pulmonary effort is normal.     Breath sounds: Normal breath sounds.  Abdominal:     Tenderness: There is abdominal tenderness. There is no right CVA tenderness, left CVA  tenderness or guarding.     Comments: Generalized TTP  Skin:    General: Skin is dry.     Capillary Refill: Capillary refill takes less than 2 seconds.  Neurological:     General: No focal deficit present.     Mental Status: She is alert.  Psychiatric:        Mood and Affect: Mood normal.     MAU Course  Procedures   --ROS, plan of care and results discussed with Dr. Despina Hidden. No additional workup in MAU indicated at this time.  Orders Placed This Encounter  Procedures  . US Abdomen Complete  . US PELVIS (TRANSABDOMINAL ONLY)  . Urinalysis, Routine w reflex microscopic Urine, Clean Catch  . CBC  . Comprehensive metabolic panel   Patient Vitals for the past 24 hrs:  BP Temp Temp src Pulse Resp SpO2 Height Weight  12/30/19 2335 125/79 -- -- 92 -- -- -- --  12/30/19 2141 (!) 122/94 -- -- 91 -- -- -- --  12/30/19 2120 126/86 98.6 F (37 C) Oral 93 17 100 % 5\' 4"  (1.626 m) 108.4 kg   Results for orders placed or performed during the hospital encounter of 12/30/19 (from the past 24 hour(s))  Urinalysis, Routine w reflex microscopic Urine, Clean Catch     Status: Abnormal   Collection Time: 12/30/19  9:27 PM  Result Value Ref Range   Color, Urine YELLOW YELLOW   APPearance HAZY (A) CLEAR   Specific Gravity, Urine 1.014 1.005 - 1.030   pH 5.0 5.0 - 8.0   Glucose, UA NEGATIVE NEGATIVE mg/dL   Hgb urine dipstick NEGATIVE NEGATIVE   Bilirubin Urine NEGATIVE NEGATIVE   Ketones, ur NEGATIVE NEGATIVE mg/dL   Protein, ur NEGATIVE NEGATIVE mg/dL   Nitrite NEGATIVE NEGATIVE   Leukocytes,Ua NEGATIVE NEGATIVE  CBC     Status: Abnormal   Collection Time: 12/30/19  9:41 PM  Result Value Ref Range   WBC 10.2 4.0 - 10.5 K/uL   RBC 5.37 (H) 3.87 - 5.11 MIL/uL   Hemoglobin 13.5 12.0 - 15.0 g/dL   HCT 01/01/20 36 - 46 %   MCV 77.7 (L) 80.0 - 100.0 fL   MCH 25.1 (L) 26.0 - 34.0 pg   MCHC 32.4 30.0 - 36.0 g/dL   RDW 39.7 67.3 - 41.9 %   Platelets 464 (H) 150 - 400 K/uL   nRBC 0.0 0.0 - 0.2 %   Comprehensive metabolic panel     Status: Abnormal   Collection Time: 12/30/19  9:41 PM  Result Value Ref Range   Sodium 139 135 - 145 mmol/L  Potassium 4.2 3.5 - 5.1 mmol/L   Chloride 101 98 - 111 mmol/L   CO2 25 22 - 32 mmol/L   Glucose, Bld 119 (H) 70 - 99 mg/dL   BUN 8 6 - 20 mg/dL   Creatinine, Ser 1.82 0.44 - 1.00 mg/dL   Calcium 9.5 8.9 - 99.3 mg/dL   Total Protein 7.4 6.5 - 8.1 g/dL   Albumin 3.9 3.5 - 5.0 g/dL   AST 30 15 - 41 U/L   ALT 46 (H) 0 - 44 U/L   Alkaline Phosphatase 91 38 - 126 U/L   Total Bilirubin 0.4 0.3 - 1.2 mg/dL   GFR calc non Af Amer >60 >60 mL/min   GFR calc Af Amer >60 >60 mL/min   Anion gap 13 5 - 15   US Abdomen Complete  Result Date: 12/30/2019 CLINICAL DATA:  Left upper quadrant pain EXAM: ABDOMEN ULTRASOUND COMPLETE COMPARISON:  None. FINDINGS: Gallbladder: No gallstones or wall thickening visualized. No sonographic Murphy sign noted by sonographer. Common bile duct: Diameter: 4 mm Liver: No focal lesion identified. Within normal limits in parenchymal echogenicity. Portal vein is patent on color Doppler imaging with normal direction of blood flow towards the liver. IVC: No abnormality visualized. Pancreas: Suboptimally visualized Spleen: Size and appearance within normal limits. Right Kidney: Length: 9.5 cm. Echogenicity within normal limits. No mass or hydronephrosis visualized. Left Kidney: Length: 11.6 cm. Echogenicity within normal limits. No mass or hydronephrosis visualized. Abdominal aorta: No aneurysm visualized. Other findings: None. IMPRESSION: Somewhat limited examination, however within the visualized portions normal abdominal ultrasound. Electronically Signed   By: Jonna Clark M.D.   On: 12/30/2019 22:38   US PELVIS (TRANSABDOMINAL ONLY)  Result Date: 12/30/2019 CLINICAL DATA:  Left upper quadrant pain, uterine agenesis EXAM: TRANSABDOMINAL ULTRASOUND OF PELVIS TECHNIQUE: Transabdominal ultrasound examination of the pelvis was performed  including evaluation of the uterus, ovaries, adnexal regions, and pelvic cul-de-sac. 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 Endometrium Absent Right ovary Nonvisualization of the right ovary. Poorly visualized on prior ultrasound as well though seen on comparison CT images. Left ovary Measurements: 1.6 x 1.6 x 1.7 cm = volume: 2.3 mL. Grossly normal appearance without concerning adnexal lesion. Other findings:  No abnormal free fluid. 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 Nonvisualization of the right ovary. Otherwise unremarkable pelvic ultrasound with a grossly normal appearance of the left ovary. Electronically Signed   By: Kreg Shropshire M.D.   On: 12/30/2019 23:14   Meds ordered this encounter  Medications  . ketorolac (TORADOL) 30 MG/ML injection 30 mg  . traMADol (ULTRAM) 50 MG tablet    Sig: Take 1 tablet (50 mg total) by mouth every 6 (six) hours as needed for up to 5 days for severe pain.    Dispense:  20 tablet    Refill:  0    Order Specific Question:   Supervising Provider    Answer:   Lazaro Arms [[id number]]   Assessment and Plan  --24 y.o.  --No concerning findings on physical exam --No abnormal findings on labs, imaging performed today --Concern for recurrent musculoskeletal pain not responsive to Toradol --Discharge home in stable condition  F/U: --Has PCP appt 01/04/2020  Calvert Cantor, CNM 12/30/2019, 3:32 AM

## 2019-12-31 ENCOUNTER — Other Ambulatory Visit: Payer: Self-pay | Admitting: Family Medicine

## 2020-01-02 NOTE — Telephone Encounter (Signed)
Left message for pt to call me back 

## 2020-01-02 NOTE — Telephone Encounter (Signed)
Is this okay to refill? She used to be on this med but I do not currently see on her med list

## 2020-01-02 NOTE — Telephone Encounter (Signed)
Let's see if she is still taking it and have we checked her cholesterol lately?

## 2020-01-02 NOTE — Telephone Encounter (Signed)
Pt has been taking this once a day as that what she thought the instructions said but it should say 2 tablets twice a day. She will get lipids checked on Wednesday and we can refill then

## 2020-01-04 ENCOUNTER — Encounter: Payer: Self-pay | Admitting: Family Medicine

## 2020-01-04 ENCOUNTER — Ambulatory Visit (INDEPENDENT_AMBULATORY_CARE_PROVIDER_SITE_OTHER): Payer: BLUE CROSS/BLUE SHIELD | Admitting: Family Medicine

## 2020-01-04 VITALS — BP 120/70 | HR 83 | Temp 98.0°F | Wt 238.6 lb

## 2020-01-04 DIAGNOSIS — E781 Pure hyperglyceridemia: Secondary | ICD-10-CM | POA: Diagnosis not present

## 2020-01-04 DIAGNOSIS — R1084 Generalized abdominal pain: Secondary | ICD-10-CM | POA: Diagnosis not present

## 2020-01-04 DIAGNOSIS — R140 Abdominal distension (gaseous): Secondary | ICD-10-CM | POA: Diagnosis not present

## 2020-01-04 DIAGNOSIS — E7800 Pure hypercholesterolemia, unspecified: Secondary | ICD-10-CM

## 2020-01-04 NOTE — Patient Instructions (Signed)
Try taking a probiotic over-the-counter for the next 4 weeks.  There are several brands including Align, Digestive Health, etc.  Eat a low fiber and low sugar diet for the next couple of weeks. Avoid carbonated beverages as well.   Make sure your CPAP is fitting properly.   Follow up if worsening or any new symptoms arise.   I will be in touch with your lab results.     Low-Fiber Eating Plan Fiber is found in fruits, vegetables, whole grains, and beans. Eating a diet low in fiber helps to reduce how often you have bowel movements and how much you produce during a bowel movement. A low-fiber eating plan may help your digestive system heal if:  You have certain conditions, such as Crohn's disease or diverticulitis.  You recently had radiation therapy on your pelvis or bowel.  You recently had intestinal surgery.  You have a new surgical opening in your abdomen (colostomy or ileostomy).  Your intestine is narrowed (stricture). Your health care provider will determine how long you need to stay on this diet. Your health care provider may recommend that you work with a diet and nutrition specialist (dietitian). What are tips for following this plan? General guidelines  Follow recommendations from your dietitian about how much fiber you should have each day.  Most people on this eating plan should try to eat less than 10 grams (g) of fiber each day. Your daily fiber goal is _________________ g.  Take vitamin and mineral supplements as told by your health care provider or dietitian. Chewable or liquid forms are best when on this eating plan. Reading food labels  Check food labels for the amount of dietary fiber.  Choose foods that have less than 2 grams of fiber in one serving. Cooking  Use white flour and other allowed grains for baking and cooking.  Cook meat using methods that keep it tender, such as braising or poaching.  Cook eggs until the yolk is completely solid.  Cook  with healthy oils, such as olive oil or canola oil. Meal planning   Eat 5-6 small meals throughout the day instead of 3 large meals.  If you are lactose intolerant: ? Choose low-lactose dairy foods. ? Do not eat dairy foods, if told by your dietitian.  Limit fat and oils to less than 8 teaspoons a day.  Eat small portions of desserts. What foods are allowed? The items listed below may not be a complete list. Talk with your dietitian about what dietary choices are best for you. Grains All bread and crackers made with white flour. Waffles, pancakes, and [place] toast. Bagels. Pretzels. Melba toast, zwieback, and matzoh. Cooked and dried cereals that do not contain whole grains, added fiber, seeds, or dried fruit. CornmealDenzil Magnuson. Hot and cold cereals made with refined corn, wheat, rice, or oats. Plain pasta and noodles. White rice. Vegetables Well-cooked or canned vegetables without skin, seeds, or stems. Cooked potatoes without skins. Vegetable juice. Fruits Soft-cooked or canned fruits without skin and seeds. Peeled ripe banana. Applesauce. Fruit juice without pulp. Meats and other protein foods Ground meat. Tender cuts of meat or poultry. Eggs. Fish, seafood, and shellfish. Smooth nut butters. Tofu. Dairy All milk products and drinks. Lactose-free milks, including rice, soy, and almond milks. Yogurt without fruit, nuts, chocolate, or granola mix-ins. Sour cream. Cottage cheese. Cheese. Beverages Decaf coffee. Fruit and vegetable juices or smoothies (in small amounts, with no pulp or skins, and with fruits from allowed list). Sports drinks.  Herbal tea. Fats and oils Olive oil, canola oil, sunflower oil, flaxseed oil, and grapeseed oil. Mayonnaise. Cream cheese. Margarine. Butter. Sweets and desserts Plain cakes and cookies. Cream pies and pies made with allowed fruits. Pudding. Custard. Fruit gelatin. Sherbet. Popsicles. Ice cream without nuts. Plain hard candy. Honey. Jelly. Molasses.  Syrups, including chocolate syrup. Chocolate. Marshmallows. Gumdrops. Seasoning and other foods Bouillon. Broth. Cream soups made from allowed foods. Strained soup. Casseroles made with allowed foods. Ketchup. Mild mustard. Mild salad dressings. Plain gravies. Vinegar. Spices in moderation. Salt. Sugar. What foods are not allowed? The items listed below may not be a complete list. Talk with your dietitian about what dietary choices are best for you. Grains Whole wheat and whole grain breads and crackers. Multigrain breads and crackers. Rye bread. Whole grain or multigrain cereals. Cereals with nuts, raisins, or coconut. Bran. Coarse wheat cereals. Granola. High-fiber cereals. Cornmeal or corn bread. Whole grain pasta. Wild or brown rice. Quinoa. Popcorn. Buckwheat. Wheat germ. Vegetables Potato skins. Raw or undercooked vegetables. All beans and bean sprouts. Cooked greens. Corn. Peas. Cabbage. Beets. Broccoli. Brussels sprouts. Cauliflower. Mushrooms. Onions. Peppers. Parsnips. Okra. Sauerkraut. Fruit Raw or dried fruit. Berries. Fruit juice with pulp. Prune juice. Meats and other protein foods Tough, fibrous meats with gristle. Fatty meat. Poultry with skin. Fried meat, Environmental education officer, or fish. Deli or lunch meats. Sausage, bacon, and hot dogs. Nuts and chunky nut butter. Dried peas, beans, and lentils. Dairy Yogurt with fruit, nuts, chocolate, or granola mix-ins. Beverages Caffeinated coffee and teas. Fats and oils Avocado. Coconut. Sweets and desserts Desserts, cookies, or candies that contain nuts or coconut. Dried fruit. Jams and preserves with seeds. Marmalade. Any dessert made with fruits or grains that are not allowed. Seasoning and other foods Corn tortilla chips. Soups made with vegetables or grains that are not allowed. Relish. Horseradish. Rosita Fire. Olives. Summary  Most people on a low-fiber eating plan should eat less than 10 grams of fiber a day. Follow recommendations from your  dietitian about how much fiber you should have each day.  Always check food labels to see the dietary fiber content of packaged foods. In general, a low-fiber food will have fewer than 2 grams of fiber per serving.  In general, try to avoid whole grains, raw fruits and vegetables, dried fruit, tough cuts of meat, nuts, and seeds.  Take a vitamin and mineral supplement as told by your health care provider or dietitian. This information is not intended to replace advice given to you by your health care provider. Make sure you discuss any questions you have with your health care provider. Document Revised: 07/23/2018 Document Reviewed: 06/03/2016 Elsevier Patient Education  2020 [employer].

## 2020-01-04 NOTE — Progress Notes (Signed)
   Subjective:    Patient ID: Linda Chambers, female    DOB: 1995-06-03, 24 y.o.   MRN: [id number]  HPI Chief Complaint  Patient presents with  . bloated and gassy    bloated and gassy over 2 weeks, [facility] to pain possible lactose. recheck lipids   Complains of a 9 day history of abdominal bloating and generalized abdomina pain that she describes as a dull pressure. Eating makes her pain worse.  Heating pad helps. Having a bowel movements as usual and her pain is always alleviated afterwards.  Early satiety. Appetite is slightly decreased.  No history of gluten or lactose intolerance.   Denies fever, chills, dizziness, chest pain, palpitations, shortness of breath,  N/V/D, urinary symptoms, LE edema.    She went to the [name] for left sided abdominal pain. US done and nothing found. She was given Tramadol but this did not help with the pain.   US showed normal gallbladder.   Fasting today and would like to have her cholesterol checked.  She is taking Vascepa.  States her psychiatrist told her that [place] causes an increase in lipids.  Her right shoulder is doing much better. Finished PT.    Review of Systems Pertinent positives and negatives in the history of present illness.     Objective:   Physical Exam BP 120/70   Pulse 83   Temp 98 F (36.7 C)   Wt 238 lb 9.6 oz (108.2 kg)   BMI 40.96 kg/m   Alert and in no distress.  Cardiac exam shows a regular sinus rhythm without murmurs or gallops. Lungs are clear to auscultation.  Abdomen is soft, nondistended, normal bowel sounds, nontender, no guarding or rebound and no palpable masses.  Skin is warm and dry       Assessment & Plan:  Generalized abdominal pain - Plan: CBC with Differential/Platelet, Comprehensive metabolic panel  Bloating  Hypertriglyceridemia - Plan: Lipid panel  Hypercholesteremia - Plan: Lipid panel  No sign of infection. Discussed that her abdominal  exam is unremarkable.  No sign of an acute infection.  Reviewed recent ultrasound which was done at the [name] last week which showed a normal-appearing gallbladder and liver.  She did have mildly elevated ALT.  Does not consume alcohol regularly. Recommend cutting back on carbonated beverages and reducing the fiber in her diet.  Discussed eliminating dairy or gluten if she would like.  I also recommend that she try an over-the-counter probiotic for the next 4 weeks. She does use a CPAP and will make sure the pressure and mask are functioning properly. I will check her cholesterol.  States prolonged Latuda use may be the cause of her elevated triglycerides and cholesterol per her psychiatrist.  Continue Vascepa.

## 2020-01-05 LAB — CBC WITH DIFFERENTIAL/PLATELET
Basophils Absolute: 0 10*3/uL (ref 0.0–0.2)
Basos: 1 %
EOS (ABSOLUTE): 0.2 10*3/uL (ref 0.0–0.4)
Eos: 2 %
Hematocrit: 41.4 % (ref 34.0–46.6)
Hemoglobin: 13.7 g/dL (ref 11.1–15.9)
Immature Grans (Abs): 0 10*3/uL (ref 0.0–0.1)
Immature Granulocytes: 0 %
Lymphocytes Absolute: 2.9 10*3/uL (ref 0.7–3.1)
Lymphs: 39 %
MCH: 25.2 pg — ABNORMAL LOW (ref 26.6–33.0)
MCHC: 33.1 g/dL (ref 31.5–35.7)
MCV: 76 fL — ABNORMAL LOW (ref 79–97)
Monocytes Absolute: 0.5 10*3/uL (ref 0.1–0.9)
Monocytes: 7 %
Neutrophils Absolute: 3.8 10*3/uL (ref 1.4–7.0)
Neutrophils: 51 %
Platelets: 421 10*3/uL (ref 150–450)
RBC: 5.43 x10E6/uL — ABNORMAL HIGH (ref 3.77–5.28)
RDW: 15.1 % (ref 11.7–15.4)
WBC: 7.5 10*3/uL (ref 3.4–10.8)

## 2020-01-05 LAB — COMPREHENSIVE METABOLIC PANEL
ALT: 47 IU/L — ABNORMAL HIGH (ref 0–32)
AST: 28 IU/L (ref 0–40)
Albumin/Globulin Ratio: 1.5 (ref 1.2–2.2)
Albumin: 4.4 g/dL (ref 3.9–5.0)
Alkaline Phosphatase: 98 IU/L (ref 44–121)
BUN/Creatinine Ratio: 10 (ref 9–23)
BUN: 8 mg/dL (ref 6–20)
Bilirubin Total: 0.3 mg/dL (ref 0.0–1.2)
CO2: 25 mmol/L (ref 20–29)
Calcium: 10.1 mg/dL (ref 8.7–10.2)
Chloride: 101 mmol/L (ref 96–106)
Creatinine, Ser: 0.81 mg/dL (ref 0.57–1.00)
GFR calc Af Amer: 119 mL/min/{1.73_m2} (ref >59)
GFR calc non Af Amer: 103 mL/min/{1.73_m2} (ref >59)
Globulin, Total: 3 g/dL (ref 1.5–4.5)
Glucose: 106 mg/dL — ABNORMAL HIGH (ref 65–99)
Potassium: 4.7 mmol/L (ref 3.5–5.2)
Sodium: 139 mmol/L (ref 134–144)
Total Protein: 7.4 g/dL (ref 6.0–8.5)

## 2020-01-05 LAB — LIPID PANEL
Chol/HDL Ratio: 4.9 ratio — ABNORMAL HIGH (ref 0.0–4.4)
Cholesterol, Total: 204 mg/dL — ABNORMAL HIGH (ref 100–199)
HDL: 42 mg/dL (ref >39)
LDL Chol Calc (NIH): 126 mg/dL — ABNORMAL HIGH (ref 0–99)
Triglycerides: 205 mg/dL — ABNORMAL HIGH (ref 0–149)
VLDL Cholesterol Cal: 36 mg/dL (ref 5–40)

## 2020-01-05 MED ORDER — ICOSAPENT ETHYL 1 G PO CAPS: 2.0000 g | ORAL_CAPSULE | Freq: Two times a day (BID) | ORAL | 5 refills | Status: DC

## 2020-01-05 NOTE — Addendum Note (Signed)
Addended by: Herminio Commons A on: 01/05/2020 12:16 PM   Modules accepted: Orders

## 2020-01-05 NOTE — Telephone Encounter (Signed)
Sent in vascepa since lipids came back

## 2020-01-07 LAB — HEPATITIS PANEL, ACUTE
Hep A IgM: NEGATIVE
Hep B C IgM: NEGATIVE
Hep C Virus Ab: <0.1 s/co ratio (ref 0.0–0.9)
Hepatitis B Surface Ag: NEGATIVE

## 2020-01-07 LAB — SPECIMEN STATUS REPORT

## 2020-01-24 ENCOUNTER — Telehealth: Payer: Self-pay | Admitting: Internal Medicine

## 2020-01-24 DIAGNOSIS — R1084 Generalized abdominal pain: Secondary | ICD-10-CM

## 2020-01-24 DIAGNOSIS — R140 Abdominal distension (gaseous): Secondary | ICD-10-CM

## 2020-01-24 NOTE — Telephone Encounter (Signed)
Pt called and says she is still having gas and bloating, she notices it more after a meal. She is doing the low fiber diet and probiotics as recomennded but wanting to know what else is there to do? She is uncomfortable. Please advise

## 2020-01-24 NOTE — Telephone Encounter (Signed)
Let's refer her to GI if she is ok with this so they can help [place] figure this out. Her abdominal US did not show anything wrong with her gallbladder or give [place] any explanation.

## 2020-01-25 NOTE — Telephone Encounter (Signed)
Left message for pt to call me back 

## 2020-01-25 NOTE — Telephone Encounter (Signed)
Pt ok to go to GI I will put referral in. Pt wants to know can she stop the low fiber diet since not working

## 2020-01-25 NOTE — Telephone Encounter (Signed)
Yes, she can just try to avoid foods that cause her discomfort until she sees GI

## 2020-01-25 NOTE — Addendum Note (Signed)
Addended by: Herminio Commons A on: 01/25/2020 09:00 AM   Modules accepted: Orders

## 2020-01-25 NOTE — Telephone Encounter (Signed)
Pt was notified of results

## 2020-02-03 ENCOUNTER — Ambulatory Visit: Payer: BLUE CROSS/BLUE SHIELD | Admitting: Family Medicine

## 2020-02-06 ENCOUNTER — Other Ambulatory Visit: Payer: BLUE CROSS/BLUE SHIELD

## 2020-02-06 ENCOUNTER — Encounter: Payer: Self-pay | Admitting: Internal Medicine

## 2020-02-06 ENCOUNTER — Ambulatory Visit (INDEPENDENT_AMBULATORY_CARE_PROVIDER_SITE_OTHER): Payer: BLUE CROSS/BLUE SHIELD | Admitting: Internal Medicine

## 2020-02-06 VITALS — BP 110/60 | HR 82 | Ht 64.0 in | Wt 241.0 lb

## 2020-02-06 DIAGNOSIS — R109 Unspecified abdominal pain: Secondary | ICD-10-CM | POA: Diagnosis not present

## 2020-02-06 DIAGNOSIS — R194 Change in bowel habit: Secondary | ICD-10-CM

## 2020-02-06 DIAGNOSIS — R140 Abdominal distension (gaseous): Secondary | ICD-10-CM

## 2020-02-06 MED ORDER — DICYCLOMINE HCL 10 MG PO CAPS: 10.0000 mg | ORAL_CAPSULE | ORAL | 1 refills | Status: DC | PRN

## 2020-02-06 NOTE — Patient Instructions (Signed)
Your provider has requested that you go to the basement level for lab work before leaving today. Press "B" on the elevator. The lab is located at the first door on the left as you exit the elevator.  You have been given some anti-gas information.  Keep up the good work with exercise and weight loss!

## 2020-02-06 NOTE — Progress Notes (Signed)
HISTORY OF PRESENT ILLNESS:  Linda Chambers is a 24 y.o. female, student at [employer], who presents today for evaluation of generalized abdominal discomfort of 2 months duration.  She describes bloating which is exacerbated by meals and borborygmi.  Bowel habits have changed from once daily to 2 or 3 times daily.  No diarrhea.  No bleeding.  As mentioned, symptoms are exacerbated by meals.  No true pain except for occasional issues with cramping.  Some transient relief with defecation.  Symptoms most prominent in the evening.  No problems or symptoms after falling asleep.  She has nausea but no vomiting.  She has gained about 10 pounds over the past 6 months.  She does exercise daily on the treadmill.  There is a family history of lactose intolerance for which she avoids dairy products.  It is recommended to her that she avoid fiber.  This did not help.  Actually, resulted in constipation.  She does have a history of symptomatic ovarian cyst.  Recent evaluation for her abdominal complaints included blood work September 2021.  Comprehensive metabolic panel was unremarkable except for mild elevation of glucose and ALT.  CBC unremarkable with hemoglobin 13.7.  Abdominal ultrasound was somewhat limited due to body habitus but no specific abnormalities noted.  No gallstones.  She did undergo contrast-enhanced CT scan of the abdomen and pelvis February 2020 to evaluate left-sided abdominal pain.  This revealed possible rupture of left ovarian cyst.  Current complaints are different than those which led to repeat CT scan.  She has completed her Covid vaccination series  REVIEW OF SYSTEMS:  All non-GI ROS negative unless otherwise noted in the HPI except for anxiety, depression, sleeping problems  Past Medical History:  Diagnosis Date  . Allergic rhinitis   . Anxiety   . Asthma    ?excercise only  . Bipolar 1 disorder (HCC)   . Hypercholesteremia   . OSA (obstructive sleep apnea) 02/02/2019  . Ovarian cyst      Past Surgical History:  Procedure Laterality Date  . ABDOMINAL SURGERY    . VAGINOPLASTY    . WISDOM TOOTH EXTRACTION Bilateral     Social History Linda Chambers  reports that she has never smoked. She has never used smokeless tobacco. She reports current alcohol use of about 2.0 standard drinks of alcohol per week. She reports that she does not use drugs.  family history includes Bipolar disorder in her maternal grandfather and mother; Hypertension in her father.  No Known Allergies     PHYSICAL EXAMINATION: Vital signs: BP 110/60   Pulse 82   Ht 5\' 4"  (1.626 m)   Wt 241 lb (109.3 kg)   SpO2 98%   BMI 41.37 kg/m   Constitutional: Pleasant, generally well-appearing, no acute distress Psychiatric: alert and oriented x3, cooperative Eyes: extraocular movements intact, anicteric, conjunctiva pink Mouth: oral pharynx moist, no lesions Neck: supple no lymphadenopathy Cardiovascular: heart regular rate and rhythm, no murmur Lungs: clear to auscultation bilaterally Abdomen: soft, obese, nontender, nondistended, no obvious ascites, no peritoneal signs, normal bowel sounds, no organomegaly Rectal: Omitted Extremities: no clubbing, cyanosis, or lower extremity edema bilaterally Skin: no lesions on visible extremities Neuro: No focal deficits.  Cranial nerves intact  ASSESSMENT:  1.  Postprandial bloating discomfort without alarm features 2.  Slight change in bowel habits with mild increase in frequency.  No diarrhea. 3.  Morbid obesity with weight gain 4.  History of symptomatic ovarian cyst rupture   PLAN:  1.  Check serologies  to screen for celiac disease 2.  Prescribe Bentyl 10 mg.  Take 1 or 2 every 4-6 hours as needed for cramping 3.  Anti-gas and flatulence dietary measures.  Provided 4.  Continue exercise 5.  Sustained weight loss 6.  GI follow-up as needed.

## 2020-02-07 LAB — TISSUE TRANSGLUTAMINASE, IGA: (tTG) Ab, IgA: <1 U/mL

## 2020-02-07 LAB — IGA: Immunoglobulin A: 88 mg/dL (ref 47–310)

## 2020-02-10 ENCOUNTER — Encounter: Payer: Self-pay | Admitting: Emergency Medicine

## 2020-02-10 ENCOUNTER — Other Ambulatory Visit: Payer: Self-pay

## 2020-02-10 DIAGNOSIS — J45909 Unspecified asthma, uncomplicated: Secondary | ICD-10-CM | POA: Diagnosis not present

## 2020-02-10 DIAGNOSIS — R1032 Left lower quadrant pain: Secondary | ICD-10-CM | POA: Diagnosis present

## 2020-02-10 DIAGNOSIS — N83209 Unspecified ovarian cyst, unspecified side: Secondary | ICD-10-CM | POA: Insufficient documentation

## 2020-02-10 LAB — URINALYSIS, COMPLETE (UACMP) WITH MICROSCOPIC
Bilirubin Urine: NEGATIVE
Glucose, UA: NEGATIVE mg/dL
Ketones, ur: NEGATIVE mg/dL
Leukocytes,Ua: NEGATIVE
Nitrite: NEGATIVE
Protein, ur: NEGATIVE mg/dL
Specific Gravity, Urine: 1.004 — ABNORMAL LOW (ref 1.005–1.030)
pH: 7 (ref 5.0–8.0)

## 2020-02-10 LAB — CBC
HCT: 40.4 % (ref 36.0–46.0)
Hemoglobin: 13.4 g/dL (ref 12.0–15.0)
MCH: 25.2 pg — ABNORMAL LOW (ref 26.0–34.0)
MCHC: 33.2 g/dL (ref 30.0–36.0)
MCV: 75.9 fL — ABNORMAL LOW (ref 80.0–100.0)
Platelets: 421 10*3/uL — ABNORMAL HIGH (ref 150–400)
RBC: 5.32 MIL/uL — ABNORMAL HIGH (ref 3.87–5.11)
RDW: 14.7 % (ref 11.5–15.5)
WBC: 12 10*3/uL — ABNORMAL HIGH (ref 4.0–10.5)
nRBC: 0 % (ref 0.0–0.2)

## 2020-02-10 MED ORDER — ONDANSETRON 4 MG PO TBDP
4.0000 mg | ORAL_TABLET | Freq: Once | ORAL | Status: AC | PRN
  Administered 2020-02-11: 4 mg via ORAL
  Filled 2020-02-10: qty 1

## 2020-02-10 MED ORDER — OXYCODONE-ACETAMINOPHEN 5-325 MG PO TABS
1.0000 | ORAL_TABLET | ORAL | Status: DC | PRN
  Administered 2020-02-10: 1 via ORAL
  Filled 2020-02-10: qty 1

## 2020-02-10 NOTE — ED Triage Notes (Signed)
Patient c/o LLQ abdominal pain. Patient reports hx of ruptured ovarian cyst to area, reports this feels the same.

## 2020-02-11 ENCOUNTER — Emergency Department: Payer: BLUE CROSS/BLUE SHIELD

## 2020-02-11 ENCOUNTER — Emergency Department
Admission: EM | Admit: 2020-02-11 | Discharge: 2020-02-11 | Disposition: A | Discharge status: Home / Self Care | Payer: BLUE CROSS/BLUE SHIELD | Attending: Emergency Medicine | Admitting: Emergency Medicine

## 2020-02-11 DIAGNOSIS — R102 Pelvic and perineal pain: Secondary | ICD-10-CM

## 2020-02-11 DIAGNOSIS — N83299 Other ovarian cyst, unspecified side: Secondary | ICD-10-CM

## 2020-02-11 DIAGNOSIS — R1032 Left lower quadrant pain: Secondary | ICD-10-CM

## 2020-02-11 LAB — HCG, QUANTITATIVE, PREGNANCY: hCG, Beta Chain, Quant, S: <1 m[IU]/mL (ref <5)

## 2020-02-11 LAB — COMPREHENSIVE METABOLIC PANEL
ALT: 47 U/L — ABNORMAL HIGH (ref 0–44)
AST: 30 U/L (ref 15–41)
Albumin: 4.3 g/dL (ref 3.5–5.0)
Alkaline Phosphatase: 84 U/L (ref 38–126)
Anion gap: 8 (ref 5–15)
BUN: 11 mg/dL (ref 6–20)
CO2: 27 mmol/L (ref 22–32)
Calcium: 9.2 mg/dL (ref 8.9–10.3)
Chloride: 101 mmol/L (ref 98–111)
Creatinine, Ser: 0.73 mg/dL (ref 0.44–1.00)
GFR, Estimated: >60 mL/min (ref >60)
Glucose, Bld: 98 mg/dL (ref 70–99)
Potassium: 3.7 mmol/L (ref 3.5–5.1)
Sodium: 136 mmol/L (ref 135–145)
Total Bilirubin: 0.7 mg/dL (ref 0.3–1.2)
Total Protein: 7.9 g/dL (ref 6.5–8.1)

## 2020-02-11 LAB — LIPASE, BLOOD: Lipase: 36 U/L (ref 11–51)

## 2020-02-11 MED ORDER — HYDROCODONE-ACETAMINOPHEN 5-325 MG PO TABS
2.0000 | ORAL_TABLET | Freq: Four times a day (QID) | ORAL | 0 refills | Status: DC | PRN
Start: 2020-02-11 — End: 2020-08-14

## 2020-02-11 MED ORDER — HYDROCODONE-ACETAMINOPHEN 5-325 MG PO TABS
1.0000 | ORAL_TABLET | Freq: Once | ORAL | Status: AC
  Administered 2020-02-11: 1 via ORAL
  Filled 2020-02-11: qty 1

## 2020-02-11 NOTE — ED Notes (Signed)
Reviewed discharge instructions, follow-up care, and prescriptions with patient. Patient verbalized understanding of all information reviewed. Patient stable, with no distress noted at this time.    

## 2020-02-11 NOTE — ED Provider Notes (Signed)
[facility] Emergency Department Provider Note   ____________________________________________   First MD Initiated Contact with Patient 02/11/20 [phone]     (approximate)  I have reviewed the triage vital signs and the nursing notes.   HISTORY  Chief Complaint Abdominal Pain    HPI Linda Chambers is a 24 y.o. 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  Patient states that this pain is similar to when she has had a ruptured ovarian cyst in the past.  Patient states that her vagina was surgically created and therefore she denies any vaginal bleeding.         Past Medical History:  Diagnosis Date  . Allergic rhinitis   . Anxiety   . Asthma    ?excercise only  . Bipolar 1 disorder (HCC)   . Hypercholesteremia   . OSA (obstructive sleep apnea) 02/02/2019  . Ovarian cyst     Patient Active Problem List   Diagnosis Date Noted  . OSA (obstructive sleep apnea) 02/02/2019  . Hx of ovarian cyst 05/21/2018  . 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 Hypertriglyceridemia 05/19/2018  . Asthma   . Bipolar 1 disorder (HCC)   . Hypercholesteremia   . Anxiety   . Allergic rhinitis     Past Surgical History:  Procedure Laterality Date  . ABDOMINAL SURGERY    . VAGINOPLASTY    . WISDOM TOOTH EXTRACTION Bilateral     Prior to Admission medications   Medication Sig Start Date End Date Taking? Authorizing Provider  busPIRone (BUSPAR) 10 MG tablet Take 10 mg by mouth 2 (two) times daily.     [provider]  cetirizine (ZYRTEC) 10 MG tablet Take 1 tablet by mouth daily.    [provider]  Cyanocobalamin (VITAMIN B-12 PO) Take  by mouth.    [provider]  dicyclomine (BENTYL) 10 MG capsule Take 1-2 capsules (10-20 mg total) by mouth every 4 (four) hours as needed for spasms. 02/06/20   Hilarie Fredrickson, MD  HYDROcodone-acetaminophen (NORCO) 5-325 MG tablet Take 2 tablets by mouth every 6 (six) hours as needed for moderate pain. 02/11/20   Merwyn Katos, MD  icosapent Ethyl (VASCEPA) 1 g capsule Take 2 capsules (2 g total) by mouth 2 (two) times daily. 01/05/20   Henson, [name] L, NP-C  levalbuterol (XOPENEX HFA) 45 MCG/ACT inhaler Inhale 1 puff into the lungs every 4 (four) hours as needed for wheezing. PRN when exercise    [provider]  Lurasidone HCl (LATUDA) 60 MG TABS Take by mouth.    [provider]  Multiple Vitamin (MULTIVITAMIN) tablet Take 1 tablet by mouth daily.    [provider]    Allergies Patient has no known allergies.  Family History  Problem Relation Age of Onset  . Bipolar disorder Mother   . Hypertension Father   . Bipolar disorder Maternal Grandfather     Social History Social History   Tobacco Use  . Smoking status: Never Smoker  . Smokeless tobacco: Never Used  Substance Use Topics  . Alcohol use: Yes    Alcohol/week: 2.0 standard drinks    Types: 2 Glasses of wine per week    Comment: 2  drinks a month  . Drug use: Never    Review of Systems Constitutional: No fever/chills Eyes: No visual changes. ENT: No sore throat. Cardiovascular: Denies chest pain. Respiratory: Denies shortness of breath. Gastrointestinal: Endorses abdominal pain.  No nausea, no vomiting.  No diarrhea. Genitourinary: Negative for dysuria. Musculoskeletal: Negative for acute arthralgias Skin: Negative for rash. Neurological: Negative for headaches, weakness/numbness/paresthesias in any extremity Psychiatric: Negative for suicidal ideation/homicidal ideation   ____________________________________________   PHYSICAL EXAM:  VITAL SIGNS: ED Triage Vitals    Enc Vitals Group     BP 02/10/20 2322 123/83     Pulse Rate 02/10/20 2322 (!) 104     Resp 02/10/20 2322 (!) 26     Temp 02/10/20 2322 98 F (36.7 C)     Temp src --      SpO2 02/10/20 2322 98 %     Weight 02/10/20 2323 240 lb 15.4 oz (109.3 kg)     Height 02/10/20 2323 5\' 4"  (1.626 m)     Head Circumference --      Peak Flow --      Pain Score 02/10/20 2323 9     Pain Loc --      Pain Edu? --      Excl. in GC? --    Constitutional: Alert and oriented. Well appearing and in no acute distress. Eyes: Conjunctivae are normal. PERRL. Head: Atraumatic. Nose: No congestion/rhinnorhea. Mouth/Throat: Mucous membranes are moist. Neck: No stridor Cardiovascular: Grossly normal heart sounds.  Good peripheral circulation. Respiratory: Normal respiratory effort.  No retractions. Gastrointestinal: Soft, mild left lower quadrant tenderness to palpation. No distention. Musculoskeletal: No obvious deformities Neurologic:  Normal speech and language. No gross focal neurologic deficits are appreciated. Skin:  Skin is warm and dry. No rash noted. Psychiatric: Mood and affect are normal. Speech and behavior are normal.  ____________________________________________   LABS (all labs ordered are listed, but only abnormal results are displayed)  Labs Reviewed  COMPREHENSIVE METABOLIC PANEL - Abnormal; Notable for the following components:      Result Value   ALT 47 (*)    All other components within normal limits  CBC - Abnormal; Notable for the following components:   WBC 12.0 (*)    RBC 5.32 (*)    MCV 75.9 (*)    MCH 25.2 (*)    Platelets 421 (*)    All other components within normal limits  URINALYSIS, COMPLETE (UACMP) WITH MICROSCOPIC - Abnormal; Notable for the following components:   Color, Urine STRAW (*)    APPearance CLEAR (*)    Specific Gravity, Urine 1.004 (*)    Hgb urine dipstick SMALL (*)    Bacteria, UA RARE (*)    All other components within normal limits  LIPASE,  BLOOD  HCG, QUANTITATIVE, PREGNANCY    RADIOLOGY  ED MD interpretation: Transabdominal ultrasound of the pelvis shows congenitally absent uterus with normal appearance of the right ovary and clear flow on Doppler ultrasound of the left ovary  Official radiology report(s): 02/12/20 Pelvis Complete  Result Date: 02/11/2020 CLINICAL DATA:  Left lower quadrant pain EXAM: TRANSABDOMINAL ULTRASOUND OF PELVIS DOPPLER ULTRASOUND OF OVARIES TECHNIQUE: Transabdominal ultrasound examination of the pelvis was performed including evaluation of the uterus, ovaries, adnexal regions, and pelvic cul-de-sac. Color and duplex Doppler ultrasound was utilized to evaluate blood flow to the ovaries. COMPARISON:  None. FINDINGS: Uterus Congenitally absent Endometrium Congenitally absent Right ovary Measurements: 3.8 x 1.2 x 2.1 cm = volume: 5 mL. Normal appearance/no  adnexal mass. Left ovary Not visualized Pulsed Doppler evaluation demonstrates normal low-resistance arterial and venous waveforms in the right ovary. Other: None IMPRESSION: 1. Normal appearance of the right ovary. 2. Nonvisualization of the left ovary. 3. Congenitally absent uterus. Electronically Signed   By: Deatra Robinson M.D.   On: 02/11/2020 00:51   [place] Art/Ven Flow Abd Pelv Doppler  Result Date: 02/11/2020 CLINICAL DATA:  Left lower quadrant pain EXAM: TRANSABDOMINAL ULTRASOUND OF PELVIS DOPPLER ULTRASOUND OF OVARIES TECHNIQUE: Transabdominal ultrasound examination of the pelvis was performed including evaluation of the uterus, ovaries, adnexal regions, and pelvic cul-de-sac. Color and duplex Doppler ultrasound was utilized to evaluate blood flow to the ovaries. COMPARISON:  None. FINDINGS: Uterus Congenitally absent Endometrium Congenitally absent Right ovary Measurements: 3.8 x 1.2 x 2.1 cm = volume: 5 mL. Normal appearance/no adnexal mass. Left ovary Not visualized Pulsed Doppler evaluation demonstrates normal low-resistance arterial and venous waveforms in  the right ovary. Other: None IMPRESSION: 1. Normal appearance of the right ovary. 2. Nonvisualization of the left ovary. 3. Congenitally absent uterus. Electronically Signed   By: Deatra Robinson M.D.   On: 02/11/2020 00:51    ____________________________________________   PROCEDURES  Procedure(s) performed (including Critical Care):  Procedures   ____________________________________________   INITIAL IMPRESSION / ASSESSMENT AND PLAN / ED COURSE  As part of my medical decision making, I reviewed the following data within the electronic MEDICAL RECORD NUMBER Nursing notes reviewed and incorporated, Labs reviewed, Old chart reviewed, Radiograph reviewed and Notes from prior ED visits reviewed and incorporated        Patients symptoms not typical for emergent causes of abdominal pain such as, but not limited to, appendicitis, abdominal aortic aneurysm, surgical biliary disease, pancreatitis, SBO, mesenteric ischemia, serious intra-abdominal bacterial illness. Presentation also not typical of gynecologic emergencies such as TOA, Ovarian Torsion, PID. Not Ectopic.  Possible ruptured ovarian cyst Doubt atypical ACS.  Pt tolerating PO. Disposition: Patient will be discharged with strict return precautions and follow up with primary MD within 12-24 hours for further evaluation. Patient understands that this still may have an early presentation of an emergent medical condition such as appendicitis that will require a recheck.      ____________________________________________   FINAL CLINICAL IMPRESSION(S) / ED DIAGNOSES  Final diagnoses:  Left lower quadrant abdominal pain  Physiological ovarian cysts  Pelvic pain in female     ED Discharge Orders         Ordered    HYDROcodone-acetaminophen (NORCO) 5-325 MG tablet  Every 6 hours PRN        02/11/20 0348           Note:  This document was prepared using Dragon voice recognition software and may include unintentional dictation  errors.   Merwyn Katos, MD 02/11/20 [phone]

## 2020-02-24 ENCOUNTER — Ambulatory Visit (INDEPENDENT_AMBULATORY_CARE_PROVIDER_SITE_OTHER): Payer: BLUE CROSS/BLUE SHIELD | Admitting: Obstetrics & Gynecology

## 2020-02-24 ENCOUNTER — Other Ambulatory Visit (HOSPITAL_COMMUNITY)
Admission: RE | Admit: 2020-02-24 | Discharge: 2020-02-24 | Disposition: A | Discharge status: Home / Self Care | Payer: BLUE CROSS/BLUE SHIELD | Source: Ambulatory Visit | Attending: Obstetrics & Gynecology | Admitting: Obstetrics & Gynecology

## 2020-02-24 ENCOUNTER — Other Ambulatory Visit: Payer: Self-pay

## 2020-02-24 ENCOUNTER — Encounter: Payer: Self-pay | Admitting: Obstetrics & Gynecology

## 2020-02-24 VITALS — BP 140/80 | Ht 64.0 in | Wt 239.0 lb

## 2020-02-24 DIAGNOSIS — Z113 Encounter for screening for infections with a predominantly sexual mode of transmission: Secondary | ICD-10-CM | POA: Insufficient documentation

## 2020-02-24 DIAGNOSIS — Q528 Other specified congenital malformations of female genitalia: Secondary | ICD-10-CM

## 2020-02-24 DIAGNOSIS — Z8742 Personal history of other diseases of the female genital tract: Secondary | ICD-10-CM | POA: Diagnosis not present

## 2020-02-24 NOTE — Patient Instructions (Signed)
Thank you for choosing [facility]. As part of our ongoing efforts to improve patient experience, we would appreciate your feedback. Please fill out the short survey that you will receive by mail or MyChart. Your opinion is important to us! -Dr Zephaniah Enyeart  

## 2020-02-24 NOTE — Progress Notes (Signed)
Gynecology Pelvic Pain Evaluation   Chief Complaint: LLQ pain  History of Present Illness:   Patient is a 24 y.o. G0 who LMP was No LMP recorded. 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  She complains of pain.   Her pain is localized to the LLQ area, described as intermittent, sharp and stabbing, began several weeks ago and its severity is described as moderate. The pain radiates to the  Non-radiating. She has these associated symptoms which include no other sx's.  She has had these 2 years ago.  Both times she has been seen w [place] and no cysts seen, but perhaps free fluid in pelvis.  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 Patient has these modifiers which include relaxation that make it better and unable to associate with any factor that make it worse. Pt has had hirsutism (mild) since she was a teen.  She reports more recent hot flash, occas, mild.  Pt has had previous vagino-plasty constructive surgery and is sexually active (same partner 3 yrs).  Concerned about future managament of cysts and pain.  She is known BRCA - 2019.  FH breast cancer M Aunt, MGM, MGGM.  PMHx: She  has a past medical history of Allergic rhinitis, Anxiety, Asthma, Bipolar 1 disorder ([place]), Hypercholesteremia, OSA (obstructive sleep apnea) (02/02/2019), and Ovarian cyst. Also,  has a past surgical history that includes Abdominal surgery; Wisdom tooth extraction (Bilateral); and Vaginoplasty., family history includes Bipolar disorder in her maternal grandfather and mother; Hypertension in her father.,  reports that she has never smoked. She has never used smokeless tobacco. She reports current alcohol use of about 2.0 standard drinks of alcohol per week. She reports that she does not use drugs.  She has a current medication list which includes the following prescription(s): buspirone, cetirizine, cyanocobalamin, dicyclomine, icosapent  ethyl, levalbuterol, lurasidone hcl, multivitamin, and hydrocodone-acetaminophen. Also, has No Known Allergies.  Review of Systems  Constitutional: Negative for chills, fever and malaise/fatigue.  HENT: Negative for congestion, sinus pain and sore throat.   Eyes: Negative for blurred vision and pain.  Respiratory: Negative for cough and wheezing.   Cardiovascular: Negative for chest pain and leg swelling.  Gastrointestinal: Positive for abdominal pain. Negative for constipation, diarrhea, heartburn, nausea and vomiting.  Genitourinary: Negative for dysuria, frequency, hematuria and urgency.  Musculoskeletal: Negative for back pain, joint pain, myalgias and neck pain.  Skin: Negative for itching and rash.  Neurological: Negative for dizziness, tremors and weakness.  Endo/Heme/Allergies: Does not bruise/bleed easily.  Psychiatric/Behavioral: Negative for depression. The patient is not nervous/anxious and does not have insomnia.     Objective: BP 140/80   Ht 5' 4" (1.626 m)   Wt 239 lb (108.4 kg)   BMI 41.02 kg/m  Physical Exam Constitutional:      General: She is not in acute distress.    Appearance: She is well-developed. She is obese.  Genitourinary:     Pelvic exam was performed with patient supine.     Vagina and uterus normal.     No vaginal erythema or bleeding.     No cervical motion tenderness, discharge, polyp or nabothian cyst.     Uterus is mobile.     Uterus is not enlarged.     No uterine mass detected.    Uterus is midaxial.     No right or left adnexal mass present.     Right  adnexa not tender.     Left adnexa not tender.     Genitourinary Comments: Narrow and shortened vaginal canal.  HENT:     Head: Normocephalic and atraumatic.     Nose: Nose normal.  Abdominal:     General: There is no distension.     Palpations: Abdomen is soft.     Tenderness: There is no abdominal tenderness.  Musculoskeletal:        General: Normal range of motion.  Neurological:       Mental Status: She is alert and oriented to person, place, and time.     Cranial Nerves: No cranial nerve deficit.  Skin:    General: Skin is warm and dry.  Psychiatric:        Attention and Perception: Attention normal.        Mood and Affect: Mood and affect normal.        Speech: Speech normal.        Behavior: Behavior normal.        Thought Content: Thought content normal.        Judgment: Judgment normal.   Female chaperone present for pelvic portion of the physical exam  Assessment: 24 y.o. with LLQ pain.  1. 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 Hx of ovarian cyst No evidence now, based on recent US (and past [place]) Does not seem to be PCOS based on [place] results Labs  3. Screen for STD (sexually transmitted disease) Sexually actve 3 1/2 yrs same partner  A total of 40 minutes were spent face-to-face with the patient as well as preparation, review, communication, and documentation during this encounter.    Barnett Applebaum, MD, [facility], [facility] 02/24/2020  1:57 PM

## 2020-02-25 LAB — FSH/LH
FSH: 4.5 m[IU]/mL
LH: 11.8 m[IU]/mL

## 2020-02-25 LAB — TSH: TSH: 3.24 u[IU]/mL (ref 0.450–4.500)

## 2020-02-26 LAB — TESTOSTERONE,FREE AND TOTAL
Testosterone, Free: 8.3 pg/mL — ABNORMAL HIGH (ref 0.0–4.2)
Testosterone: 64 ng/dL (ref 13–71)

## 2020-02-28 LAB — CERVICOVAGINAL ANCILLARY ONLY
Chlamydia: NEGATIVE
Comment: NEGATIVE
Comment: NEGATIVE
Comment: NORMAL
Neisseria Gonorrhea: NEGATIVE
Trichomonas: NEGATIVE

## 2020-03-21 ENCOUNTER — Emergency Department
Admission: EM | Admit: 2020-03-21 | Discharge: 2020-03-21 | Discharge status: Against Medical Advice | Payer: BLUE CROSS/BLUE SHIELD

## 2020-03-21 ENCOUNTER — Other Ambulatory Visit: Payer: Self-pay

## 2020-03-22 ENCOUNTER — Emergency Department (HOSPITAL_COMMUNITY): Payer: BLUE CROSS/BLUE SHIELD

## 2020-03-22 ENCOUNTER — Other Ambulatory Visit: Payer: Self-pay

## 2020-03-22 ENCOUNTER — Emergency Department (HOSPITAL_COMMUNITY)
Admission: EM | Admit: 2020-03-22 | Discharge: 2020-03-22 | Disposition: A | Discharge status: Home / Self Care | Payer: BLUE CROSS/BLUE SHIELD | Attending: Emergency Medicine | Admitting: Emergency Medicine

## 2020-03-22 ENCOUNTER — Encounter (HOSPITAL_COMMUNITY): Payer: Self-pay | Admitting: *Deleted

## 2020-03-22 DIAGNOSIS — F1010 Alcohol abuse, uncomplicated: Secondary | ICD-10-CM | POA: Diagnosis not present

## 2020-03-22 DIAGNOSIS — J45909 Unspecified asthma, uncomplicated: Secondary | ICD-10-CM | POA: Insufficient documentation

## 2020-03-22 DIAGNOSIS — R1031 Right lower quadrant pain: Secondary | ICD-10-CM | POA: Diagnosis not present

## 2020-03-22 LAB — CBC
HCT: 41.2 % (ref 36.0–46.0)
Hemoglobin: 13.6 g/dL (ref 12.0–15.0)
MCH: 25.6 pg — ABNORMAL LOW (ref 26.0–34.0)
MCHC: 33 g/dL (ref 30.0–36.0)
MCV: 77.4 fL — ABNORMAL LOW (ref 80.0–100.0)
Platelets: 410 10*3/uL — ABNORMAL HIGH (ref 150–400)
RBC: 5.32 MIL/uL — ABNORMAL HIGH (ref 3.87–5.11)
RDW: 14.9 % (ref 11.5–15.5)
WBC: 9.5 10*3/uL (ref 4.0–10.5)
nRBC: 0 % (ref 0.0–0.2)

## 2020-03-22 LAB — COMPREHENSIVE METABOLIC PANEL
ALT: 35 U/L (ref 0–44)
AST: 20 U/L (ref 15–41)
Albumin: 3.5 g/dL (ref 3.5–5.0)
Alkaline Phosphatase: 79 U/L (ref 38–126)
Anion gap: 13 (ref 5–15)
BUN: 8 mg/dL (ref 6–20)
CO2: 25 mmol/L (ref 22–32)
Calcium: 9.6 mg/dL (ref 8.9–10.3)
Chloride: 101 mmol/L (ref 98–111)
Creatinine, Ser: 0.82 mg/dL (ref 0.44–1.00)
GFR, Estimated: >60 mL/min (ref >60)
Glucose, Bld: 109 mg/dL — ABNORMAL HIGH (ref 70–99)
Potassium: 3.8 mmol/L (ref 3.5–5.1)
Sodium: 139 mmol/L (ref 135–145)
Total Bilirubin: 0.5 mg/dL (ref 0.3–1.2)
Total Protein: 7 g/dL (ref 6.5–8.1)

## 2020-03-22 LAB — URINALYSIS, ROUTINE W REFLEX MICROSCOPIC
Bilirubin Urine: NEGATIVE
Glucose, UA: NEGATIVE mg/dL
Hgb urine dipstick: NEGATIVE
Ketones, ur: NEGATIVE mg/dL
Leukocytes,Ua: NEGATIVE
Nitrite: NEGATIVE
Protein, ur: NEGATIVE mg/dL
Specific Gravity, Urine: 1.012 (ref 1.005–1.030)
pH: 6 (ref 5.0–8.0)

## 2020-03-22 LAB — LIPASE, BLOOD: Lipase: 28 U/L (ref 11–51)

## 2020-03-22 MED ORDER — IOHEXOL 300 MG/ML  SOLN
100.0000 mL | Freq: Once | INTRAMUSCULAR | Status: AC | PRN
  Administered 2020-03-22: 100 mL via INTRAVENOUS

## 2020-03-22 MED ORDER — ONDANSETRON HCL 4 MG/2ML IJ SOLN
4.0000 mg | Freq: Once | INTRAMUSCULAR | Status: AC
  Administered 2020-03-22: 4 mg via INTRAVENOUS
  Filled 2020-03-22: qty 2

## 2020-03-22 MED ORDER — KETOROLAC TROMETHAMINE 15 MG/ML IJ SOLN
15.0000 mg | Freq: Once | INTRAMUSCULAR | Status: AC
  Administered 2020-03-22: 15 mg via INTRAVENOUS
  Filled 2020-03-22: qty 1

## 2020-03-22 NOTE — ED Notes (Signed)
Iv team at bedside  

## 2020-03-22 NOTE — Discharge Instructions (Addendum)
You were seen in the ER for abdominal pain  Lab work, pelvic ultrasound, CT were all normal  The cause of your pain is unclear but at this time no emergent process  Alternate ibuprofen and acetaminophen as needed. Follow up with primary care doctor in 1 week if pain is no better  Return for fever, vomiting, diarrhea, problems urinating

## 2020-03-22 NOTE — ED Provider Notes (Signed)
[facility] EMERGENCY DEPARTMENT Provider Note   CSN: [id number] Arrival date & time: 03/22/20  0012     History Chief Complaint  Patient presents with  . Abdominal Pain    Linda Chambers is a 24 y.o. female.   24 year old female presents to the emergency department for evaluation of right lower quadrant abdominal pain.  States the pain has been intermittent x1 week, but has become more constant over the past 24 hours.  Pain is nonradiating.  Has taken Excedrin for symptoms without improvement.  Notes associated nausea without vomiting.  Bowel movements have been normal.  Denies urinary symptoms.  States the pain in her right lower quadrant feels similar to prior ovarian cysts, though these have often been on the left side.  Reports temp to 101F yesterday, but did receive COVID booster on Monday with some fatigue and myalgias.    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        Past Medical History:  Diagnosis Date  . Allergic rhinitis   . Anxiety   . Asthma    ?excercise only  . Bipolar 1 disorder (HCC)   . Hypercholesteremia   . OSA (obstructive sleep apnea) 02/02/2019  . Ovarian cyst     Patient Active Problem List   Diagnosis Date Noted  . OSA (obstructive sleep apnea) 02/02/2019  . Hx of ovarian cyst 05/21/2018  . 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 Hypertriglyceridemia 05/19/2018  . Asthma   . Bipolar 1 disorder (HCC)   . Hypercholesteremia   . Anxiety   . Allergic rhinitis     Past Surgical History:  Procedure Laterality Date  . ABDOMINAL SURGERY    . VAGINOPLASTY    . WISDOM TOOTH EXTRACTION Bilateral      OB History   No obstetric history on file.     Family History  Problem Relation Age of Onset  . Bipolar disorder Mother   . Hypertension Father   . Bipolar disorder Maternal Grandfather     Social History   Tobacco Use  . Smoking  status: Never Smoker  . Smokeless tobacco: Never Used  Substance Use Topics  . Alcohol use: Yes    Alcohol/week: 2.0 standard drinks    Types: 2 Glasses of wine per week    Comment: 2 drinks a month  . Drug use: Never    Home Medications Prior to Admission medications   Medication Sig Start Date End Date Taking? Authorizing Provider  busPIRone (BUSPAR) 10 MG tablet Take 10 mg by mouth 2 (two) times daily.    Yes [provider]  cetirizine (ZYRTEC) 10 MG tablet Take 1 tablet by mouth at bedtime.   Yes [provider]  Cyanocobalamin (VITAMIN B-12 PO) Take 1 tablet by mouth at bedtime.   Yes [provider]  icosapent Ethyl (VASCEPA) 1 g capsule Take 2 capsules (2 g total) by mouth 2 (two) times daily. 01/05/20  Yes Henson, [name] L, NP-C  levalbuterol (XOPENEX HFA) 45 MCG/ACT inhaler Inhale 1 puff into the lungs every 4 (four) hours as needed for wheezing. PRN when exercise   Yes [provider]  Lurasidone HCl 60 MG TABS Take 60 mg by mouth at bedtime.   Yes [provider]  Multiple Vitamin (MULTIVITAMIN) tablet Take 1 tablet by mouth at bedtime.   Yes [provider]  dicyclomine (BENTYL) 10 MG capsule Take 1-2 capsules (10-20 mg total) by  mouth every 4 (four) hours as needed for spasms. Patient not taking: Reported on 03/22/2020 02/06/20   Hilarie Fredrickson, MD  HYDROcodone-acetaminophen [facility]) 5-325 MG tablet Take 2 tablets by mouth every 6 (six) hours as needed for moderate pain. Patient not taking: No sig reported 02/11/20   Merwyn Katos, MD    Allergies    Patient has no known allergies.  Review of Systems   Review of Systems  Ten systems reviewed and are negative for acute change, except as noted in the HPI.    Physical Exam Updated Vital Signs BP 114/74   Pulse 98   Temp 98.5 F (36.9 C) (Oral)   Resp (!) 25   Ht 5\' 4"  (1.626 m)   Wt 108.4 kg   SpO2 98%   BMI 41.02 kg/m   Physical Exam Vitals and nursing  note reviewed.  Constitutional:      General: She is not in acute distress.    Appearance: She is well-developed and well-nourished. She is not diaphoretic.     Comments: Alert, pleasant. In NAD.  HENT:     Head: Normocephalic and atraumatic.  Eyes:     General: No scleral icterus.    Extraocular Movements: EOM normal.     Conjunctiva/sclera: Conjunctivae normal.  Pulmonary:     Effort: Pulmonary effort is normal. No respiratory distress.     Comments: Respirations even and unlabored Abdominal:    Musculoskeletal:        General: Normal range of motion.     Cervical back: Normal range of motion.  Skin:    General: Skin is warm and dry.     Coloration: Skin is not pale.     Findings: No erythema or rash.  Neurological:     Mental Status: She is alert and oriented to person, place, and time.  Psychiatric:        Mood and Affect: Mood and affect normal.        Behavior: Behavior normal.     ED Results / Procedures / Treatments   Labs (all labs ordered are listed, but only abnormal results are displayed) Labs Reviewed  COMPREHENSIVE METABOLIC PANEL - Abnormal; Notable for the following components:      Result Value   Glucose, Bld 109 (*)    All other components within normal limits  CBC - Abnormal; Notable for the following components:   RBC 5.32 (*)    MCV 77.4 (*)    MCH 25.6 (*)    Platelets 410 (*)    All other components within normal limits  LIPASE, BLOOD  URINALYSIS, ROUTINE W REFLEX MICROSCOPIC    EKG None  Radiology No results found.  Procedures Procedures (including critical care time)  Medications Ordered in ED Medications  ondansetron (ZOFRAN) injection 4 mg (has no administration in time range)  ketorolac (TORADOL) 15 MG/ML injection 15 mg (has no administration in time range)    ED Course  I have reviewed the triage vital signs and the nursing notes.  Pertinent labs & imaging results that were available during my care of the patient were  reviewed by me and considered in my medical decision making (see chart for details).  Clinical Course as of 03/22/20 0644  Thu Mar 22, 2020  0559 Patient presenting for right lower quadrant abdominal pain.  She does have focal tenderness in the right lower quadrant.  Appendicitis felt less likely given symptom chronicity as well as progression.  She has no fever, leukocytosis.  Known history of ovarian cysts and states that this feels similar.  Will obtain pelvic ultrasound. 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 Has ovaries. H/o cysts, rupture 1 week of RLQ abdominal pain, feels like previous  Pending PUS If benign, discuss CT for appendicitis  No concern for GU process, stone [CG]    Clinical Course User Index [CG] Liberty Handy, PA-C [KH] Antony Madura, PA-C   MDM Rules/Calculators/A&P                          Patient with hx of MRKH Syndrome presenting for 1 week RLQ pain similar to past episodes of ovarian cyst. Reports recently being diagnosed with PCOS as well. No cysts seen on R ovary in October 2021. Labs reassuring. Plan for [place] for further evaluation. Medications ordered for symptom control. Care signed out to [place], PA-C at shift change.   Final Clinical Impression(s) / ED Diagnoses Final diagnoses:  Right lower quadrant abdominal pain    Rx / DC Orders ED Discharge Orders    None       Antony Madura, PA-C 03/22/20 3976    Alvira Monday, MD 03/22/20 2216

## 2020-03-22 NOTE — ED Provider Notes (Signed)
  Physical Exam  BP 120/82   Pulse 96   Temp 98.5 F (36.9 C) (Oral)   Resp (!) 24   Ht 5\' 4"  (1.626 m)   Wt 108.4 kg   SpO2 98%   BMI 41.02 kg/m   Physical Exam Vitals and nursing note reviewed.  Constitutional:      Appearance: She is well-developed.     Comments: Non toxic in NAD  HENT:     Head: Normocephalic and atraumatic.     Nose: Nose normal.  Eyes:     Conjunctiva/sclera: Conjunctivae normal.  Cardiovascular:     Rate and Rhythm: Normal rate.  Pulmonary:     Effort: Pulmonary effort is normal.  Abdominal:     General: Bowel sounds are normal.     Palpations: Abdomen is soft.     Tenderness: There is abdominal tenderness in the right lower quadrant. Positive signs include McBurney's sign (with deep palpation).     Comments: Obese abdomen, soft. No suprapubic or CVA tenderness. Negative Murphy's. Active BS to lower quadrants.   Musculoskeletal:        General: Normal range of motion.     Cervical back: Normal range of motion.  Skin:    General: Skin is warm and dry.     Capillary Refill: Capillary refill takes less than 2 seconds.  Neurological:     Mental Status: She is alert.  Psychiatric:        Behavior: Behavior normal.     ED Course/Procedures   Clinical Course as of 03/22/20 1110  Thu Mar 22, 2020  0559 Patient presenting for right lower quadrant abdominal pain.  She does have focal tenderness in the right lower quadrant.  Appendicitis felt less likely given symptom chronicity as well as progression.  She has no fever, leukocytosis.  Known history of ovarian cysts and states that this feels similar.  Will obtain pelvic ultrasound. 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 Has ovaries. H/o cysts, rupture 1 week of RLQ abdominal pain, feels like previous  Pending PUS If benign, discuss CT for appendicitis  No concern for GU process, stone [CG]  [id number] PELVIC COMPLETE W TRANSVAGINAL AND TORSION R/O IMPRESSION: 1. Negative right ovary. 2.  Nonvisualized left ovary.   [CG]  1109 CT ABDOMEN PELVIS W CONTRAST Fatty infiltration of liver.  Post hysterectomy.  No acute intra-abdominal or intrapelvic abnormalities.   [CG]    Clinical Course User Index [CG] [place], PA-C [KH] Liberty Handy, PA-C    Procedures  MDM   0730: TVUS unremarkable. Re-evaluated patient, discussed results. TTP right lower but more lateral/low than McBurney's point. Obese abdomen, difficult exam. She reports fever 101 yesterday, worsening more localized abdominal pain last 24 hours. Did have COVID booster on Monday. Discussed possibility fever related to COVID vaccine and RLQ unrelated. No urinary symptoms, no kidney stone history. Will obtain CTAP to evaluate for appendicitis, kidney stone. Doubt right sided diverticulitis. Patient states she has been having a lot of bowel movement but no diarrhea.  1110: CTAP negative for acute process. Pain improved. Discussed results with patient. Appropriate for discharge with NSAIDs, PCP follow up for persistent pain. Return precautions given.     [place], PA-C 03/22/20 1110    14/09/21, MD 03/22/20 1550

## 2020-03-22 NOTE — ED Triage Notes (Signed)
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

## 2020-03-30 ENCOUNTER — Encounter: Payer: Self-pay | Admitting: Obstetrics and Gynecology

## 2020-03-30 ENCOUNTER — Other Ambulatory Visit: Payer: Self-pay

## 2020-03-30 ENCOUNTER — Ambulatory Visit (INDEPENDENT_AMBULATORY_CARE_PROVIDER_SITE_OTHER): Payer: BLUE CROSS/BLUE SHIELD | Admitting: Obstetrics and Gynecology

## 2020-03-30 VITALS — BP 116/74 | HR 84 | Ht 64.0 in | Wt 238.0 lb

## 2020-03-30 DIAGNOSIS — R109 Unspecified abdominal pain: Secondary | ICD-10-CM | POA: Diagnosis not present

## 2020-03-30 DIAGNOSIS — Z3009 Encounter for other general counseling and advice on contraception: Secondary | ICD-10-CM

## 2020-03-30 DIAGNOSIS — N760 Acute vaginitis: Secondary | ICD-10-CM

## 2020-03-30 DIAGNOSIS — Z113 Encounter for screening for infections with a predominantly sexual mode of transmission: Secondary | ICD-10-CM | POA: Diagnosis not present

## 2020-03-30 DIAGNOSIS — E282 Polycystic ovarian syndrome: Secondary | ICD-10-CM

## 2020-03-30 DIAGNOSIS — R1032 Left lower quadrant pain: Secondary | ICD-10-CM

## 2020-03-30 MED ORDER — NORETHIN ACE-ETH ESTRAD-FE 1-20 MG-MCG PO TABS: 1.0000 | ORAL_TABLET | Freq: Every day | ORAL | 11 refills | Status: DC

## 2020-03-30 NOTE — Patient Instructions (Addendum)
Hormonal Contraception Information Hormonal contraception is a type of birth control that uses hormones to prevent pregnancy. It usually involves a combination of the hormones estrogen and progesterone or only the hormone progesterone. Hormonal contraception works in these ways:  It thickens the mucus in the cervix, making it harder for sperm to enter the uterus.  It changes the lining of the uterus, making it harder for an egg to implant.  It may stop the ovaries from releasing eggs (ovulation). Some women who take hormonal contraceptives that contain only progesterone may continue to ovulate. Hormonal contraception cannot prevent sexually transmitted infections (STIs). Pregnancy may still occur. Estrogen and progesterone contraceptives Contraceptives that use a combination of estrogen and progesterone are available in these forms:  Pill. Pills come in different combinations of hormones. They must be taken at the same time each day. Pills can affect your period, causing you to get your period once every three months or not at all.  Patch. The patch must be worn on the lower abdomen for three weeks and then removed on the fourth.  Vaginal ring. The ring is placed in the vagina and left there for three weeks. It is then removed for one week. Progesterone contraceptives Contraceptives that use progesterone only are available in these forms:  Pill. Pills should be taken every day of the cycle.  Intrauterine device (IUD). This device is inserted into the uterus and removed or replaced every five years or sooner.  Implant. Plastic rods are placed under the skin of the upper arm. They are removed or replaced every three years or sooner.  Injection. The injection is given once every 90 days. What are the side effects? The side effects of estrogen and progesterone contraceptives include:  Nausea.  Headaches.  Breast tenderness.  Bleeding or spotting between menstrual cycles.  High blood  pressure (rare).  Strokes, heart attacks, or blood clots (rare) Side effects of progesterone-only contraceptives include:  Nausea.  Headaches.  Breast tenderness.  Unpredictable menstrual bleeding.  High blood pressure (rare). Talk to your health care provider about what side effects may affect you. Where to find more information  Ask your health care provider for more information and resources about hormonal contraception.  [place] Department of Health and Cytogeneticist on Women's Health: http://hoffman.com/ Questions to ask:  What type of hormonal contraception is right for me?  How long should I plan to use hormonal contraception?  What are the side effects of the hormonal contraception method I choose?  How can I prevent STIs while using hormonal contraception? Contact a health care provider if:  You start taking hormonal contraceptives and you develop persistent or severe side effects. Summary  Estrogen and progesterone are hormones used in many forms of birth control.  Talk to your health care provider about what side effects may affect you.  Hormonal contraception cannot prevent sexually transmitted infections (STIs).  Ask your health care provider for more information and resources about hormonal contraception. This information is not intended to replace advice given to you by your health care provider. Make sure you discuss any questions you have with your health care provider. Document Revised: 07/26/2018 Document Reviewed: 02/29/2016 Elsevier Patient Education  2020 Else[employer] Inc.    Abdominal Pain, Adult Pain in the abdomen (abdominal pain) can be caused by many things. Often, abdominal pain is not serious and it gets better with no treatment or by being treated at home. However, sometimes abdominal pain is serious. Your health care provider will ask  questions about your medical history and do a physical exam to try to determine the cause of your  abdominal pain. Follow these instructions at home:  Medicines  Take over-the-counter and prescription medicines only as told by your health care provider.  Do not take a laxative unless told by your health care provider. General instructions  Watch your condition for any changes.  Drink enough fluid to keep your urine pale yellow.  Keep all follow-up visits as told by your health care provider. This is important. Contact a health care provider if:  Your abdominal pain changes or gets worse.  You are not hungry or you lose weight without trying.  You are constipated or have diarrhea for more than 2-3 days.  You have pain when you urinate or have a bowel movement.  Your abdominal pain wakes you up at night.  Your pain gets worse with meals, after eating, or with certain foods.  You are vomiting and cannot keep anything down.  You have a fever.  You have blood in your urine. Get help right away if:  Fatty Liver Disease  Fatty liver disease occurs when too much fat has built up in your liver cells. Fatty liver disease is also called hepatic steatosis or steatohepatitis. The liver removes harmful substances from your bloodstream and produces fluids that your body needs. It also helps your body use and store energy from the food you eat. In many cases, fatty liver disease does not cause symptoms or problems. It is often diagnosed when tests are being done for other reasons. However, over time, fatty liver can cause inflammation that may lead to more serious liver problems, such as scarring of the liver (cirrhosis) and liver failure. Fatty liver is associated with insulin resistance, increased body fat, high blood pressure (hypertension), and high cholesterol. These are features of metabolic syndrome and increase your risk for stroke, diabetes, and heart disease. What are the causes? This condition may be caused by: Drinking too much alcohol. Poor nutrition. Obesity. Cushing's  syndrome. Diabetes. High cholesterol. Certain drugs. Poisons. Some viral infections. Pregnancy. What increases the risk? You are more likely to develop this condition if you: Abuse alcohol. Are overweight. Have diabetes. Have hepatitis. Have a high triglyceride level. Are pregnant. What are the signs or symptoms? Fatty liver disease often does not cause symptoms. If symptoms do develop, they can include: Fatigue. Weakness. Weight loss. Confusion. Abdominal pain. Nausea and vomiting. Yellowing of your skin and the white parts of your eyes (jaundice). Itchy skin. How is this diagnosed? This condition may be diagnosed by: A physical exam and medical history. Blood tests. Imaging tests, such as an ultrasound, CT scan, or MRI. A liver biopsy. A small sample of liver tissue is removed using a needle. The sample is then looked at under a microscope. How is this treated? Fatty liver disease is often caused by other health conditions. Treatment for fatty liver may involve medicines and lifestyle changes to manage conditions such as: Alcoholism. High cholesterol. Diabetes. Being overweight or obese. Follow these instructions at home:  Do not drink alcohol. If you have trouble quitting, ask your health care provider how to safely quit with the help of medicine or a supervised program. This is important to keep your condition from getting worse. Eat a healthy diet as told by your health care provider. Ask your health care provider about working with a diet and nutrition specialist (dietitian) to develop an eating plan. Exercise regularly. This can help you lose  weight and control your cholesterol and diabetes. Talk to your health care provider about an exercise plan and which activities are best for you. Take over-the-counter and prescription medicines only as told by your health care provider. Keep all follow-up visits as told by your health care provider. This is important. Contact  a health care provider if: You have trouble controlling your: Blood sugar. This is especially important if you have diabetes. Cholesterol. Drinking of alcohol. Get help right away if: You have abdominal pain. You have jaundice. You have nausea and vomiting. You vomit blood or material that looks like coffee grounds. You have stools that are black, tar-like, or bloody. Summary Fatty liver disease develops when too much fat builds up in the cells of your liver. Fatty liver disease often causes no symptoms or problems. However, over time, fatty liver can cause inflammation that may lead to more serious liver problems, such as scarring of the liver (cirrhosis). You are more likely to develop this condition if you abuse alcohol, are pregnant, are overweight, have diabetes, have hepatitis, or have high triglyceride levels. Contact your health care provider if you have trouble controlling your weight, blood sugar, cholesterol, or drinking of alcohol. This information is not intended to replace advice given to you by your health care provider. Make sure you discuss any questions you have with your health care provider. Document Revised: 03/13/2017 Document Reviewed: 01/07/2017 Elsevier Patient Education  2020 [employer].  Your pain does not go away as soon as your health care provider told you to expect.  You cannot stop vomiting.  Your pain is only in areas of the abdomen, such as the right side or the left lower portion of the abdomen. Pain on the right side could be caused by appendicitis.  You have bloody or black stools, or stools that look like tar.  You have severe pain, cramping, or bloating in your abdomen.  You have signs of dehydration, such as: ? Dark urine, very little urine, or no urine. ? Cracked lips. ? Dry mouth. ? Sunken eyes. ? Sleepiness. ? Weakness.  You have trouble breathing or chest pain. Summary  Often, abdominal pain is not serious and it gets better with  no treatment or by being treated at home. However, sometimes abdominal pain is serious.  Watch your condition for any changes.  Take over-the-counter and prescription medicines only as told by your health care provider.  Contact a health care provider if your abdominal pain changes or gets worse.  Get help right away if you have severe pain, cramping, or bloating in your abdomen. This information is not intended to replace advice given to you by your health care provider. Make sure you discuss any questions you have with your health care provider. Document Revised: 08/09/2018 Document Reviewed: 08/09/2018 Elsevier Patient Education  2020 [employer].

## 2020-03-30 NOTE — Progress Notes (Signed)
Patient ID: Linda Chambers, female   DOB: 19-Jul-1995, 23 y.o.   MRN: [id number]  Reason for Consult: Follow-up (ER follow symptoms of ovarian cyst, but u/s shows none per patient)   Referred by Avanell Shackleton, NP-C  Subjective:     HPI:  Linda Chambers is a 24 y.o. female.  Patient presents today for consultation regarding abdominal pain.  She reports that this pain started in August.  She feels the pain on both sides of her pelvis.  She reports that she was seen at an emergency room and was told that she had fluid in her stomach suggestive of a ruptured ovarian cyst and the pain is attributed to this.  She reports that sometimes the pain hurts towards her bladder extending from the sides to the middle.  She reports that the pain is sharp and comes and goes.  She reports that her belly feels bloated and uncomfortably full.  She reports that the pain is worse after eating.  She reports that heating pad sometimes helps.  She reports that Advil Aleve and other narcotic medicines have not helped with the pain.  She reports that the pain occurs all day throughout the day.  She reports that the pain makes it difficult and uncomfortable to move.  She reports she was seen by GI doctor and told to lose weight.  She had testing for celiac's which was negative.  She was advised to have allergy diet modifications including a low fiber diet.  She has made these modifications and nothing has improved.   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 She underwent a vaginoplasty surgery and is sexually active.   Past Medical History:  Diagnosis Date  . Allergic rhinitis   . Anxiety   . Asthma    ?excercise only  . Bipolar 1 disorder (HCC)   . Hypercholesteremia   . OSA (obstructive sleep apnea) 02/02/2019  . Ovarian cyst    Family History  Problem Relation Age of Onset  . Bipolar disorder Mother   . Hypertension Father   . Bipolar disorder Maternal Grandfather    Past Surgical History:   Procedure Laterality Date  . ABDOMINAL SURGERY    . VAGINOPLASTY    . WISDOM TOOTH EXTRACTION Bilateral     Short Social History:  Social History   Tobacco Use  . Smoking status: Never Smoker  . Smokeless tobacco: Never Used  Substance Use Topics  . Alcohol use: Yes    Alcohol/week: 2.0 standard drinks    Types: 2 Glasses of wine per week    Comment: 2 drinks a month    No Known Allergies  Current Outpatient Medications  Medication Sig Dispense Refill  . busPIRone (BUSPAR) 10 MG tablet Take 10 mg by mouth 2 (two) times daily.     . cetirizine (ZYRTEC) 10 MG tablet Take 1 tablet by mouth at bedtime.    . Cyanocobalamin (VITAMIN B-12 PO) Take 1 tablet by mouth at bedtime.    Marland Kitchen icosapent Ethyl (VASCEPA) 1 g capsule Take 2 capsules (2 g total) by mouth 2 (two) times daily. 120 capsule 5  . levalbuterol (XOPENEX HFA) 45 MCG/ACT inhaler Inhale 1 puff into the lungs every 4 (four) hours as needed for wheezing. PRN when exercise    . Lurasidone HCl 60 MG TABS Take 60 mg by mouth at bedtime.    . Multiple Vitamin (MULTIVITAMIN) tablet Take 1 tablet by mouth at bedtime.    . dicyclomine (BENTYL) 10  MG capsule Take 1-2 capsules (10-20 mg total) by mouth every 4 (four) hours as needed for spasms. (Patient not taking: Reported on 03/30/2020) 60 capsule 1  . HYDROcodone-acetaminophen (NORCO) 5-325 MG tablet Take 2 tablets by mouth every 6 (six) hours as needed for moderate pain. (Patient not taking: No sig reported) 12 tablet 0  . norethindrone-ethinyl estradiol (JUNEL FE 1/20) 1-20 MG-MCG tablet Take 1 tablet by mouth daily. 28 tablet 11   No current facility-administered medications for this visit.    Review of Systems  Constitutional: Negative for chills, fatigue, fever and unexpected weight change.  HENT: Negative for trouble swallowing.  Eyes: Negative for loss of vision.  Respiratory: Negative for cough, shortness of breath and wheezing.  Cardiovascular: Negative for chest pain,  leg swelling, palpitations and syncope.  GI: Negative for abdominal pain, blood in stool, diarrhea, nausea and vomiting.  GU: Negative for difficulty urinating, dysuria, frequency and hematuria.  Musculoskeletal: Negative for back pain, leg pain and joint pain.  Skin: Negative for rash.  Neurological: Negative for dizziness, headaches, light-headedness, numbness and seizures.  Psychiatric: Negative for behavioral problem, confusion, depressed mood and sleep disturbance.        Objective:  Objective   Vitals:   03/30/20 1625  BP: 116/74  Pulse: 84  Weight: 238 lb (108 kg)  Height: 5\' 4"  (1.626 m)   Body mass index is 40.85 kg/m.  Physical Exam Vitals and nursing note reviewed.  Constitutional:      Appearance: She is well-developed and well-nourished.  HENT:     Head: Normocephalic and atraumatic.  Eyes:     Extraocular Movements: EOM normal.     Pupils: Pupils are equal, round, and reactive to light.  Cardiovascular:     Rate and Rhythm: Normal rate and regular rhythm.  Pulmonary:     Effort: Pulmonary effort is normal. No respiratory distress.  Genitourinary:    Comments: Blunted vagina, unable to perform bimanual exam.  Skin:    General: Skin is warm and dry.  Neurological:     Mental Status: She is alert and oriented to person, place, and time.  Psychiatric:        Mood and Affect: Mood and affect normal.        Behavior: Behavior normal.        Thought Content: Thought content normal.        Judgment: Judgment normal.     Assessment/Plan:     24 yo with abdominal pain  General abdominal pain for 2 months. Patient had normal imaging of her ovaries earlier this month by CT and 21.  Concern regarding possible PCOS.  Will start OCP.  Encouraged patient to start a pain diary.  GI referral made.   More than 30 minutes were spent face to face with the patient in the room, reviewing the medical record, labs and images, and coordinating care for the patient. The  plan of management was discussed in detail and counseling was provided.    [place] MD, Adelene Idler [facility], [facility] 03/30/2020 5:14 PM

## 2020-04-05 LAB — NUSWAB VAGINITIS PLUS (VG+)
BVAB 2: HIGH Score — AB
Candida albicans, NAA: POSITIVE — AB
Candida glabrata, NAA: NEGATIVE
Chlamydia trachomatis, NAA: NEGATIVE
Neisseria gonorrhoeae, NAA: NEGATIVE
Trich vag by NAA: NEGATIVE

## 2020-04-17 ENCOUNTER — Telehealth: Payer: Self-pay | Admitting: Obstetrics & Gynecology

## 2020-04-17 NOTE — Telephone Encounter (Signed)
Pt states referall was placed on her behalf to [facility]. Pt hasn't been able to contact anyone at the clinic, she would like for you to reach back on her behalf.

## 2020-04-27 ENCOUNTER — Other Ambulatory Visit: Payer: Self-pay | Admitting: Obstetrics & Gynecology

## 2020-05-17 ENCOUNTER — Encounter: Payer: Self-pay | Admitting: Gastroenterology

## 2020-05-17 ENCOUNTER — Other Ambulatory Visit: Payer: Self-pay

## 2020-05-17 ENCOUNTER — Ambulatory Visit (INDEPENDENT_AMBULATORY_CARE_PROVIDER_SITE_OTHER): Payer: BLUE CROSS/BLUE SHIELD | Admitting: Gastroenterology

## 2020-05-17 VITALS — BP 135/92 | HR 102 | Temp 98.8°F | Ht 64.0 in | Wt 244.1 lb

## 2020-05-17 DIAGNOSIS — K580 Irritable bowel syndrome with diarrhea: Secondary | ICD-10-CM

## 2020-05-17 DIAGNOSIS — R197 Diarrhea, unspecified: Secondary | ICD-10-CM | POA: Diagnosis not present

## 2020-05-17 NOTE — Patient Instructions (Signed)
Fatty Liver Disease  The liver converts food into energy, removes toxic material from the blood, makes important proteins, and absorbs necessary vitamins from food. Fatty liver disease occurs when too much fat has built up in your liver cells. Fatty liver disease is also called hepatic steatosis. In many cases, fatty liver disease does not cause symptoms or problems. It is often diagnosed when tests are being done for other reasons. However, over time, fatty liver can cause inflammation that may lead to more serious liver problems, such as scarring of the liver (cirrhosis) and liver failure. Fatty liver is associated with insulin resistance, increased body fat, high blood pressure (hypertension), and high cholesterol. These are features of metabolic syndrome and increase your risk for stroke, diabetes, and heart disease. What are the causes? This condition may be caused by components of metabolic syndrome:  Obesity.  Insulin resistance.  High cholesterol. Other causes:  Alcohol abuse.  Poor nutrition.  Cushing syndrome.  Pregnancy.  Certain drugs.  Poisons.  Some viral infections. What increases the risk? You are more likely to develop this condition if you:  Abuse alcohol.  Are overweight.  Have diabetes.  Have hepatitis.  Have a high triglyceride level.  Are pregnant. What are the signs or symptoms? Fatty liver disease often does not cause symptoms. If symptoms do develop, they can include:  Fatigue and weakness.  Weight loss.  Confusion.  Nausea, vomiting, or abdominal pain.  Yellowing of your skin and the white parts of your eyes (jaundice).  Itchy skin. How is this diagnosed? This condition may be diagnosed by:  A physical exam and your medical history.  Blood tests.  Imaging tests, such as an ultrasound, CT scan, or MRI.  A liver biopsy. A small sample of liver tissue is removed using a needle. The sample is then looked at under a  microscope. How is this treated? Fatty liver disease is often caused by other health conditions. Treatment for fatty liver may involve medicines and lifestyle changes to manage conditions such as:  Alcoholism.  High cholesterol.  Diabetes.  Being overweight or obese. Follow these instructions at home:  Do not drink alcohol. If you have trouble quitting, ask your health care provider how to safely quit with the help of medicine or a supervised program. This is important to keep your condition from getting worse.  Eat a healthy diet as told by your health care provider. Ask your health care provider about working with a dietitian to develop an eating plan.  Exercise regularly. This can help you lose weight and control your cholesterol and diabetes. Talk to your health care provider about an exercise plan and which activities are best for you.  Take over-the-counter and prescription medicines only as told by your health care provider.  Keep all follow-up visits. This is important.   Contact a health care provider if:  You have trouble controlling your: ? Blood sugar. This is especially important if you have diabetes. ? Cholesterol. ? Drinking of alcohol. Get help right away if:  You have abdominal pain.  You have jaundice.  You have nausea and are vomiting.  You vomit blood or material that looks like coffee grounds.  You have stools that are black, tar-like, or bloody. Summary  Fatty liver disease develops when too much fat builds up in the cells of your liver.  Fatty liver disease often causes no symptoms or problems. However, over time, fatty liver can cause inflammation that may lead to more serious liver   problems, such as scarring of the liver (cirrhosis).  You are more likely to develop this condition if you abuse alcohol, are pregnant, are overweight, have diabetes, have hepatitis, or have high triglyceride or cholesterol levels.  Contact your health care provider  if you have trouble controlling your blood sugar, cholesterol, or drinking of alcohol. This information is not intended to replace advice given to you by your health care provider. Make sure you discuss any questions you have with your health care provider. Document Revised: 01/12/2020 Document Reviewed: 01/12/2020 Elsevier Patient Education  2021 Elsevier Inc.  

## 2020-05-17 NOTE — Progress Notes (Addendum)
Arlyss Repress, MD [address]  [address]  [place], [place] [address]  Main: [phone]  Fax: [phone]    Gastroenterology Consultation  Referring Provider:     Natale Milch, * Primary Care Physician:  Avanell Shackleton, NP-C Primary Gastroenterologist:  Dr. Arlyss Repress Reason for Consultation:     Abdominal pain, loose stools, fatty liver        HPI:   Linda Chambers is a 25 y.o. female referred by Dr. Suezanne Jacquet, Zorita Pang, NP-C  for consultation & management of abdominal pain, loose stools and fatty liver.  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  Patient takes Bentyl as needed for abdominal cramps, which provides some relief. Occasionally, she has hard bowel movements.  When her pain is severe, she does report feeling dizzy as well as nausea.  She has history of PCOS, currently on oral contraceptive pills.  She underwent CT abdomen and pelvis which revealed fatty liver and since this diagnosis, patient is trying to incorporate more exercise as well as trying to eat healthy.  She has history of eating disorder, planning to seek consultation with a nutritionist. She does drink diet soda Labs revealed normal CMP, normal hemoglobin, MCV 77.4 in 12/21, TSH normal  She does not smoke tobacco or drink alcohol.  She does take CBD when her GI symptoms are severe  NSAIDs: None  Antiplts/Anticoagulants/Anti thrombotics: None  GI Procedures: None  Past Medical History:  Diagnosis Date  . Allergic rhinitis   . Anxiety   . Asthma    ?excercise only  . Bipolar 1 disorder (HCC)   . Hypercholesteremia   . OSA (obstructive sleep apnea) 02/02/2019  . Ovarian cyst     Past Surgical History:  Procedure Laterality Date  . ABDOMINAL SURGERY    . VAGINOPLASTY    .  WISDOM TOOTH EXTRACTION Bilateral     Current Outpatient Medications:  .  busPIRone (BUSPAR) 10 MG tablet, Take 10 mg by mouth 2 (two) times daily. , Disp: , Rfl:  .  cetirizine (ZYRTEC) 10 MG tablet, Take 1 tablet by mouth at bedtime., Disp: , Rfl:  .  Cyanocobalamin (VITAMIN B-12 PO), Take 1 tablet by mouth at bedtime., Disp: , Rfl:  .  dicyclomine (BENTYL) 10 MG capsule, Take 1-2 capsules (10-20 mg total) by mouth every 4 (four) hours as needed for spasms., Disp: 60 capsule, Rfl: 1 .  HYDROcodone-acetaminophen (NORCO) 5-325 MG tablet, Take 2 tablets by mouth every 6 (six) hours as needed for moderate pain., Disp: 12 tablet, Rfl: 0 .  icosapent Ethyl (VASCEPA) 1 g capsule, Take 2 capsules (2 g total) by mouth 2 (two) times daily., Disp: 120 capsule, Rfl: 5 .  levalbuterol (XOPENEX HFA) 45 MCG/ACT inhaler, Inhale 1 puff into the lungs every 4 (four) hours as needed for wheezing. PRN when exercise, Disp: , Rfl:  .  Lurasidone HCl 60 MG TABS, Take 60 mg by mouth at bedtime., Disp: , Rfl:  .  Multiple Vitamin (MULTIVITAMIN) tablet, Take 1 tablet by mouth at bedtime., Disp: , Rfl:  .  norethindrone-ethinyl estradiol (JUNEL FE 1/20) 1-20 MG-MCG tablet, Take 1 tablet by mouth daily., Disp: 28 tablet, Rfl: 11   Family History  Problem Relation Age of Onset  . Bipolar disorder Mother   . Hypertension  Father   . Bipolar disorder Maternal Grandfather      Social History   Tobacco Use  . Smoking status: Never Smoker  . Smokeless tobacco: Never Used  Substance Use Topics  . Alcohol use: Yes    Alcohol/week: 2.0 standard drinks    Types: 2 Glasses of wine per week    Comment: 2 drinks a month  . Drug use: Never    Allergies as of 05/17/2020  . (No Known Allergies)    Review of Systems:    All systems reviewed and negative except where noted in HPI.   Physical Exam:  BP (!) 135/92 (BP Location: Left Arm, Patient Position: Sitting, Cuff Size: Normal)   Pulse (!) 102   Temp 98.8 F  (37.1 C) (Oral)   Ht 5\' 4"  (1.626 m)   Wt 244 lb 2 oz (110.7 kg)   BMI 41.90 kg/m  No LMP recorded. 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  General:   Alert,  Well-developed, well-nourished, pleasant and cooperative in NAD Head:  Normocephalic and atraumatic. Eyes:  Sclera clear, no icterus.   Conjunctiva pink. Ears:  Normal auditory acuity. Nose:  No deformity, discharge, or lesions. Mouth:  No deformity or lesions,oropharynx pink & moist. Neck:  Supple; no masses or thyromegaly. Lungs:  Respirations even and unlabored.  Clear throughout to auscultation.   No wheezes, crackles, or rhonchi. No acute distress. Heart:  Regular rate and rhythm; no murmurs, clicks, rubs, or gallops. Abdomen:  Normal bowel sounds. Soft, non-tender and non-distended without masses, hepatosplenomegaly or hernias noted.  No guarding or rebound tenderness.   Rectal: Not performed Msk:  Symmetrical without gross deformities. Good, equal movement & strength bilaterally. Pulses:  Normal pulses noted. Extremities:  No clubbing or edema.  No cyanosis. Neurologic:  Alert and oriented x3;  grossly normal neurologically. Skin:  Intact without significant lesions or rashes. No jaundice. Psych:  Alert and cooperative. Normal mood and affect.  Imaging Studies: Reviewed  Assessment and Plan:   Linda Chambers is a 25 y.o. female with history of bipolar, eating disorder, obesity, BMI 41.9, PCOS, fatty liver is seen in consultation for more than 6 months history of generalized abdominal cramps associated with abdominal bloating, increased bowel frequency with no other constitutional symptoms.  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 pylori breath test as well as GI profile PCR Trial of IBgard as well as probiotics, samples provided Discussed with patient regarding tricyclic antidepressant, amitriptyline.  She will discuss with her psychiatrist regarding the switch  Fatty  liver Most recent LFTs are normal Encouraged her to continue regular exercise and work on healthy eating habits Check LFTs annually   Follow up in 3 months   21, MD

## 2020-06-04 ENCOUNTER — Other Ambulatory Visit: Payer: Self-pay

## 2020-06-04 ENCOUNTER — Ambulatory Visit (INDEPENDENT_AMBULATORY_CARE_PROVIDER_SITE_OTHER): Payer: BLUE CROSS/BLUE SHIELD | Admitting: Family Medicine

## 2020-06-04 ENCOUNTER — Encounter: Payer: Self-pay | Admitting: Family Medicine

## 2020-06-04 VITALS — BP 136/88 | HR 114 | Temp 98.7°F | Wt 245.2 lb

## 2020-06-04 DIAGNOSIS — S29012A Strain of muscle and tendon of back wall of thorax, initial encounter: Secondary | ICD-10-CM

## 2020-06-04 NOTE — Patient Instructions (Signed)
Take 2 Naprosyn twice per day and you can also take up to 2 Tylenol 4 times per day.  Heat to your back for 20 minutes 3 times per day with gentle stretching.  Proper posturing.. Sit like your mom taught You
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
## 2020-07-20 ENCOUNTER — Encounter: Payer: Self-pay | Admitting: Internal Medicine

## 2020-08-09 LAB — H. PYLORI BREATH TEST: H pylori Breath Test: NEGATIVE

## 2020-08-12 LAB — GI PROFILE, STOOL, PCR

## 2020-08-13 ENCOUNTER — Encounter: Payer: Self-pay | Admitting: Gastroenterology

## 2020-08-14 ENCOUNTER — Other Ambulatory Visit: Payer: Self-pay

## 2020-08-14 ENCOUNTER — Encounter: Payer: Self-pay | Admitting: Gastroenterology

## 2020-08-14 ENCOUNTER — Ambulatory Visit (INDEPENDENT_AMBULATORY_CARE_PROVIDER_SITE_OTHER): Payer: BLUE CROSS/BLUE SHIELD | Admitting: Gastroenterology

## 2020-08-14 VITALS — BP 119/81 | HR 98 | Temp 98.7°F | Ht 64.0 in | Wt 247.5 lb

## 2020-08-14 DIAGNOSIS — K580 Irritable bowel syndrome with diarrhea: Secondary | ICD-10-CM | POA: Diagnosis not present

## 2020-08-14 NOTE — Progress Notes (Signed)
Arlyss Repress, MD [address]  [address]  [place], [place] [address]  Main: [phone]  Fax: [phone]    Gastroenterology Consultation  Referring Provider:     Avanell Shackleton, NP-C Primary Care Physician:  Avanell Shackleton, NP-C Primary Gastroenterologist:  Dr. Arlyss Repress Reason for Consultation:     Abdominal pain, loose stools, fatty liver        HPI:   Linda Chambers is a 25 y.o. female referred by Dr. Suezanne Jacquet, Zorita Pang, NP-C  for consultation & management of abdominal pain, loose stools and fatty liver.  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  Patient takes Bentyl as needed for abdominal cramps, which provides some relief. Occasionally, she has hard bowel movements.  When her pain is severe, she does report feeling dizzy as well as nausea.  She has history of PCOS, currently on oral contraceptive pills.  She underwent CT abdomen and pelvis which revealed fatty liver and since this diagnosis, patient is trying to incorporate more exercise as well as trying to eat healthy.  She has history of eating disorder, planning to seek consultation with a nutritionist. She does drink diet soda Labs revealed normal CMP, normal hemoglobin, MCV 77.4 in 12/21, TSH normal  She does not smoke tobacco or drink alcohol.  She does take CBD when her GI symptoms are severe  Follow-up visit 08/14/2020 Patient underwent H. pylori breath test which was negative as well as stool studies are negative for infection.  Her symptoms are highly consistent with diarrhea predominant IBS.  Patient psychiatrist has switched her to Lexapro.  Patient noticed that the frequency of her GI symptoms have reduced to 2-3 times a week instead of daily.  She also felt FD guard control her symptoms compared to  IBgard.  She noticed that since she started Lexapro, although her appetite has decreased, she is gaining weight.  She started walking regularly.  Patient did not like Bentyl  NSAIDs: None  Antiplts/Anticoagulants/Anti thrombotics: None  GI Procedures: None  Past Medical History:  Diagnosis Date  . Allergic rhinitis   . Anxiety   . Asthma    ?excercise only  . Bipolar 1 disorder (HCC)   . Hypercholesteremia   . OSA (obstructive sleep apnea) 02/02/2019  . Ovarian cyst     Past Surgical History:  Procedure Laterality Date  . ABDOMINAL SURGERY    . VAGINOPLASTY    . WISDOM TOOTH EXTRACTION Bilateral     Current Outpatient Medications:  .  cetirizine (ZYRTEC) 10 MG tablet, Take 1 tablet by mouth at bedtime., Disp: , Rfl:  .  Cyanocobalamin (VITAMIN B-12 PO), Take 1 tablet by mouth at bedtime., Disp: , Rfl:  .  escitalopram (LEXAPRO) 10 MG tablet, Take 10 mg by mouth daily., Disp: , Rfl:  .  icosapent Ethyl (VASCEPA) 1 g capsule, Take 2 capsules (2 g total) by mouth 2 (two) times daily., Disp: 120 capsule, Rfl: 5 .  levalbuterol (XOPENEX HFA) 45 MCG/ACT inhaler, Inhale 1 puff into the lungs every 4 (four) hours as needed for wheezing. PRN when exercise, Disp: , Rfl:  .  Lurasidone HCl 60 MG TABS, Take 60 mg by mouth at bedtime., Disp: , Rfl:  .  Multiple Vitamin (MULTIVITAMIN) tablet, Take 1 tablet by mouth at bedtime., Disp: ,  Rfl:  .  norethindrone-ethinyl estradiol (JUNEL FE 1/20) 1-20 MG-MCG tablet, Take 1 tablet by mouth daily., Disp: 28 tablet, Rfl: 11   Family History  Problem Relation Age of Onset  . Bipolar disorder Mother   . Hypertension Father   . Bipolar disorder Maternal Grandfather      Social History   Tobacco Use  . Smoking status: Never Smoker  . Smokeless tobacco: Never Used  Substance Use Topics  . Alcohol use: Yes    Alcohol/week: 2.0 standard drinks    Types: 2 Glasses of wine per week    Comment: 2 drinks a month  . Drug use: Never     Allergies as of 08/14/2020  . (No Known Allergies)    Review of Systems:    All systems reviewed and negative except where noted in HPI.   Physical Exam:  BP 119/81 (BP Location: Left Arm, Patient Position: Sitting, Cuff Size: Normal)   Pulse 98   Temp 98.7 F (37.1 C) (Oral)   Ht 5\' 4"  (1.626 m)   Wt 247 lb 8 oz (112.3 kg)   BMI 42.48 kg/m  No LMP recorded. 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  General:   Alert,  Well-developed, well-nourished, pleasant and cooperative in NAD Head:  Normocephalic and atraumatic. Eyes:  Sclera clear, no icterus.   Conjunctiva pink. Ears:  Normal auditory acuity. Nose:  No deformity, discharge, or lesions. Mouth:  No deformity or lesions,oropharynx pink & moist. Neck:  Supple; no masses or thyromegaly. Lungs:  Respirations even and unlabored.  Clear throughout to auscultation.   No wheezes, crackles, or rhonchi. No acute distress. Heart:  Regular rate and rhythm; no murmurs, clicks, rubs, or gallops. Abdomen:  Normal bowel sounds. Soft, non-tender and non-distended without masses, hepatosplenomegaly or hernias noted.  No guarding or rebound tenderness.   Rectal: Not performed Msk:  Symmetrical without gross deformities. Good, equal movement & strength bilaterally. Pulses:  Normal pulses noted. Extremities:  No clubbing or edema.  No cyanosis. Neurologic:  Alert and oriented x3;  grossly normal neurologically. Skin:  Intact without significant lesions or rashes. No jaundice. Psych:  Alert and cooperative. Normal mood and affect.  Imaging Studies: Reviewed  Assessment and Plan:   Linda Chambers is a 25 y.o. female with history of bipolar, eating disorder, obesity, BMI 41.9, PCOS, fatty liver is seen in consultation for more than 6 months history of generalized abdominal cramps associated with abdominal bloating, increased bowel frequency with no other constitutional symptoms.  Her symptoms are consistent with diarrhea predominant irritable  bowel syndrome  H. pylori breath test as well as GI profile PCR were negative FD guard helped with IBS symptoms, more samples given to her today Patient is currently on Lexapro which has partially relieved her IBS symptoms, however she is gaining weight as a side effect of medication  Fatty liver Most recent LFTs are normal Encouraged her to continue regular exercise and work on healthy eating habits Check LFTs annually   Follow up as needed   21, MD

## 2020-08-17 ENCOUNTER — Encounter: Payer: Self-pay | Admitting: Family Medicine

## 2020-08-17 ENCOUNTER — Ambulatory Visit (INDEPENDENT_AMBULATORY_CARE_PROVIDER_SITE_OTHER): Payer: BLUE CROSS/BLUE SHIELD | Admitting: Family Medicine

## 2020-08-17 ENCOUNTER — Other Ambulatory Visit: Payer: Self-pay

## 2020-08-17 VITALS — BP 120/70 | HR 103 | Ht 64.75 in | Wt 247.8 lb

## 2020-08-17 DIAGNOSIS — Z0000 Encounter for general adult medical examination without abnormal findings: Secondary | ICD-10-CM | POA: Diagnosis not present

## 2020-08-17 DIAGNOSIS — E7800 Pure hypercholesterolemia, unspecified: Secondary | ICD-10-CM

## 2020-08-17 DIAGNOSIS — G4733 Obstructive sleep apnea (adult) (pediatric): Secondary | ICD-10-CM

## 2020-08-17 NOTE — Progress Notes (Signed)
Subjective:    Patient ID: Linda Chambers, female    DOB: [birth year], 25 y.o.   MRN: [id number]  HPI Chief Complaint  Patient presents with  . nonfasting cpe    Nonfasting cpe, dry skin patches that doesn't see to go away. Has a family history of breast screening.    She is new to the practice and here for a complete physical exam.  Other providers: OB/GYN GI - Dr. Allegra Lai Dr. Marisue Ivan - psychiatrist   On Lexapro which is new and started by psych  On CPAP for OSA and doing well.   States she recently found out she has IBS and she is trying to get her bowel regulated. Working with GI on this.   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 Wears hard contacts   Wears seatbelt always, uses sunscreen, smoke detectors in home and functioning, does not text while driving and feels safe in home environment.   Reviewed allergies, medications, past medical, surgical, family, and social history.   Review of Systems Review of Systems Constitutional: -fever, -chills, -sweats, -unexpected weight change,-fatigue ENT: -runny nose, -ear pain, -sore throat Cardiology:  -chest pain, -palpitations, -edema Respiratory: -cough, -shortness of breath, -wheezing Gastroenterology: -abdominal pain, -nausea, -vomiting, -diarrhea, -constipation  Hematology: -bleeding or bruising problems Musculoskeletal: -arthralgias, -myalgias, -joint swelling, -back pain Ophthalmology: -vision changes Urology: -dysuria, -difficulty urinating, -hematuria, -urinary frequency, -urgency Neurology: -headache, -weakness, -tingling, -numbness       Objective:   Physical Exam BP 120/70   Pulse (!) 103   Ht 5' 4.75" (1.645  m)   Wt 247 lb 12.8 oz (112.4 kg)   BMI 41.56 kg/m   General Appearance:    Alert, cooperative, no distress, appears stated age  Head:    Normocephalic, without obvious abnormality, atraumatic  Eyes:    PERRL, conjunctiva/corneas clear, EOM's intact  Ears:    Normal TM's and external ear canals, some cerumen noted   Nose:   Mask on   Throat:   Mask on   Neck:   Supple, no lymphadenopathy;  thyroid:  no   enlargement/tenderness/nodules; no JVD  Back:    Spine nontender, no curvature, ROM normal, no CVA     tenderness  Lungs:     Clear to auscultation bilaterally without wheezes, rales or     ronchi; respirations unlabored  Chest Wall:    No tenderness or deformity   Heart:    Regular rate and rhythm, S1 and S2 normal, no murmur, rub   or gallop  Breast Exam:    OB/GYN   Abdomen:     Soft, non-tender, nondistended, normoactive bowel sounds,    no masses, no hepatosplenomegaly  Genitalia:   OB/GYN  Rectal:    Not performed due to age<40 and no related complaints  Extremities:   No clubbing, cyanosis or edema  Pulses:   2+ and symmetric all extremities  Skin:   Skin color, texture, turgor normal, no rashes or lesions  Lymph nodes:   Cervical, supraclavicular nodes normal  Neurologic:   CNII-XII intact, normal strength, sensation and gait          Psych:   Normal mood, affect, hygiene and  grooming.         Assessment & Plan:  Routine general medical examination at a health care facility -Have health care reviewed.  She sees OB/GYN.  Up-to-date on dental exam.  Wears hard contact lenses and is due for an eye exam.  Counseling on healthy lifestyle including diet and exercise.  Immunizations reviewed.  Discussed safety and health promotion. She will continue seeing her psychiatrist for her mood which seems to be stable.  Hypercholesteremia - Plan: Lipid panel -She is not fasting today so she will return next week for fasting lipids.  OSA (obstructive sleep apnea) -Continue CPAP.  She  feels that she is benefiting from this.

## 2020-08-17 NOTE — Patient Instructions (Signed)
Preventive Care 23-23 Years Old, Female Preventive care refers to lifestyle choices and visits with your health care provider that can promote health and wellness. This includes:  A yearly physical exam. This is also called an annual wellness visit.  Regular dental and eye exams.  Immunizations.  Screening for certain conditions.  Healthy lifestyle choices, such as: ? Eating a healthy diet. ? Getting regular exercise. ? Not using drugs or products that contain nicotine and tobacco. ? Limiting alcohol use. What can I expect for my preventive care visit? Physical exam Your health care provider may check your:  Height and weight. These may be used to calculate your BMI (body mass index). BMI is a measurement that tells if you are at a healthy weight.  Heart rate and blood pressure.  Body temperature.  Skin for abnormal spots. Counseling Your health care provider may ask you questions about your:  Past medical problems.  Family's medical history.  Alcohol, tobacco, and drug use.  Emotional well-being.  Home life and relationship well-being.  Sexual activity.  Diet, exercise, and sleep habits.  Work and work environment.  Access to firearms.  Method of birth control.  Menstrual cycle.  Pregnancy history. What immunizations do I need? Vaccines are usually given at various ages, according to a schedule. Your health care provider will recommend vaccines for you based on your age, medical history, and lifestyle or other factors, such as travel or where you work.   What tests do I need? Blood tests  Lipid and cholesterol levels. These may be checked every 5 years starting at age 20.  Hepatitis C test.  Hepatitis B test. Screening  Diabetes screening. This is done by checking your blood sugar (glucose) after you have not eaten for a while (fasting).  STD (sexually transmitted disease) testing, if you are at risk.  BRCA-related cancer screening. This may be  done if you have a family history of breast, ovarian, tubal, or peritoneal cancers.  Pelvic exam and Pap test. This may be done every 3 years starting at age 21. Starting at age 30, this may be done every 5 years if you have a Pap test in combination with an HPV test. Talk with your health care provider about your test results, treatment options, and if necessary, the need for more tests.   Follow these instructions at home: Eating and drinking  Eat a healthy diet that includes fresh fruits and vegetables, whole grains, lean protein, and low-fat dairy products.  Take vitamin and mineral supplements as recommended by your health care provider.  Do not drink alcohol if: ? Your health care provider tells you not to drink. ? You are pregnant, may be pregnant, or are planning to become pregnant.  If you drink alcohol: ? Limit how much you have to 0-1 drink a day. ? Be aware of how much alcohol is in your drink. In the [place] one drink equals one 12 oz bottle of beer (355 mL), one 5 oz glass of wine (148 mL), or one 1 oz glass of hard liquor (44 mL).   Lifestyle  Take daily care of your teeth and gums. Brush your teeth every morning and night with fluoride toothpaste. Floss one time each day.  Stay active. Exercise for at least 30 minutes 5 or more days each week.  Do not use any products that contain nicotine or tobacco, such as cigarettes, e-cigarettes, and chewing tobacco. If you need help quitting, ask your health care provider.  Do not   use drugs.  If you are sexually active, practice safe sex. Use a condom or other form of protection to prevent STIs (sexually transmitted infections).  If you do not wish to become pregnant, use a form of birth control. If you plan to become pregnant, see your health care provider for a prepregnancy visit.  Find healthy ways to cope with stress, such as: ? Meditation, yoga, or listening to music. ? Journaling. ? Talking to a trusted  person. ? Spending time with friends and family. Safety  Always wear your seat belt while driving or riding in a vehicle.  Do not drive: ? If you have been drinking alcohol. Do not ride with someone who has been drinking. ? When you are tired or distracted. ? While texting.  Wear a helmet and other protective equipment during sports activities.  If you have firearms in your house, make sure you follow all gun safety procedures.  Seek help if you have been physically or sexually abused. What's next?  Go to your health care provider once a year for an annual wellness visit.  Ask your health care provider how often you should have your eyes and teeth checked.  Stay up to date on all vaccines. This information is not intended to replace advice given to you by your health care provider. Make sure you discuss any questions you have with your health care provider. Document Revised: 11/27/2019 Document Reviewed: 12/10/2017 Elsevier Patient Education  2021 Elsevier Inc.  

## 2020-08-21 ENCOUNTER — Other Ambulatory Visit: Payer: BLUE CROSS/BLUE SHIELD

## 2020-08-21 ENCOUNTER — Other Ambulatory Visit: Payer: Self-pay

## 2020-08-21 DIAGNOSIS — E7800 Pure hypercholesterolemia, unspecified: Secondary | ICD-10-CM

## 2020-08-21 LAB — LIPID PANEL
Chol/HDL Ratio: 3.4 ratio (ref 0.0–4.4)
Cholesterol, Total: 161 mg/dL (ref 100–199)
HDL: 48 mg/dL (ref >39)
LDL Chol Calc (NIH): 86 mg/dL (ref 0–99)
Triglycerides: 154 mg/dL — ABNORMAL HIGH (ref 0–149)
VLDL Cholesterol Cal: 27 mg/dL (ref 5–40)

## 2020-09-05 ENCOUNTER — Other Ambulatory Visit: Payer: Self-pay | Admitting: Internal Medicine

## 2020-09-05 ENCOUNTER — Telehealth: Payer: Self-pay | Admitting: Family Medicine

## 2020-09-05 DIAGNOSIS — Z111 Encounter for screening for respiratory tuberculosis: Secondary | ICD-10-CM

## 2020-09-05 NOTE — Telephone Encounter (Signed)
Pt stopped by and dropped off some forms that need to be completed for her new job. Form placed in your folder upfront to go back

## 2020-09-06 ENCOUNTER — Other Ambulatory Visit: Payer: BLUE CROSS/BLUE SHIELD

## 2020-09-06 ENCOUNTER — Other Ambulatory Visit: Payer: Self-pay

## 2020-09-08 LAB — QUANTIFERON-TB GOLD PLUS
QuantiFERON Mitogen Value: >10 IU/mL
QuantiFERON Nil Value: 0.02 IU/mL
QuantiFERON TB1 Ag Value: 0.02 IU/mL
QuantiFERON TB2 Ag Value: 0.01 IU/mL
QuantiFERON-TB Gold Plus: NEGATIVE

## 2020-10-08 ENCOUNTER — Encounter: Payer: Self-pay | Admitting: Intensive Care

## 2020-10-08 ENCOUNTER — Other Ambulatory Visit: Payer: Self-pay

## 2020-10-08 DIAGNOSIS — R42 Dizziness and giddiness: Secondary | ICD-10-CM | POA: Diagnosis not present

## 2020-10-08 DIAGNOSIS — J45909 Unspecified asthma, uncomplicated: Secondary | ICD-10-CM | POA: Insufficient documentation

## 2020-10-08 DIAGNOSIS — R197 Diarrhea, unspecified: Secondary | ICD-10-CM | POA: Insufficient documentation

## 2020-10-08 DIAGNOSIS — R1084 Generalized abdominal pain: Secondary | ICD-10-CM | POA: Diagnosis not present

## 2020-10-08 DIAGNOSIS — R110 Nausea: Secondary | ICD-10-CM | POA: Insufficient documentation

## 2020-10-08 LAB — COMPREHENSIVE METABOLIC PANEL
ALT: 54 U/L — ABNORMAL HIGH (ref 0–44)
AST: 49 U/L — ABNORMAL HIGH (ref 15–41)
Albumin: 3.7 g/dL (ref 3.5–5.0)
Alkaline Phosphatase: 68 U/L (ref 38–126)
Anion gap: 8 (ref 5–15)
BUN: 12 mg/dL (ref 6–20)
CO2: 26 mmol/L (ref 22–32)
Calcium: 8.9 mg/dL (ref 8.9–10.3)
Chloride: 105 mmol/L (ref 98–111)
Creatinine, Ser: 0.66 mg/dL (ref 0.44–1.00)
GFR, Estimated: >60 mL/min (ref >60)
Glucose, Bld: 119 mg/dL — ABNORMAL HIGH (ref 70–99)
Potassium: 3.7 mmol/L (ref 3.5–5.1)
Sodium: 139 mmol/L (ref 135–145)
Total Bilirubin: 0.5 mg/dL (ref 0.3–1.2)
Total Protein: 7.3 g/dL (ref 6.5–8.1)

## 2020-10-08 LAB — CBC
HCT: 39.8 % (ref 36.0–46.0)
Hemoglobin: 13 g/dL (ref 12.0–15.0)
MCH: 24.8 pg — ABNORMAL LOW (ref 26.0–34.0)
MCHC: 32.7 g/dL (ref 30.0–36.0)
MCV: 76 fL — ABNORMAL LOW (ref 80.0–100.0)
Platelets: 427 10*3/uL — ABNORMAL HIGH (ref 150–400)
RBC: 5.24 MIL/uL — ABNORMAL HIGH (ref 3.87–5.11)
RDW: 15.7 % — ABNORMAL HIGH (ref 11.5–15.5)
WBC: 10.7 10*3/uL — ABNORMAL HIGH (ref 4.0–10.5)
nRBC: 0 % (ref 0.0–0.2)

## 2020-10-08 LAB — LIPASE, BLOOD: Lipase: 37 U/L (ref 11–51)

## 2020-10-08 NOTE — ED Triage Notes (Signed)
Patient c/o dizziness and abdominal pain with intermittent diarrhea since Wednesday 10/03/20 HX ovarian cysts

## 2020-10-09 ENCOUNTER — Emergency Department: Payer: BLUE CROSS/BLUE SHIELD

## 2020-10-09 ENCOUNTER — Ambulatory Visit: Payer: BLUE CROSS/BLUE SHIELD | Admitting: Family Medicine

## 2020-10-09 ENCOUNTER — Emergency Department
Admission: EM | Admit: 2020-10-09 | Discharge: 2020-10-09 | Disposition: A | Discharge status: Home / Self Care | Payer: BLUE CROSS/BLUE SHIELD | Attending: Emergency Medicine | Admitting: Emergency Medicine

## 2020-10-09 DIAGNOSIS — R197 Diarrhea, unspecified: Secondary | ICD-10-CM

## 2020-10-09 DIAGNOSIS — R1084 Generalized abdominal pain: Secondary | ICD-10-CM

## 2020-10-09 HISTORY — DX: Irritable bowel syndrome, unspecified: K58.9

## 2020-10-09 LAB — URINALYSIS, COMPLETE (UACMP) WITH MICROSCOPIC
Bacteria, UA: NONE SEEN
Bilirubin Urine: NEGATIVE
Glucose, UA: NEGATIVE mg/dL
Hgb urine dipstick: NEGATIVE
Ketones, ur: NEGATIVE mg/dL
Nitrite: NEGATIVE
Protein, ur: NEGATIVE mg/dL
Specific Gravity, Urine: 1.026 (ref 1.005–1.030)
pH: 5 (ref 5.0–8.0)

## 2020-10-09 MED ORDER — IOHEXOL 300 MG/ML  SOLN
125.0000 mL | Freq: Once | INTRAMUSCULAR | Status: AC | PRN
  Administered 2020-10-09: 125 mL via INTRAVENOUS

## 2020-10-09 MED ORDER — FOSFOMYCIN TROMETHAMINE 3 G PO PACK
3.0000 g | PACK | Freq: Once | ORAL | Status: AC
  Administered 2020-10-09: 3 g via ORAL
  Filled 2020-10-09: qty 3

## 2020-10-09 MED ORDER — ONDANSETRON 4 MG PO TBDP: 4.0000 mg | ORAL_TABLET | Freq: Three times a day (TID) | ORAL | 0 refills | Status: DC | PRN

## 2020-10-09 MED ORDER — DICYCLOMINE HCL 20 MG PO TABS: 20.0000 mg | ORAL_TABLET | Freq: Four times a day (QID) | ORAL | 0 refills | Status: DC

## 2020-10-09 MED ORDER — IOHEXOL 9 MG/ML PO SOLN
500.0000 mL | ORAL | Status: DC
  Administered 2020-10-09: 500 mL via ORAL

## 2020-10-09 MED ORDER — SODIUM CHLORIDE 0.9 % IV BOLUS
1000.0000 mL | Freq: Once | INTRAVENOUS | Status: AC
  Administered 2020-10-09: 1000 mL via INTRAVENOUS

## 2020-10-09 MED ORDER — ONDANSETRON HCL 4 MG/2ML IJ SOLN
4.0000 mg | Freq: Once | INTRAMUSCULAR | Status: AC
  Administered 2020-10-09: 4 mg via INTRAVENOUS
  Filled 2020-10-09: qty 2

## 2020-10-09 NOTE — ED Provider Notes (Signed)
[facility] Emergency Department Provider Note   ____________________________________________   Event Date/Time   First MD Initiated Contact with Patient 10/09/20 0113     (approximate)  I have reviewed the triage vital signs and the nursing notes.   HISTORY  Chief Complaint Abdominal Pain and Dizziness    HPI Linda Chambers is a 25 y.o. female who presents to the ED from home with a chief complaint of abdominal pain and dizziness.  Reports recent diagnosis of IBS.  Reports abdominal pain, nausea and diarrhea x6 days.  States last week she had a bout of dizziness and unsure if she passed out.  Denies fever, cough, chest pain, shortness of breath, vomiting, dysuria.  History of ovarian cyst and states some of the pain feels similarly.  Denies recent travel, camping or antibiotic use.     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 Authorizing Provider  dicyclomine (BENTYL) 20 MG tablet Take 1 tablet (20 mg total) by mouth every 6 (six) hours. 10/09/20  Yes Paulette Blanch, MD  ondansetron (ZOFRAN ODT) 4 MG disintegrating tablet Take 1 tablet (4 mg total) by mouth every 8 (eight) hours as needed for nausea or vomiting. 10/09/20  Yes Paulette Blanch, MD  cetirizine (ZYRTEC) 10 MG tablet Take 1 tablet by mouth at bedtime.    [provider]  Cyanocobalamin (VITAMIN B-12 PO) Take 1 tablet by mouth at bedtime.    [provider]  escitalopram (LEXAPRO) 10 MG tablet Take 10 mg by mouth daily.    [provider]  icosapent Ethyl (VASCEPA) 1 g capsule Take 2 capsules (2 g total) by mouth 2 (two) times daily. 01/05/20   Henson, [name] L, NP-C  levalbuterol (XOPENEX HFA) 45 MCG/ACT inhaler Inhale 1 puff into the lungs every 4 (four) hours as needed for wheezing. PRN when exercise    [provider]  Lurasidone HCl 60 MG TABS Take 60 mg by mouth at bedtime.    [provider]  Multiple Vitamin (MULTIVITAMIN) tablet Take 1 tablet by mouth at bedtime.    [provider]  norethindrone-ethinyl estradiol (JUNEL FE 1/20) 1-20 MG-MCG tablet Take 1 tablet by mouth daily. 03/30/20   Homero Fellers, MD    Allergies Patient has no known allergies.  Family History  Problem Relation Age of Onset   Bipolar disorder Mother    Hypertension Father    Bipolar disorder Maternal Grandfather     Social History Social History   Tobacco Use   Smoking status: Never   Smokeless tobacco: Never  Substance Use Topics   Alcohol use: Yes  Alcohol/week: 1.0 standard drink    Types: 1 Glasses of wine per week    Comment: social events   Drug use: Yes    Comment: THC    Review of Systems  Constitutional: No fever/chills Eyes: No visual changes. ENT: No sore throat. Cardiovascular: Denies chest pain. Respiratory: Denies shortness of breath. Gastrointestinal: Positive for abdominal pain.  Positive for nausea, no vomiting.  Positive for diarrhea.  No constipation. Genitourinary: Negative for dysuria. Musculoskeletal: Negative for back pain. Skin: Negative for rash. Neurological: Negative for headaches, focal weakness or numbness.   ____________________________________________   PHYSICAL  EXAM:  VITAL SIGNS: ED Triage Vitals [10/08/20 1824]  Enc Vitals Group     BP (!) 130/92     Pulse Rate (!) 108     Resp 16     Temp 98.5 F (36.9 C)     Temp Source Oral     SpO2 96 %     Weight 247 lb (112 kg)     Height _0  (1.626 m)     Head Circumference      Peak Flow      Pain Score 8     Pain Loc      Pain Edu?      Excl. in [place]?     Constitutional: Alert and oriented. Well appearing and in no acute distress. Eyes: Conjunctivae are normal. PERRL. EOMI. Head: Atraumatic. Nose: No congestion/rhinnorhea. Mouth/Throat: Mucous membranes are moist.   Neck: No stridor.   Cardiovascular: Normal rate, regular rhythm. Grossly normal heart sounds.  Good peripheral circulation. Respiratory: Normal respiratory effort.  No retractions. Lungs CTAB. Gastrointestinal: Obese.  Soft with mild diffuse tenderness to palpation without rebound or guarding. No distention. No abdominal bruits. No CVA tenderness. Musculoskeletal: No lower extremity tenderness nor edema.  No joint effusions. Neurologic:  Normal speech and language. No gross focal neurologic deficits are appreciated. No gait instability. Skin:  Skin is warm, dry and intact. No rash noted. Psychiatric: Mood and affect are normal. Speech and behavior are normal.  ____________________________________________   LABS (all labs ordered are listed, but only abnormal results are displayed)  Labs Reviewed  COMPREHENSIVE METABOLIC PANEL - Abnormal; Notable for the following components:      Result Value   Glucose, Bld 119 (*)    AST 49 (*)    ALT 54 (*)    All other components within normal limits  CBC - Abnormal; Notable for the following components:   WBC 10.7 (*)    RBC 5.24 (*)    MCV 76.0 (*)    MCH 24.8 (*)    RDW 15.7 (*)    Platelets 427 (*)    All other components within normal limits  URINALYSIS, COMPLETE (UACMP) WITH MICROSCOPIC - Abnormal; Notable for the following components:   Color, Urine YELLOW (*)     APPearance HAZY (*)    Leukocytes,Ua TRACE (*)    All other components within normal limits  C DIFFICILE QUICK SCREEN W PCR REFLEX    GASTROINTESTINAL PANEL BY PCR, STOOL (REPLACES STOOL CULTURE)  LIPASE, BLOOD   ____________________________________________  EKG  ED ECG REPORT I, Haven Foss J, the attending physician, personally viewed and interpreted this ECG.   Date: 10/09/2020  EKG Time: 1832  Rate: 104  Rhythm: sinus tachycardia  Axis: Normal  Intervals:none  ST&T Change: Nonspecific  ____________________________________________  RADIOLOGY I, Hellen Shanley J, personally viewed and evaluated these images (plain radiographs) as part of my medical decision making, as well  as reviewing the written report by the radiologist.  ED MD interpretation: Unremarkable CT abdomen/pelvis  Official radiology report(s): CT Abdomen Pelvis W Contrast  Result Date: 10/09/2020 CLINICAL DATA:  Abdominal pain EXAM: CT ABDOMEN AND PELVIS WITH CONTRAST TECHNIQUE: Multidetector CT imaging of the abdomen and pelvis was performed using the standard protocol following bolus administration of intravenous contrast. CONTRAST:  135m OMNIPAQUE IOHEXOL 300 MG/ML  SOLN COMPARISON:  03/22/2020 FINDINGS: Lower chest: No acute abnormality. Hepatobiliary: Diffuse low-density throughout the liver compatible with fatty infiltration. No focal abnormality. Gallbladder unremarkable. Pancreas: No focal abnormality or ductal dilatation. Spleen: No focal abnormality.  Normal size. Adrenals/Urinary Tract: No adrenal abnormality. No focal renal abnormality. No stones or hydronephrosis. Urinary bladder is unremarkable. Stomach/Bowel: Normal appendix. Stomach, large and small bowel grossly unremarkable. Vascular/Lymphatic: No evidence of aneurysm or adenopathy. Reproductive: Uterus absent.  No adnexal mass. Other: No free fluid or free air. Musculoskeletal: No acute bony abnormality. IMPRESSION: Hepatic steatosis. No acute findings.  Electronically Signed   By: KRolm BaptiseM.D.   On: 10/09/2020 03:42    ____________________________________________   PROCEDURES  Procedure(s) performed (including Critical Care):  Procedures   ____________________________________________   INITIAL IMPRESSION / ASSESSMENT AND PLAN / ED COURSE  As part of my medical decision making, I reviewed the following data within the [employer] reviewed and incorporated, Labs reviewed, EKG interpreted, Old chart reviewed, Radiograph reviewed, and Notes from prior ED visits     25year old female presenting with a 6-day history of abdominal pain, diarrhea and dizziness Differential diagnosis includes, but is not limited to, ovarian cyst, ovarian torsion, acute appendicitis, diverticulitis, urinary tract infection/pyelonephritis, endometriosis, bowel obstruction, colitis, renal colic, gastroenteritis, hernia, fibroids, endometriosis, pregnancy related pain including ectopic pregnancy, etc.   Laboratory results unremarkable.  Will order stool studies, CT abdomen/pelvis.  Initiate IV fluid hydration, IV Zofran for nausea.  Will reassess.  Clinical Course as of 10/09/20 0649  Tue Oct 09, 2020  0417 Patient resting comfortably in no acute distress.  Family at bedside.  Updated them of all test results.  Fosfomycin for trace leukocyte in urine.  Will discharge home with stool collection kit that patient may return to outpatient laboratory.  We will prescribe Bentyl and Zofran to use as needed.  Strict return precautions given.  Patient verbalizes understanding and agrees with plan of care. [JS]    Clinical Course User Index [JS] SPaulette Blanch MD     ____________________________________________   FINAL CLINICAL IMPRESSION(S) / ED DIAGNOSES  Final diagnoses:  Generalized abdominal pain  Diarrhea, unspecified type     ED Discharge Orders          Ordered    dicyclomine (BENTYL) 20 MG tablet  Every 6 hours         10/09/20 0419    ondansetron (ZOFRAN ODT) 4 MG disintegrating tablet  Every 8 hours PRN        10/09/20 0419             Note:  This document was prepared using Dragon voice recognition software and may include unintentional dictation errors.    SPaulette Blanch MD 10/09/20 [phone]

## 2020-10-09 NOTE — ED Notes (Signed)
Patient aware that we need urine sample for testing, unable at this time. Pt given instruction on providing urine sample when able to do so. Hat placed in toilet to obtain urine/stool sample

## 2020-10-09 NOTE — Discharge Instructions (Signed)
1.  You may return stool specimen to outpatient laboratory at your convenience. 2.  You may take medicines as needed for nausea and abdominal discomfort (Zofran/Bentyl). 3.  Return to the ER for worsening symptoms, persistent vomiting, difficulty breathing or other concerns.

## 2020-10-11 ENCOUNTER — Telehealth: Payer: Self-pay | Admitting: Internal Medicine

## 2020-10-11 NOTE — Telephone Encounter (Signed)
Pt called and states that she went to ER on 10/09/20 for abdominal pain and dizzness. AP has been going on a month and saw GI and was diagnosed with IBS but it feels different than that. She is having some diarrhea, not conspitatied. The bentyl that she was given in ER isn't helping the stomach pain. She was suppose to do collect stool sample but she just had this done a month ago and was all negative. So she didn't know if she needed this again. Pt is having dizziness for the last 2 weeks. Its so bad that she has had to miss work 3 days this week. She wants to know what she needs to do for the abdominal pain and dizziness. She has an appt next week but can't go that long waiting without knowing what she can do over the weekend. Please advised

## 2020-10-11 NOTE — Telephone Encounter (Signed)
Patient advised.

## 2020-10-11 NOTE — Telephone Encounter (Signed)
Needs to be seen next week. In the mean time, she should stay well hydrated. She should eat a bland diet (no fried/greasy foods, nothing spicy), avoid alcohol. Take medications as recommended/suggested by the ER and GI doctors. Use tylenol as needed for pain.

## 2020-10-17 ENCOUNTER — Inpatient Hospital Stay: Payer: BLUE CROSS/BLUE SHIELD | Admitting: Medical

## 2020-10-18 ENCOUNTER — Other Ambulatory Visit: Payer: Self-pay

## 2020-10-18 ENCOUNTER — Encounter: Payer: Self-pay | Admitting: Medical

## 2020-10-18 ENCOUNTER — Ambulatory Visit (INDEPENDENT_AMBULATORY_CARE_PROVIDER_SITE_OTHER): Payer: BLUE CROSS/BLUE SHIELD | Admitting: Medical

## 2020-10-18 VITALS — BP 120/80 | HR 89 | Temp 98.9°F | Wt 248.8 lb

## 2020-10-18 DIAGNOSIS — Z113 Encounter for screening for infections with a predominantly sexual mode of transmission: Secondary | ICD-10-CM

## 2020-10-18 DIAGNOSIS — R109 Unspecified abdominal pain: Secondary | ICD-10-CM

## 2020-10-18 DIAGNOSIS — R7309 Other abnormal glucose: Secondary | ICD-10-CM

## 2020-10-18 DIAGNOSIS — M545 Low back pain, unspecified: Secondary | ICD-10-CM

## 2020-10-18 DIAGNOSIS — Z131 Encounter for screening for diabetes mellitus: Secondary | ICD-10-CM | POA: Diagnosis not present

## 2020-10-18 DIAGNOSIS — J4520 Mild intermittent asthma, uncomplicated: Secondary | ICD-10-CM

## 2020-10-18 DIAGNOSIS — Q8789 Other specified congenital malformation syndromes, not elsewhere classified: Secondary | ICD-10-CM

## 2020-10-18 DIAGNOSIS — K589 Irritable bowel syndrome without diarrhea: Secondary | ICD-10-CM | POA: Insufficient documentation

## 2020-10-18 DIAGNOSIS — N390 Urinary tract infection, site not specified: Secondary | ICD-10-CM

## 2020-10-18 DIAGNOSIS — R110 Nausea: Secondary | ICD-10-CM | POA: Insufficient documentation

## 2020-10-18 DIAGNOSIS — Q528 Other specified congenital malformations of female genitalia: Secondary | ICD-10-CM

## 2020-10-18 LAB — POCT GLYCOSYLATED HEMOGLOBIN (HGB A1C): Hemoglobin A1C: 5.8 % — AB (ref 4.0–5.6)

## 2020-10-18 LAB — POCT URINALYSIS DIP (PROADVANTAGE DEVICE)
Bilirubin, UA: NEGATIVE
Blood, UA: NEGATIVE
Glucose, UA: NEGATIVE mg/dL
Ketones, POC UA: NEGATIVE mg/dL
Leukocytes, UA: NEGATIVE
Nitrite, UA: NEGATIVE
Protein Ur, POC: NEGATIVE mg/dL
Specific Gravity, Urine: 1.025
Urobilinogen, Ur: NEGATIVE
pH, UA: 6 (ref 5.0–8.0)

## 2020-10-18 MED ORDER — ALBUTEROL SULFATE HFA 108 (90 BASE) MCG/ACT IN AERS: 2.0000 | INHALATION_SPRAY | Freq: Four times a day (QID) | RESPIRATORY_TRACT | 0 refills | Status: DC | PRN

## 2020-10-18 MED ORDER — LEVALBUTEROL TARTRATE 45 MCG/ACT IN AERO: 1.0000 | INHALATION_SPRAY | RESPIRATORY_TRACT | 0 refills | Status: DC | PRN

## 2020-10-18 NOTE — Progress Notes (Signed)
Subjective:  Linda Chambers is a 25 y.o. female who presents for Chief Complaint  Patient presents with   ER follow-up    Lower back pain, stomach pain, not having anymore dizziness. 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  She was seen on October 09, 2020 for dizziness abdominal pain, nausea.  There was concern she had a urinary tract infection.  She was given 1 round of fosfomycin and IV fluids.  She was sent home with Zofran but no other antibiotics.  She continued to feel bad for couple days and went to an urgent care.  They treated her for migraine.  She is improving some but still having intermittent lower abdominal discomfort, low back pain.  The dizziness resolved.  She does not feel significantly improved  She denies fever but does still hot in the evenings.  Denies urine odor or cloudy urine.  She is drinking a decent amount of water.  She has nausea but attributes this to her IBS.  She was diagnosed with IBS a few months ago.  She does get a lot of bowel fluctuations but that is kind of normal for her now.  She denies any recent strenuous exercise to explain back pain, no heavy lifting or fall.  Last UTI was 2020.  But in the past urinary tract infections really wiped her out, like she feels now.  She is leaving this afternoon on the plane to go to [place] for vacation.  She has a history of asthma and would like inhaler refilled to have on hand just in case.  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  She has had surgery early in life including removal of tubes.  She still has ovaries.  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  She is sexually active.  She has 1 long-term female  partner x4 years and 1 new female partner in the last month  No other aggravating or relieving factors.    No other c/o.  The following portions of the patient's history were reviewed and updated as appropriate: allergies, current medications, past family history, past medical history, past social history, past surgical history and problem list.  ROS Otherwise as in subjective above     Objective: BP 120/80   Pulse 89   Temp 98.9 F (37.2 C)   Wt 248 lb 12.8 oz (112.9 kg)   SpO2 98%   BMI 42.71 kg/m   General appearance: alert, no distress, well developed, well nourished Heart: RRR, normal S1, S2, no murmurs Lungs: CTA bilaterally, no wheezes, rhonchi, or rales Abdomen: +bs, soft, mild generalized discomfort, otherwise non tender, non distended, no masses, no hepatomegaly, no splenomegaly Back nontender, no pain with range of motion, negative straight leg raise Pulses: 2+ radial pulses, 2+ pedal pulses, normal cap refill Ext: no edema GU deferred /declined    Assessment: Encounter Diagnoses  Name Primary?   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  Given her ongoing symptoms we will send out urine culture today.  Her urinalysis looked improved compared Emergency Department but she still has symptoms.  We will screen for STD today given new sexual partner.  Elevated liver test on recent labs.  We will need to discuss this further at next visit.  I suspect some fatty liver disease but needs other  evaluation on this.  Looking back in her chart record her liver test have been up in the past as well  Elevated glucose on several prior labs.  Hemoglobin A1c borderline for diabetes today  History of asthma-refilled inhaler for as needed use  IBS recent diagnosis-sees gastroenterology  Nausea-she will call back tomorrow to let us know where she wants to refill sent for Zofran  I recommended she call back right away within the next 48 hours if any worsening symptoms while we are awaiting culture results.  There will be a low threshold for adding Macrobid.    Linda Chambers was seen today for er follow-up.  Diagnoses and all orders for this visit:  Urinary tract infection without hematuria, site unspecified -     POCT Urinalysis DIP (Proadvantage Device) -     Urine Culture -     CBC with Differential/Platelet  Screen for STD (sexually transmitted disease) -     HIV Antibody (routine testing w rflx) -     RPR -     GC/Chlamydia Probe Amp -     Hepatitis C antibody -     Hepatitis B surface antigen  Nausea -     Urine Culture -     CBC with Differential/Platelet  Abdominal discomfort  Low back pain without sciatica, unspecified back pain laterality, unspecified chronicity  Screening for diabetes mellitus -     HgB A1c  Elevated glucose -     HgB A1c  Mayer-Rokitansky-Kuster-Hauser syndrome  Mild intermittent asthma without complication  Irritable bowel syndrome, unspecified type  Other orders -     albuterol (VENTOLIN HFA) 108 (90 Base) MCG/ACT inhaler; Inhale 2 puffs into the lungs every 6 (six) hours as needed for wheezing or shortness of breath. -     levalbuterol (XOPENEX HFA) 45 MCG/ACT inhaler; Inhale 1 puff into the lungs every 4 (four) hours as needed for wheezing. PRN when exercise    Follow up: pending labs

## 2020-10-19 ENCOUNTER — Other Ambulatory Visit: Payer: Self-pay | Admitting: Medical

## 2020-10-19 ENCOUNTER — Telehealth: Payer: Self-pay

## 2020-10-19 LAB — CBC WITH DIFFERENTIAL/PLATELET
Basophils Absolute: 0 10*3/uL (ref 0.0–0.2)
Basos: 0 %
EOS (ABSOLUTE): 0.1 10*3/uL (ref 0.0–0.4)
Eos: 1 %
Hematocrit: 39.9 % (ref 34.0–46.6)
Hemoglobin: 13.1 g/dL (ref 11.1–15.9)
Immature Grans (Abs): 0 10*3/uL (ref 0.0–0.1)
Immature Granulocytes: 0 %
Lymphocytes Absolute: 3.6 10*3/uL — ABNORMAL HIGH (ref 0.7–3.1)
Lymphs: 37 %
MCH: 25 pg — ABNORMAL LOW (ref 26.6–33.0)
MCHC: 32.8 g/dL (ref 31.5–35.7)
MCV: 76 fL — ABNORMAL LOW (ref 79–97)
Monocytes Absolute: 0.8 10*3/uL (ref 0.1–0.9)
Monocytes: 8 %
Neutrophils Absolute: 5.3 10*3/uL (ref 1.4–7.0)
Neutrophils: 54 %
Platelets: 443 10*3/uL (ref 150–450)
RBC: 5.23 x10E6/uL (ref 3.77–5.28)
RDW: 15.5 % — ABNORMAL HIGH (ref 11.7–15.4)
WBC: 9.8 10*3/uL (ref 3.4–10.8)

## 2020-10-19 LAB — HEPATITIS C ANTIBODY: Hep C Virus Ab: 0.2 s/co ratio (ref 0.0–0.9)

## 2020-10-19 LAB — HEPATITIS B SURFACE ANTIGEN: Hepatitis B Surface Ag: NEGATIVE

## 2020-10-19 LAB — HIV ANTIBODY (ROUTINE TESTING W REFLEX): HIV Screen 4th Generation wRfx: NONREACTIVE

## 2020-10-19 LAB — RPR: RPR Ser Ql: NONREACTIVE

## 2020-10-19 MED ORDER — ONDANSETRON 4 MG PO TBDP: 4.0000 mg | ORAL_TABLET | Freq: Three times a day (TID) | ORAL | 0 refills | Status: DC | PRN

## 2020-10-19 NOTE — Telephone Encounter (Signed)
Pt. Called stating you were gone to call in her nausea medicine for her Zofran but she went out of town right after her apt. Here yesterday. She wanted to know if [facility] could [facility] into [facility] [address] [facility] [place] [address]. I added the pharmacy top her preferred pharmacy list.

## 2020-10-20 LAB — URINE CULTURE

## 2020-10-20 LAB — GC/CHLAMYDIA PROBE AMP
Chlamydia trachomatis, NAA: NEGATIVE
Neisseria Gonorrhoeae by PCR: NEGATIVE

## 2020-11-13 ENCOUNTER — Other Ambulatory Visit: Payer: Self-pay

## 2020-11-13 ENCOUNTER — Ambulatory Visit (INDEPENDENT_AMBULATORY_CARE_PROVIDER_SITE_OTHER): Payer: BLUE CROSS/BLUE SHIELD | Admitting: Medical

## 2020-11-13 VITALS — BP 120/76 | HR 76 | Temp 98.7°F | Wt 248.0 lb

## 2020-11-13 DIAGNOSIS — R350 Frequency of micturition: Secondary | ICD-10-CM | POA: Diagnosis not present

## 2020-11-13 DIAGNOSIS — N898 Other specified noninflammatory disorders of vagina: Secondary | ICD-10-CM | POA: Diagnosis not present

## 2020-11-13 DIAGNOSIS — R519 Headache, unspecified: Secondary | ICD-10-CM | POA: Diagnosis not present

## 2020-11-13 LAB — POCT URINALYSIS DIP (PROADVANTAGE DEVICE)
Bilirubin, UA: NEGATIVE
Blood, UA: NEGATIVE
Glucose, UA: NEGATIVE mg/dL
Ketones, POC UA: NEGATIVE mg/dL
Leukocytes, UA: NEGATIVE
Nitrite, UA: NEGATIVE
Protein Ur, POC: NEGATIVE mg/dL
Specific Gravity, Urine: 1.015
Urobilinogen, Ur: NEGATIVE
pH, UA: 7 (ref 5.0–8.0)

## 2020-11-13 LAB — POCT WET PREP (WET MOUNT)
Clue Cells Wet Prep Whiff POC: NEGATIVE
Trichomonas Wet Prep HPF POC: ABSENT

## 2020-11-13 MED ORDER — FLUCONAZOLE 150 MG PO TABS: 150.0000 mg | ORAL_TABLET | Freq: Once | ORAL | 0 refills | Status: AC

## 2020-11-13 NOTE — Progress Notes (Signed)
Subjective:  Linda Chambers is a 25 y.o. female who presents for Chief Complaint  Patient presents with   UTI    Had ear infection last week and got put on amoxcillin and now having frequency urination, no discharge, some itching. Symptoms over the last couple  days.      Here for possible UTI.  Currently she notes urinary frequency, every 30-40 minutes, vaginal itching.  No discharge.   No blood in urine.  No odor in urine.  Has chronic IBS abdominal discomfort.   Has some right flank discomfort.     Had ear infection last week, was on Amoxicillin.  Just finished amoxicillin yesterday.    She notes UTI in 04/2020, and had the recent visit 10/18/20 with UTI symptoms.    She is good about her water intake.  She notes she just had recent STD screen with gynecology that was normal and no new partners.  She has questions about migraines.  Of note she brought this up at the end of the visit.  She notes headaches for about a year, has migraines on average 3 times per week.  She gets photophobia, nausea, no dizziness no numbness or tingling.  No weakness.  No head injury.  She is not sure of the trigger.  She would like something to help control the headaches.  Her grandmother is on preventative medicine for migraine  No other aggravating or relieving factors.    No other c/o.  The following portions of the patient's history were reviewed and updated as appropriate: allergies, current medications, past family history, past medical history, past social history, past surgical history and problem list.  ROS Otherwise as in subjective above  Objective: BP 120/76   Pulse 76   Temp 98.7 F (37.1 C)   Wt 248 lb (112.5 kg)   BMI 42.57 kg/m   General appearance: alert, no distress, well developed, well nourished Abdomen: +bs, soft, non tender, non distended, no masses, no hepatomegaly, no splenomegaly Back: nontender GU: External genitalia with some erythema of the vulva and slight white discharge,  no other abnormality, no adnexal mass or lesion. CN II through XII intact, nonfocal exam    Assessment: Encounter Diagnoses  Name Primary?   Urine frequency Yes   Vaginal itching    Frequent headaches      Plan: Wet prep shows yeast.  Begin medication below Diflucan.  No obvious signs of UTI today.  Urinalysis normal.  If not much improved within 1 week then call back   Linda Chambers was seen today for uti.  Diagnoses and all orders for this visit:  Urine frequency -     POCT Urinalysis DIP (Proadvantage Device) -     POCT Wet Prep [facility])  Vaginal itching -     POCT Wet Prep [facility])  Frequent headaches  Other orders -     fluconazole (DIFLUCAN) 150 MG tablet; Take 1 tablet (150 mg total) by mouth once for 1 dose. May repeat weekly   Follow up: Follow-up regarding headaches

## 2020-11-14 ENCOUNTER — Ambulatory Visit (INDEPENDENT_AMBULATORY_CARE_PROVIDER_SITE_OTHER): Payer: BLUE CROSS/BLUE SHIELD | Admitting: Medical

## 2020-11-14 VITALS — BP 120/70 | HR 89 | Wt 247.2 lb

## 2020-11-14 DIAGNOSIS — G43709 Chronic migraine without aura, not intractable, without status migrainosus: Secondary | ICD-10-CM

## 2020-11-14 DIAGNOSIS — Q528 Other specified congenital malformations of female genitalia: Secondary | ICD-10-CM

## 2020-11-14 DIAGNOSIS — G4733 Obstructive sleep apnea (adult) (pediatric): Secondary | ICD-10-CM | POA: Diagnosis not present

## 2020-11-14 DIAGNOSIS — Z9989 Dependence on other enabling machines and devices: Secondary | ICD-10-CM

## 2020-11-14 DIAGNOSIS — J309 Allergic rhinitis, unspecified: Secondary | ICD-10-CM

## 2020-11-14 DIAGNOSIS — J4520 Mild intermittent asthma, uncomplicated: Secondary | ICD-10-CM | POA: Diagnosis not present

## 2020-11-14 DIAGNOSIS — Q8789 Other specified congenital malformation syndromes, not elsewhere classified: Secondary | ICD-10-CM

## 2020-11-14 DIAGNOSIS — E349 Endocrine disorder, unspecified: Secondary | ICD-10-CM

## 2020-11-14 MED ORDER — RIZATRIPTAN BENZOATE 10 MG PO TBDP: 10.0000 mg | ORAL_TABLET | ORAL | 1 refills | Status: DC | PRN

## 2020-11-14 MED ORDER — ONDANSETRON 4 MG PO TBDP: 4.0000 mg | ORAL_TABLET | Freq: Three times a day (TID) | ORAL | 0 refills | Status: DC | PRN

## 2020-11-14 NOTE — Progress Notes (Signed)
Subjective:  Faith Patricelli is a 25 y.o. female who presents for Chief Complaint  Patient presents with   UTI    Had ear infection last week and got put on amoxcillin and now having frequency urination, no discharge, some itching. Symptoms over the last couple  days.      Here for possible UTI.  Currently she notes urinary frequency, every 30-40 minutes, vaginal itching.  No discharge.   No blood in urine.  No odor in urine.  Has chronic IBS abdominal discomfort.   Has some right flank discomfort.     Had ear infection last week, was on Amoxicillin.  Just finished amoxicillin yesterday.    She notes UTI in 04/2020, and had the recent visit 10/18/20 with UTI symptoms.    She is good about her water intake.  She notes she just had recent STD screen with gynecology that was normal and no new partners.  No other aggravating or relieving factors.    No other c/o.  The following portions of the patient's history were reviewed and updated as appropriate: allergies, current medications, past family history, past medical history, past social history, past surgical history and problem list.  ROS Otherwise as in subjective above  Objective: BP 120/76   Pulse 76   Temp 98.7 F (37.1 C)   Wt 248 lb (112.5 kg)   BMI 42.57 kg/m   General appearance: alert, no distress, well developed, well nourished Abdomen: +bs, soft, non tender, non distended, no masses, no hepatomegaly, no splenomegaly Back: nontender GU: External genitalia with some erythema of the vulva and slight white discharge, no other abnormality, no adnexal mass or lesion. Exam chaperoned by nurse    Assessment: Encounter Diagnoses  Name Primary?   Urine frequency Yes   Vaginal itching    Frequent headaches      Plan: Wet prep shows yeast.  Begin medication below Diflucan.  No obvious signs of UTI today.  Urinalysis normal.  If not much improved within 1 week then call back   Gladyes was seen today for uti.  Diagnoses and  all orders for this visit:  Urine frequency -     POCT Urinalysis DIP (Proadvantage Device) -     POCT Wet Prep [facility])  Vaginal itching -     POCT Wet Prep [facility])  Frequent headaches  Other orders -     fluconazole (DIFLUCAN) 150 MG tablet; Take 1 tablet (150 mg total) by mouth once for 1 dose. May repeat weekly   Follow up: Follow-up regarding headaches

## 2020-11-14 NOTE — Progress Notes (Signed)
Subjective:  Linda Chambers is a 25 y.o. female who presents for Chief Complaint  Patient presents with   migraines    Migraines- worse migraines now. 2-3 times a week. Using excerin migraine. Using ice compression on headache, no blurred vision.      Here for concern for migraine.  She has history of migraines all the way back to childhood.  She had a lot of migraines early on in life but then notes a period of time where they were not too bad.  In the past year she has had more frequent headaches.  She gets migraines on average 3 times per week.  They can last 1 to 2 days.  She does get nausea, photophobia.  She denies numbness tingling weakness.  No vision change, no hearing changes, no fall, no confusion.  She will use Tylenol or Excedrin when the migraines come on.  She has never used a triptan although Maxalt was prescribed for acute headache recently through the urgent care.  She never took this.  She notes that she has never been on daily preventative medication or other preventative medication for migraines.  She has sleep apnea and uses her CPAP every single day.  She uses [facility] for home health  Her paternal grandmother had a history of migraines  She denies fall, head injury, no current sleep problems.  She is not sure about the trigger for her migraines.  She has asthma but no current problems with asthma or allergies.  No teeth pain or dental issues.  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    No other c/o.  Current Outpatient Medications on File Prior to Visit  Medication Sig Dispense Refill   albuterol (VENTOLIN HFA) 108 (90 Base) MCG/ACT inhaler Inhale 2 puffs into the lungs every 6 (six) hours as needed for wheezing or shortness of breath. 8 g 0   cetirizine (ZYRTEC) 10 MG tablet Take 1 tablet by mouth at bedtime.     Cyanocobalamin (VITAMIN B-12 PO) Take 1 tablet by mouth at bedtime.      escitalopram (LEXAPRO) 10 MG tablet Take 10 mg by mouth daily.     icosapent Ethyl (VASCEPA) 1 g capsule Take 2 capsules (2 g total) by mouth 2 (two) times daily. 120 capsule 5   Lurasidone HCl 60 MG TABS Take 60 mg by mouth at bedtime.     Multiple Vitamin (MULTIVITAMIN) tablet Take 1 tablet by mouth at bedtime.     No current facility-administered medications on file prior to visit.   Past Medical History:  Diagnosis Date   Allergic rhinitis    Anxiety    Asthma    ?excercise only   Bipolar 1 disorder (HCC)    Hypercholesteremia    IBS (irritable bowel syndrome)    OSA (obstructive sleep apnea) 02/02/2019   Ovarian cyst    Family History  Problem Relation Age of Onset   Bipolar disorder Mother    Hypertension Father    Bipolar disorder Maternal Grandfather      The following portions of the patient's history were reviewed and updated as appropriate: allergies, current medications, past family history, past medical history, past social history, past surgical history and problem list.  ROS Otherwise as in subjective above  Objective: BP 120/70   Pulse 89   Wt 247 lb 3.2 oz (112.1 kg)   BMI 42.43 kg/m   General appearance:  alert, no distress, well developed, well nourished HEENT: normocephalic, sclerae anicteric, conjunctiva pink and moist, TMs pearly, nares patent, no discharge or erythema, pharynx normal Oral cavity: MMM, no lesions Neck: supple, no lymphadenopathy, no thyromegaly, no masses, no bruits Heart: RRR, normal S1, S2, no murmurs Lungs: CTA bilaterally, no wheezes, rhonchi, or rales Pulses: 2+ radial pulses, 2+ pedal pulses, normal cap refill Ext:no edema Neuro: CN II through XII intact, nonfocal exam Psych: Pleasant, answers questions appropriately    Assessment: Encounter Diagnoses  Name Primary?   Chronic migraine without aura without status migrainosus, not intractable Yes   OSA on CPAP    Mild intermittent asthma without complication     Allergic rhinitis, unspecified seasonality, unspecified trigger    Mayer-Rokitansky-Kuster-Hauser syndrome      Plan: Chronic migraine-we discussed her migraine pattern.  We discussed possible triggers.  She has underlying asthma but it is controlled.  She is compliant with CPAP for sleep sleep apnea.  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  Labs today as below.  I reviewed other comprehensive labs in the chart record from last few months  Pending labs we will begin a trial of medication.  We discussed several medication options including amitriptyline, Topamax, beta-blocker, Aimovig and other.  We discussed abortive therapy for acute migraine.  We discussed Imitrex, Maxalt, risk and benefits and proper use of medication.  She can certainly start with Tylenol or Excedrin over-the-counter if the migraine is mild.  We discussed clinical study/trial if 1 is available locally  Therapy will depend on labs and whether there is a drug study available versus one of the medicines we discussed.  Reduce stress where possible.  Avoid potential food triggers.  [name] was seen today for migraines.  Diagnoses and all orders for this visit:  Chronic migraine without aura without status migrainosus, not intractable -     TSH + free T4 -     Iron -     Prolactin  OSA on CPAP  Mild intermittent asthma without complication  Allergic rhinitis, unspecified seasonality, unspecified trigger  Mayer-Rokitansky-Kuster-Hauser syndrome  Other orders -     rizatriptan (MAXALT-MLT) 10 MG disintegrating tablet; Take 1 tablet (10 mg total) by mouth as needed for migraine. -     ondansetron (ZOFRAN ODT) 4 MG disintegrating tablet; Take 1 tablet (4 mg total) by mouth every 8 (eight) hours as needed for nausea or vomiting.   Follow up: Pending labs

## 2020-11-15 ENCOUNTER — Other Ambulatory Visit: Payer: Self-pay | Admitting: Medical

## 2020-11-15 DIAGNOSIS — R519 Headache, unspecified: Secondary | ICD-10-CM

## 2020-11-15 DIAGNOSIS — R7989 Other specified abnormal findings of blood chemistry: Secondary | ICD-10-CM

## 2020-11-15 LAB — PROLACTIN: Prolactin: 39 ng/mL — ABNORMAL HIGH (ref 4.8–23.3)

## 2020-11-15 LAB — TSH+FREE T4
Free T4: 0.91 ng/dL (ref 0.82–1.77)
TSH: 1.42 u[IU]/mL (ref 0.450–4.500)

## 2020-11-15 LAB — IRON: Iron: 36 ug/dL (ref 27–159)

## 2020-11-15 MED ORDER — ICOSAPENT ETHYL 1 G PO CAPS: 2.0000 g | ORAL_CAPSULE | Freq: Two times a day (BID) | ORAL | 11 refills | Status: DC

## 2020-11-19 ENCOUNTER — Other Ambulatory Visit: Payer: BLUE CROSS/BLUE SHIELD

## 2020-11-21 ENCOUNTER — Telehealth: Payer: Self-pay

## 2020-11-21 NOTE — Telephone Encounter (Signed)
A representative at [facility] called stating that they had to cancel the pts. Apt for an MRI because they do not take her ins. There. She has [place] and the pt. Requested if she could get it done somewhere else that excepts her ins.

## 2020-11-22 NOTE — Telephone Encounter (Signed)
Lvm with auth department of [facility].

## 2020-11-23 ENCOUNTER — Other Ambulatory Visit: Payer: BLUE CROSS/BLUE SHIELD

## 2020-11-26 NOTE — Telephone Encounter (Signed)
Sent over to referrals. And sent imaging places that pt can go to . KH

## 2020-12-03 ENCOUNTER — Other Ambulatory Visit: Payer: Self-pay | Admitting: Medical

## 2020-12-10 ENCOUNTER — Telehealth: Payer: Self-pay | Admitting: Internal Medicine

## 2020-12-10 NOTE — Telephone Encounter (Signed)
Patient Called and wants to know the results of her MRI. Here is the results that were in care everywhere since she had it done at [facility]. Please advise   Procedure Note  Ardyth Man, MD - 12/06/2020  Formatting of this note might be different from the original.  INDICATION: Other specified abnormal findings of blood chemistry. Migraines, nausea, neck and upper back pain. Elevated prolactin levels.   STUDY: MRI of the head without intravenous contrast performed on 12/05/2020 5:11 PM.   COMPARISON: None.   TECHNIQUE: Multiplanar, multisequence MR imaging of the brain was obtained without IV contrast.   Contrast: None.   FINDINGS:  #  Skull/marrow/soft tissues: Unremarkable.  #  Orbits: Unremarkable.  #  Sinuses/mastoid air cells: Frothy secretions with an air-fluid level in the sphenoid sinus. Rest of the paranasal sinuses are clear. Tiny RIGHT mastoid effusion.  #  Vessels: Normal flow voids within the major intracranial vessels.  #  Brain: The parenchyma, sulci, cisterns and ventricles are unremarkable. No acute infarction is identified. There is no extra-axial fluid collection or intracranial hemorrhage. There is no mass effect or midline shift.  #  Additional comments: None.    IMPRESSION:  1.  No acute intracranial abnormality.  2.  Frothy secretions with air-fluid level in the sphenoid sinus, recommend correlation for acute sinusitis.

## 2020-12-11 NOTE — Telephone Encounter (Signed)
Pt was notified of results. Pt states that is fine to do the 3 recommendations but wants to know can it be due to her prolactin level being elevated. She is still having severe migraines and have gotten worse. Please advise

## 2020-12-11 NOTE — Telephone Encounter (Signed)
Pt is coming in tomorrow to discuss this 

## 2020-12-12 ENCOUNTER — Other Ambulatory Visit: Payer: Self-pay

## 2020-12-12 ENCOUNTER — Ambulatory Visit (INDEPENDENT_AMBULATORY_CARE_PROVIDER_SITE_OTHER): Payer: BLUE CROSS/BLUE SHIELD | Admitting: Medical

## 2020-12-12 VITALS — BP 110/70 | HR 76 | Wt 255.2 lb

## 2020-12-12 DIAGNOSIS — Q528 Other specified congenital malformations of female genitalia: Secondary | ICD-10-CM | POA: Diagnosis not present

## 2020-12-12 DIAGNOSIS — Z79899 Other long term (current) drug therapy: Secondary | ICD-10-CM

## 2020-12-12 DIAGNOSIS — G43701 Chronic migraine without aura, not intractable, with status migrainosus: Secondary | ICD-10-CM | POA: Insufficient documentation

## 2020-12-12 DIAGNOSIS — J323 Chronic sphenoidal sinusitis: Secondary | ICD-10-CM | POA: Insufficient documentation

## 2020-12-12 DIAGNOSIS — R7301 Impaired fasting glucose: Secondary | ICD-10-CM | POA: Insufficient documentation

## 2020-12-12 DIAGNOSIS — J309 Allergic rhinitis, unspecified: Secondary | ICD-10-CM

## 2020-12-12 DIAGNOSIS — G4733 Obstructive sleep apnea (adult) (pediatric): Secondary | ICD-10-CM

## 2020-12-12 DIAGNOSIS — T50905A Adverse effect of unspecified drugs, medicaments and biological substances, initial encounter: Secondary | ICD-10-CM

## 2020-12-12 DIAGNOSIS — E221 Hyperprolactinemia: Secondary | ICD-10-CM

## 2020-12-12 DIAGNOSIS — F319 Bipolar disorder, unspecified: Secondary | ICD-10-CM

## 2020-12-12 DIAGNOSIS — F419 Anxiety disorder, unspecified: Secondary | ICD-10-CM

## 2020-12-12 MED ORDER — FLUCONAZOLE 150 MG PO TABS: 150.0000 mg | ORAL_TABLET | ORAL | 0 refills | Status: DC

## 2020-12-12 MED ORDER — CEFUROXIME AXETIL 500 MG PO TABS: 500.0000 mg | ORAL_TABLET | Freq: Two times a day (BID) | ORAL | 0 refills | Status: AC

## 2020-12-12 MED ORDER — PREDNISONE 10 MG PO TABS: ORAL_TABLET | ORAL | 0 refills | Status: DC

## 2020-12-12 NOTE — Progress Notes (Signed)
Subjective:  Linda Chambers is a 25 y.o. female who presents for Chief Complaint  Patient presents with   follow-up    Follow-up on migraines. Prolactin level elevated last time. 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    Here for recheck on migraines, recent MRI, elevated prolactin. I saw her on August 3 for concern for migraine. She has history of migraines all the way back to childhood.  She had a lot of migraines early on in life but then notes a period of time where they were not too bad.  In the past year she has had more frequent headaches.  She gets migraines on average 3 times per week.  They can last 1 to 2 days.  She does get nausea, photophobia.  She denies numbness tingling weakness.  No vision change, no hearing changes, no fall, no confusion.  Prior to last visit she had never been on preventative medication or prescription abortive therapy.  She was primarily using Tylenol or Excedrin.  Since last visit she has used Maxalt which helps a good amount but does not take the headache completely away.  She is not sure about the cause of her migraines.  Her only notable factors is that she is under stress.  She is in an internship for social work, she has school work and homework.  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  She does this 1 to 2 days/week but that can be stressful since the family has 5 children.  She has sleep apnea and uses her CPAP every single day.  She uses [facility] for home health  She has a history of exercise-induced asthma and allergies.  She does not recall any recent problems with allergies or asthma.  She does have some post nasal drainage, but  no sore throat, no cough, no major congestion, no teeth pain or dental issues.  Yesterday her psychiatrist, Dr. Caryn Section called me and spoke about her elevated prolactin and medications.  The assumption is that her psychiatric medications are causing her elevated prolactin level.  Her psychiatrist is going to work to change to a different regimen.  She has been very stable on [place] since age 71 so she really does not want to have to change her psychiatric medications but with the elevated prolactin she is going to attempt to modify her regimen.    No other aggravating or relieving factors.    No other c/o.  Past Medical History:  Diagnosis Date   Allergic rhinitis    Anxiety    Asthma    ?excercise only   Bipolar 1 disorder (HCC)    Hypercholesteremia    IBS (irritable bowel syndrome)    OSA (obstructive sleep apnea) 02/02/2019   Ovarian cyst      Current Outpatient Medications on File Prior to Visit  Medication Sig Dispense Refill   albuterol (VENTOLIN HFA) 108 (90 Base) MCG/ACT inhaler TAKE 2 PUFFS BY MOUTH EVERY 6 HOURS AS NEEDED FOR WHEEZE OR SHORTNESS OF BREATH 8.5 each 1   cetirizine (ZYRTEC) 10 MG tablet Take 1 tablet by mouth at bedtime.     Cyanocobalamin (VITAMIN B-12 PO) Take 1 tablet by mouth at bedtime.     escitalopram (LEXAPRO) 10  MG tablet Take 10 mg by mouth daily.     icosapent Ethyl (VASCEPA) 1 g capsule Take 2 capsules (2 g total) by mouth 2 (two) times daily. 120 capsule 11   Lurasidone HCl 60 MG TABS Take 60 mg by mouth at bedtime.     Multiple Vitamin (MULTIVITAMIN) tablet Take 1 tablet by mouth at bedtime.     ondansetron (ZOFRAN ODT) 4 MG disintegrating tablet Take 1 tablet (4 mg total) by mouth every 8 (eight) hours as needed for nausea or vomiting. 20 tablet 0   rizatriptan (MAXALT-MLT) 10 MG disintegrating tablet Take 1 tablet (10 mg total) by mouth as needed for migraine. 10 tablet 1   JUNEL FE 1/20 1-20 MG-MCG tablet Take 1 tablet by mouth daily.      VRAYLAR 1.5 MG capsule Take 1.5 mg by mouth daily.     No current facility-administered medications on file prior to visit.    Family History  Problem Relation Age of Onset   Bipolar disorder Mother    Hypertension Father    Bipolar disorder Maternal Grandfather      The following portions of the patient's history were reviewed and updated as appropriate: allergies, current medications, past family history, past medical history, past social history, past surgical history and problem list.  ROS Otherwise as in subjective above  Objective: BP 110/70   Pulse 76   Wt 255 lb 3.2 oz (115.8 kg)   BMI 43.80 kg/m   General appearance: alert, no distress, well developed, well nourished HEENT: normocephalic, sclerae anicteric, conjunctiva pink and moist, TMs pearly, nares patent, no discharge or erythema, pharynx normal Oral cavity: MMM, no lesions Neck: supple, no lymphadenopathy, no thyromegaly, no masses, no bruits Heart: RRR, normal S1, S2, no murmurs Lungs: CTA bilaterally, no wheezes, rhonchi, or rales Pulses: 2+ radial pulses, 2+ pedal pulses, normal cap refill Ext:no edema Neuro: CN II through XII intact, nonfocal exam Psych: Pleasant, answers questions appropriately    Assessment: Encounter Diagnoses  Name Primary?   Chronic migraine without aura with status migrainosus, not intractable Yes   Bipolar 1 disorder (HCC)    OSA (obstructive sleep apnea)    Mayer-Rokitansky-Kuster-Hauser syndrome    Allergic rhinitis, unspecified seasonality, unspecified trigger    Anxiety    Chronic sphenoidal sinusitis    High risk medication use    Hyperprolactinemia (HCC)    Adverse effect of drug, initial encounter    Impaired fasting blood sugar       Plan: We reviewed her MRI brain that was done on December 05, 2020. Results showed no acute intracranial abnormality, but there was frothy secretions with air-fluid levels in the sphenoid sinus.  There were no signs of mass,  normal ventricles, normal orbits, no bony lesions, vessels normal.  Chronic migraine-we discussed her migraine pattern.  We discussed possible triggers.  She has underlying asthma but it is controlled.  She is compliant with CPAP for sleep sleep apnea.  I reviewed a recent compliance report which shows 100% compliant  I suspect stress is the biggest trigger right now since she is in school full-time with an internship.  I reviewed recent comprehensive labs in the chart record from last few months  I sent an electronic E consult for specialty provider review through [facility] on their behalf regarding migrianes, medicatinos.  I will get back in touch with patient pending recommendations from the [facility] consult.  Pending consult feedback, we will begin a trial of medication.  We  discussed several medication options including amitriptyline, Topamax, beta-blocker, Aimovig and other.  We discussed abortive therapy for acute migraine.  We discussed Imitrex, Maxalt, risk and benefits and proper use of medication.  She can certainly start with Tylenol or Excedrin over-the-counter if the migraine is mild.  Elevated prolactin-normal MRI brain, no pituitary lesion.  Most likely the prolactin is coming from side effect of medication Latuda.  Her psychiatrist is working to change her regimen.  I sent an electronic E consult for specialty provider review through [facility] on their behalf regarding prolactin,medications, overall guidance.  I will get back in touch with patient pending recommendations from the [facility] consult.  Sleep apnea-continue CPAP  Work on losing weight through healthy diet and exercise  Exercise-induced asthma-no recent concerns  Allergies-no recent concerns  Given the chronic sinusitis listed on MRI we will go ahead and do a round of treatment with prednisone and an antibiotic below.  I gave Diflucan in the event of secondary yeast infection.  Of note I have treated her for  sinus infection within the last several months with amoxicillin and she did get secondary yeast infection with that round of treatment  Impaired fasting glucose - needs to work on healthy diet, exercise, and weight loss.  Korah was seen today for follow-up.  Diagnoses and all orders for this visit:  Chronic migraine without aura with status migrainosus, not intractable  Bipolar 1 disorder (HCC)  OSA (obstructive sleep apnea)  Mayer-Rokitansky-Kuster-Hauser syndrome  Allergic rhinitis, unspecified seasonality, unspecified trigger  Anxiety  Chronic sphenoidal sinusitis  High risk medication use  Hyperprolactinemia (HCC)  Adverse effect of drug, initial encounter  Impaired fasting blood sugar  Other orders -     predniSONE (DELTASONE) 10 MG tablet; 6 tablets all together day 1, 5 tablets day 2, 4 tablets day 3, 3 tablets day 4, 2 tablets day 5, 1 tablet day 6. -     fluconazole (DIFLUCAN) 150 MG tablet; Take 1 tablet (150 mg total) by mouth once a week. -     cefUROXime (CEFTIN) 500 MG tablet; Take 1 tablet (500 mg total) by mouth 2 (two) times daily with a meal for 10 days.    Follow up: 6-8 weeks with PCP Hetty Blend NP here

## 2020-12-13 ENCOUNTER — Other Ambulatory Visit: Payer: Self-pay | Admitting: Medical

## 2020-12-13 ENCOUNTER — Telehealth: Payer: Self-pay | Admitting: Medical

## 2020-12-13 MED ORDER — EMGALITY 120 MG/ML ~~LOC~~ SOAJ: 120.0000 mg | SUBCUTANEOUS | 1 refills | Status: DC

## 2020-12-13 MED ORDER — NURTEC 75 MG PO TBDP: 1.0000 | ORAL_TABLET | Freq: Every day | ORAL | 2 refills | Status: DC | PRN

## 2020-12-13 NOTE — Telephone Encounter (Signed)
Pt has been switched to vraylar already and would like to do MRI. Wants to start on migraine shot. She has has headache everyday the last 2 weeks and is tired of it.  Per shane on phone options 1- start vryalar and do MRI if they can get it covered as well as sample of emgality             Option 2- start emgality and see how it does for a month or so then recheck prolactin level, if normal no need to do MRI   Waiting for patient to return my call

## 2020-12-13 NOTE — Telephone Encounter (Signed)
I consulted with a neurologist.  They recommended we try the injection monthly for migraine prevention instead of one of the oral medications we talked about.  The issue will be whether or not this is covered by insurance.  So lets find out.  They also recommended something called Nurtec for abortive therapy instead of the Maxalt.  I sent this to the pharmacy.  This can be used daily once as needed for migraine treatment.  Again if this is not covered by insurance let me know right away.  Finish out the prednisone and antibiotic  Other thoughts-I also spoke to radiologist that looked at your MRI as well as endocrinology.  There apparently is a more specific pituitary MRI that would see even smaller lesions.  The MRI you had only goes down to about a 1 cm lesion so they cannot rule out with 100% certainty any issue with the pituitary but they did not see anything greater than 1 cm in size.  Thus-before I do ordering another MRI I would recommend referring you to endocrinology and letting them address this issue, or we can recheck the prolactin once you have had a medication change by your psychiatrist.  We could check the prolactin lets say a month or 2 after changing to a different medication other than Latuda  Conversely, one of the specialist I spoke to advised that as long as you were doing well on the Latuda for so long, if we can prove without a doubt there is no mass on the MRI they recommended you stay on the Latuda  Obviously these are 2 different perspectives  So it is up to you on what we do next.  We can either try for a dedicated pituitary MRI and see if insurance will cover this or you could potentially stay on Latuda, or you can move forward with potential change in medication and [place] monitor the prolactin levels over the next 2 months

## 2020-12-13 NOTE — Telephone Encounter (Signed)
Patient is going to go with switching to vryavlar which she already has, wants to come pick up emgality tomorrow for a sample and also wants to go ahead and do MRI just to make sure nothing is going in. She will have to do this at [facility] so put in external order for location as it can't be done at [facility] since last MRI was not approved through [facility]

## 2020-12-15 ENCOUNTER — Telehealth: Payer: Self-pay

## 2020-12-15 NOTE — Telephone Encounter (Signed)
P.A. approved sent pt my chart message

## 2020-12-15 NOTE — Telephone Encounter (Signed)
P.A. APPROVED sent pt mychart message

## 2020-12-15 NOTE — Telephone Encounter (Signed)
P.A. EMGALITY  

## 2020-12-15 NOTE — Telephone Encounter (Signed)
P.A. NURTEC  

## 2020-12-17 ENCOUNTER — Other Ambulatory Visit: Payer: Self-pay | Admitting: Medical

## 2020-12-18 NOTE — Telephone Encounter (Signed)
Pt is aware that it has been approved and I have faxed over order so they should contact pt to schedule

## 2020-12-18 NOTE — Telephone Encounter (Signed)
I have faxed over information for approval for MRI to [facility]. Approval [id number] valid 12/18/20-02/16/21. Reference Number is [id number]. CPT [id number]

## 2020-12-18 NOTE — Telephone Encounter (Signed)
I have contact kate with [facility] to see if I can get a CPT to be able to do a prior auth. Waiting on a call back .

## 2020-12-19 ENCOUNTER — Other Ambulatory Visit: Payer: Self-pay

## 2020-12-19 ENCOUNTER — Telehealth: Payer: Self-pay

## 2020-12-19 MED ORDER — ICOSAPENT ETHYL 1 G PO CAPS: 2.0000 g | ORAL_CAPSULE | Freq: Two times a day (BID) | ORAL | 2 refills | Status: DC

## 2020-12-19 NOTE — Telephone Encounter (Signed)
Recv'd fax from [facility] insurance requires 90 day Rx for Vascepa

## 2020-12-20 ENCOUNTER — Encounter: Payer: Self-pay | Admitting: Internal Medicine

## 2020-12-25 NOTE — Telephone Encounter (Signed)
done

## 2021-01-01 ENCOUNTER — Telehealth: Payer: Self-pay

## 2021-01-01 NOTE — Telephone Encounter (Signed)
Pt calling; is in pretty significant pelvic pain; has hx of ov cysts; should she be seen in office or go to ED.  [phone]  Mailbox is full.

## 2021-01-02 NOTE — Telephone Encounter (Signed)
Pt calling back from speaking c front desk; there is no availability for a work in appt for 2 wks; can't see that she can afford to go to the ED.  [phone]  Mailbox is full.

## 2021-01-02 NOTE — Telephone Encounter (Signed)
Spoke c pt; adv if pain is so bad she cannot walk or talk through it/it doubles her over, she should go to ED.  May take IBU 600mg  q6hrs or 800mg  q8hrs; apply heat /ice 20 minutes each hour while awake.

## 2021-01-07 ENCOUNTER — Other Ambulatory Visit: Payer: Self-pay | Admitting: Medical

## 2021-01-14 ENCOUNTER — Ambulatory Visit: Payer: BLUE CROSS/BLUE SHIELD | Admitting: Obstetrics & Gynecology

## 2021-01-15 ENCOUNTER — Encounter: Payer: Self-pay | Admitting: Obstetrics & Gynecology

## 2021-01-15 ENCOUNTER — Other Ambulatory Visit: Payer: Self-pay

## 2021-01-15 ENCOUNTER — Ambulatory Visit (INDEPENDENT_AMBULATORY_CARE_PROVIDER_SITE_OTHER): Payer: BLUE CROSS/BLUE SHIELD | Admitting: Obstetrics & Gynecology

## 2021-01-15 ENCOUNTER — Ambulatory Visit: Payer: BLUE CROSS/BLUE SHIELD | Admitting: Obstetrics & Gynecology

## 2021-01-15 VITALS — BP 130/90 | Ht 64.0 in | Wt 254.0 lb

## 2021-01-15 DIAGNOSIS — Q8789 Other specified congenital malformation syndromes, not elsewhere classified: Secondary | ICD-10-CM | POA: Diagnosis not present

## 2021-01-15 DIAGNOSIS — R109 Unspecified abdominal pain: Secondary | ICD-10-CM

## 2021-01-15 DIAGNOSIS — E282 Polycystic ovarian syndrome: Secondary | ICD-10-CM

## 2021-01-15 DIAGNOSIS — Z8742 Personal history of other diseases of the female genital tract: Secondary | ICD-10-CM

## 2021-01-15 DIAGNOSIS — Q528 Other specified congenital malformations of female genitalia: Secondary | ICD-10-CM

## 2021-01-15 NOTE — Patient Instructions (Signed)
Semaglutide Injection What is this medication? SEMAGLUTIDE (SEM a GLOO tide) treats type 2 diabetes. It works by increasing insulin levels in your body, which decreases your blood sugar (glucose). It also reduces the amount of sugar released into the blood and slows down your digestion. It can also be used to lower the risk of heart attack and stroke in people with type 2 diabetes. Changes to diet and exercise are often combined with this medication. This medicine may be used for other purposes; ask your health care provider or pharmacist if you have questions. COMMON BRAND NAME(S): OZEMPIC What should I tell my care team before I take this medication? They need to know if you have any of these conditions: Endocrine tumors (MEN 2) or if someone in your family had these tumors Eye disease, vision problems History of pancreatitis Kidney disease Stomach problems Thyroid cancer or if someone in your family had thyroid cancer An unusual or allergic reaction to semaglutide, other medications, foods, dyes, or preservatives Pregnant or trying to get pregnant Breast-feeding How should I use this medication? This medication is for injection under the skin of your upper leg (thigh), stomach area, or upper arm. It is given once every week (every 7 days). You will be taught how to prepare and give this medication. Use exactly as directed. Take your medication at regular intervals. Do not take it more often than directed. If you use this medication with insulin, you should inject this medication and the insulin separately. Do not mix them together. Do not give the injections right next to each other. Change (rotate) injection sites with each injection. It is important that you put your used needles and syringes in a special sharps container. Do not put them in a trash can. If you do not have a sharps container, call your pharmacist or care team to get one. A special MedGuide will be given to you by the  pharmacist with each prescription and refill. Be sure to read this information carefully each time. This medication comes with INSTRUCTIONS FOR USE. Ask your pharmacist for directions on how to use this medication. Read the information carefully. Talk to your pharmacist or care team if you have questions. Talk to your care team about the use of this medication in children. Special care may be needed. Overdosage: If you think you have taken too much of this medicine contact a poison control center or emergency room at once. NOTE: This medicine is only for you. Do not share this medicine with others. What if I miss a dose? If you miss a dose, take it as soon as you can within 5 days after the missed dose. Then take your next dose at your regular weekly time. If it has been longer than 5 days after the missed dose, do not take the missed dose. Take the next dose at your regular time. Do not take double or extra doses. If you have questions about a missed dose, contact your care team for advice. What may interact with this medication? Other medications for diabetes Many medications may cause changes in blood sugar, these include: Alcohol containing beverages Antiviral medications for HIV or AIDS Aspirin and aspirin-like medications Certain medications for blood pressure, heart disease, irregular heart beat Chromium Diuretics Female hormones, such as estrogens or progestins, birth control pills Fenofibrate Gemfibrozil Isoniazid Lanreotide Female hormones or anabolic steroids MAOIs like Carbex, Eldepryl, Marplan, Nardil, and Parnate Medications for weight loss Medications for allergies, asthma, cold, or cough Medications for depression,   anxiety, or psychotic disturbances Niacin Nicotine NSAIDs, medications for pain and inflammation, like ibuprofen or naproxen Octreotide Pasireotide Pentamidine Phenytoin Probenecid Quinolone antibiotics such as ciprofloxacin, levofloxacin, ofloxacin Some  herbal dietary supplements Steroid medications such as prednisone or cortisone Sulfamethoxazole; trimethoprim Thyroid hormones Some medications can hide the warning symptoms of low blood sugar (hypoglycemia). You may need to monitor your blood sugar more closely if you are taking one of these medications. These include: Beta-blockers, often used for high blood pressure or heart problems (examples include atenolol, metoprolol, propranolol) Clonidine Guanethidine Reserpine This list may not describe all possible interactions. Give your health care provider a list of all the medicines, herbs, non-prescription drugs, or dietary supplements you use. Also tell them if you smoke, drink alcohol, or use illegal drugs. Some items may interact with your medicine. What should I watch for while using this medication? Visit your care team for regular checks on your progress. Drink plenty of fluids while taking this medication. Check with your care team if you get an attack of severe diarrhea, nausea, and vomiting. The loss of too much body fluid can make it dangerous for you to take this medication. A test called the HbA1C (A1C) will be monitored. This is a simple blood test. It measures your blood sugar control over the last 2 to 3 months. You will receive this test every 3 to 6 months. Learn how to check your blood sugar. Learn the symptoms of low and high blood sugar and how to manage them. Always carry a quick-source of sugar with you in case you have symptoms of low blood sugar. Examples include hard sugar candy or glucose tablets. Make sure others know that you can choke if you eat or drink when you develop serious symptoms of low blood sugar, such as seizures or unconsciousness. They must get medical help at once. Tell your care team if you have high blood sugar. You might need to change the dose of your medication. If you are sick or exercising more than usual, you might need to change the dose of your  medication. Do not skip meals. Ask your care team if you should avoid alcohol. Many nonprescription cough and cold products contain sugar or alcohol. These can affect blood sugar. Pens should never be shared. Even if the needle is changed, sharing may result in passing of viruses like hepatitis or HIV. Wear a medical ID bracelet or chain, and carry a card that describes your disease and details of your medication and dosage times. Do not become pregnant while taking this medication. Women should inform their care team if they wish to become pregnant or think they might be pregnant. There is a potential for serious side effects to an unborn child. Talk to your care team for more information. What side effects may I notice from receiving this medication? Side effects that you should report to your care team as soon as possible: Allergic reactions-skin rash, itching, hives, swelling of the face, lips, tongue, or throat Change in vision Dehydration-increased thirst, dry mouth, feeling faint or lightheaded, headache, dark yellow or brown urine Gallbladder problems-severe stomach pain, nausea, vomiting, fever Heart palpitations-rapid, pounding, or irregular heartbeat Kidney injury-decrease in the amount of urine, swelling of the ankles, hands, or feet Pancreatitis-severe stomach pain that spreads to your back or gets worse after eating or when touched, fever, nausea, vomiting Thyroid cancer-new mass or lump in the neck, pain or trouble swallowing, trouble breathing, hoarseness Side effects that usually do not require medical   attention (report to your care team if they continue or are bothersome): Diarrhea Loss of appetite Nausea Stomach pain Vomiting This list may not describe all possible side effects. Call your doctor for medical advice about side effects. You may report side effects to FDA at 1-800-FDA-1088. Where should I keep my medication? Keep out of the reach of children. Store unopened  pens in a refrigerator between 2 and 8 degrees C (36 and 46 degrees F). Do not freeze. Protect from light and heat. After you first use the pen, it can be stored for 56 days at room temperature between 15 and 30 degrees C (59 and 86 degrees F) or in a refrigerator. Throw away your used pen after 56 days or after the expiration date, whichever comes first. Do not store your pen with the needle attached. If the needle is left on, medication may leak from the pen. NOTE: This sheet is a summary. It may not cover all possible information. If you have questions about this medicine, talk to your doctor, pharmacist, or health care provider.  2022 Elsevier/Gold Standard (2020-05-28 16:01:53)  

## 2021-01-15 NOTE — Progress Notes (Signed)
Gynecology Pelvic Pain Evaluation   Chief Complaint  Patient presents with   Ovarian Cyst Also, Hormonal Changes   History of Present Illness:   Patient is a 25 y.o. G0 with No LMP recorded. 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  She complains of  1.  Episode again of lower abdominal pain; 2. Weight gain and hormonal changes .   Her pain is localized to the RLQ area, described as intermittent, sharp, stabbing, and can be intermittent with left lower quadrant, and occurs once every 5-6 mos, lasts for about a week , most recent episode began several weeks ago and its severity is described as severe. The pain radiates to the  Non-radiating. She has these associated symptoms which include none. Patient has these modifiers which include relaxation and pain medication that make it better and unable to associate with any factor that make it worse.  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  Prior scans of recent note did not reveal cysts. Pt reports weight gain over the last year, despite diet and exercise.  She is having investigation due to high prolactin level, due for brain MRI.  She requests insulin resistence testing  PMHx: She  has a past medical history of Allergic rhinitis, Anxiety, Asthma, Bipolar 1 disorder (HCC), Hypercholesteremia, IBS (irritable bowel syndrome), OSA (obstructive sleep apnea) (02/02/2019), and Ovarian cyst. Also,  has a past surgical history that includes Abdominal surgery; Wisdom tooth extraction (Bilateral); and Vaginoplasty., family history includes Bipolar disorder in her maternal grandfather and mother; Hypertension in her father.,  reports that she has never smoked. She has never used smokeless tobacco. She reports current alcohol use of about 1.0 standard drink per week. She reports current drug use.  She has a current medication list which includes the following prescription(s):  albuterol, cetirizine, cyanocobalamin, emgality, icosapent ethyl, multivitamin, ondansetron, nurtec, vraylar, escitalopram, fluconazole, junel fe 1/20, lurasidone hcl, prednisone, and rizatriptan. Also, has No Known Allergies.  Review of Systems  Constitutional:  Positive for malaise/fatigue. Negative for chills and fever.  HENT:  Negative for congestion, sinus pain and sore throat.   Eyes:  Negative for blurred vision and pain.  Respiratory:  Negative for cough and wheezing.   Cardiovascular:  Negative for chest pain and leg swelling.  Gastrointestinal:  Positive for diarrhea and nausea. Negative for abdominal pain, constipation, heartburn and vomiting.  Genitourinary:  Negative for dysuria, frequency, hematuria and urgency.  Musculoskeletal:  Negative for back pain, joint pain, myalgias and neck pain.  Skin:  Negative for itching and rash.  Neurological:  Positive for dizziness and headaches. Negative for tremors and weakness.  Endo/Heme/Allergies:  Does not bruise/bleed easily.  Psychiatric/Behavioral:  Negative for depression. The patient is nervous/anxious. The patient does not have insomnia.    Objective: BP 130/90   Ht 5\' 4"  (1.626 m)   Wt 254 lb (115.2 kg)   BMI 43.60 kg/m  Physical Exam Constitutional:      General: She is not in acute distress.    Appearance: She is well-developed.  Musculoskeletal:        General: Normal range of motion.  Neurological:     Mental Status: She is alert and oriented to person, place, and time.  Skin:    General: Skin is warm and dry.  Vitals reviewed.   Assessment: 25 y.o. G0 1. Abdominal pain in female None today.  Intermittent, c/w ovarian cyst Discussed cysts  and their typical transient pathophysiology. 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 Hx of ovarian cyst Monitor  4. PCOS (polycystic ovarian syndrome) Check levels. Consider Ozempic for weight gain and regulation  of prediabetes levels. - Insulin, random - Glucose, fasting - FSH/LH  Annamarie Major, MD, [facility], [facility] 01/15/2021  8:16 AM

## 2021-01-16 ENCOUNTER — Other Ambulatory Visit: Payer: Self-pay | Admitting: Obstetrics & Gynecology

## 2021-01-16 LAB — INSULIN, RANDOM: INSULIN: 59.1 u[IU]/mL — ABNORMAL HIGH (ref 2.6–24.9)

## 2021-01-16 LAB — GLUCOSE, FASTING: Glucose, Plasma: 96 mg/dL (ref 70–99)

## 2021-01-16 LAB — FSH/LH
FSH: 4.9 m[IU]/mL
LH: 8.3 m[IU]/mL

## 2021-01-16 MED ORDER — OZEMPIC (0.25 OR 0.5 MG/DOSE) 2 MG/1.5ML ~~LOC~~ SOPN: 0.5000 mg | PEN_INJECTOR | SUBCUTANEOUS | 3 refills | Status: DC

## 2021-01-27 ENCOUNTER — Other Ambulatory Visit: Payer: Self-pay

## 2021-01-27 DIAGNOSIS — R112 Nausea with vomiting, unspecified: Secondary | ICD-10-CM | POA: Insufficient documentation

## 2021-01-27 DIAGNOSIS — J45909 Unspecified asthma, uncomplicated: Secondary | ICD-10-CM | POA: Insufficient documentation

## 2021-01-27 DIAGNOSIS — R1084 Generalized abdominal pain: Secondary | ICD-10-CM | POA: Diagnosis not present

## 2021-01-27 DIAGNOSIS — T383X5A Adverse effect of insulin and oral hypoglycemic [antidiabetic] drugs, initial encounter: Secondary | ICD-10-CM | POA: Insufficient documentation

## 2021-01-27 LAB — COMPREHENSIVE METABOLIC PANEL
ALT: 30 U/L (ref 0–44)
AST: 27 U/L (ref 15–41)
Albumin: 4 g/dL (ref 3.5–5.0)
Alkaline Phosphatase: 75 U/L (ref 38–126)
Anion gap: 10 (ref 5–15)
BUN: 12 mg/dL (ref 6–20)
CO2: 27 mmol/L (ref 22–32)
Calcium: 9.2 mg/dL (ref 8.9–10.3)
Chloride: 99 mmol/L (ref 98–111)
Creatinine, Ser: 0.68 mg/dL (ref 0.44–1.00)
GFR, Estimated: >60 mL/min (ref >60)
Glucose, Bld: 103 mg/dL — ABNORMAL HIGH (ref 70–99)
Potassium: 3.9 mmol/L (ref 3.5–5.1)
Sodium: 136 mmol/L (ref 135–145)
Total Bilirubin: 0.8 mg/dL (ref 0.3–1.2)
Total Protein: 7.8 g/dL (ref 6.5–8.1)

## 2021-01-27 LAB — CBC
HCT: 42.7 % (ref 36.0–46.0)
Hemoglobin: 14.3 g/dL (ref 12.0–15.0)
MCH: 25.4 pg — ABNORMAL LOW (ref 26.0–34.0)
MCHC: 33.5 g/dL (ref 30.0–36.0)
MCV: 75.8 fL — ABNORMAL LOW (ref 80.0–100.0)
Platelets: 453 10*3/uL — ABNORMAL HIGH (ref 150–400)
RBC: 5.63 MIL/uL — ABNORMAL HIGH (ref 3.87–5.11)
RDW: 15 % (ref 11.5–15.5)
WBC: 8.4 10*3/uL (ref 4.0–10.5)
nRBC: 0 % (ref 0.0–0.2)

## 2021-01-27 LAB — LIPASE, BLOOD: Lipase: 24 U/L (ref 11–51)

## 2021-01-27 MED ORDER — ONDANSETRON 4 MG PO TBDP
4.0000 mg | ORAL_TABLET | Freq: Once | ORAL | Status: AC | PRN
  Administered 2021-01-27: 4 mg via ORAL

## 2021-01-27 MED ORDER — ONDANSETRON 4 MG PO TBDP
ORAL_TABLET | ORAL | Status: AC
  Filled 2021-01-27: qty 1

## 2021-01-27 NOTE — ED Notes (Signed)
Pt unable to urinate at this time. Pt given specimen container for when is able to do so.

## 2021-01-27 NOTE — ED Triage Notes (Signed)
Pt states generalized abd pain, nausea and vomiting today. Pt states she was started on ozympic. Pt appears in no acute distress, moist oral mucus membranes noted.

## 2021-01-28 ENCOUNTER — Emergency Department
Admission: EM | Admit: 2021-01-28 | Discharge: 2021-01-28 | Disposition: A | Discharge status: Home / Self Care | Payer: BLUE CROSS/BLUE SHIELD | Attending: Emergency Medicine | Admitting: Emergency Medicine

## 2021-01-28 DIAGNOSIS — T50905A Adverse effect of unspecified drugs, medicaments and biological substances, initial encounter: Secondary | ICD-10-CM

## 2021-01-28 DIAGNOSIS — R112 Nausea with vomiting, unspecified: Secondary | ICD-10-CM

## 2021-01-28 LAB — URINALYSIS, COMPLETE (UACMP) WITH MICROSCOPIC
Bilirubin Urine: NEGATIVE
Glucose, UA: NEGATIVE mg/dL
Hgb urine dipstick: NEGATIVE
Ketones, ur: 20 mg/dL — AB
Leukocytes,Ua: NEGATIVE
Nitrite: NEGATIVE
Protein, ur: 30 mg/dL — AB
Specific Gravity, Urine: 1.033 — ABNORMAL HIGH (ref 1.005–1.030)
pH: 6 (ref 5.0–8.0)

## 2021-01-28 MED ORDER — ONDANSETRON 4 MG PO TBDP: 4.0000 mg | ORAL_TABLET | Freq: Three times a day (TID) | ORAL | 0 refills | Status: DC | PRN

## 2021-01-28 NOTE — ED Notes (Signed)
Pt states improved nausea after zofran administration

## 2021-01-28 NOTE — ED Provider Notes (Signed)
[facility] Emergency Department Provider Note   ____________________________________________   Event Date/Time   First MD Initiated Contact with Patient 01/28/21 0203     (approximate)  I have reviewed the triage vital signs and the nursing notes.   HISTORY  Chief Complaint Abdominal Pain    HPI Linda Chambers is a 25 y.o. female with past medical history of bipolar disorder, IBS, MRKH syndrome, and PCOS who presents to the ED complaining of abdominal pain.  Patient reports that she was recently prescribed Ozempic by her OB/GYN, has taken 2 doses of this medication since starting it.  She states that shortly after starting it she began feeling nauseous and she has been having frequent vomiting over the past 24 to 48 hours.  She states it has been difficult for her to keep down either liquids or solids.  Since she has begun vomiting, she reports diffuse associated abdominal pain.  She denies any vaginal bleeding or discharge, was born without a uterus but does have functional ovaries due to MRKH syndrome.  She denies any dysuria, fever, or flank pain.  She reports feeling much better following dose of Zofran in triage.        Past Medical History:  Diagnosis Date   Allergic rhinitis    Anxiety    Asthma    ?excercise only   Bipolar 1 disorder (HCC)    Hypercholesteremia    IBS (irritable bowel syndrome)    OSA (obstructive sleep apnea) 02/02/2019   Ovarian cyst     Patient Active Problem List   Diagnosis Date Noted   Chronic migraine without aura with status migrainosus, not intractable 12/12/2020   Chronic sphenoidal sinusitis 12/12/2020   Drug reaction 12/12/2020   Hyperprolactinemia (HCC) 12/12/2020   High risk medication use 12/12/2020   Impaired fasting blood sugar 12/12/2020   Frequent headaches 11/13/2020   Vaginal itching 11/13/2020   Urine frequency 11/13/2020   Irritable bowel syndrome 10/18/2020   Elevated glucose 10/18/2020    Screening for diabetes mellitus 10/18/2020   Abdominal discomfort 10/18/2020   Screen for STD (sexually transmitted disease) 10/18/2020   Urinary tract infection without hematuria 10/18/2020   Nausea 10/18/2020   OSA (obstructive sleep apnea) 02/02/2019   Hx of ovarian cyst 05/21/2018   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   Hypertriglyceridemia 05/19/2018   Asthma    Bipolar 1 disorder (HCC)    Hypercholesteremia    Anxiety    Allergic rhinitis     Past Surgical History:  Procedure Laterality Date   ABDOMINAL SURGERY     VAGINOPLASTY     WISDOM TOOTH EXTRACTION Bilateral     Prior to Admission medications   Medication Sig Start Date End Date Taking? Authorizing Provider  ondansetron (ZOFRAN ODT) 4 MG disintegrating tablet Take 1 tablet (4 mg total) by mouth every 8 (eight) hours as needed for nausea or vomiting. 01/28/21  Yes Chesley Noon, MD  albuterol (VENTOLIN HFA) 108 (90 Base) MCG/ACT inhaler TAKE 2 PUFFS BY MOUTH EVERY 6 HOURS AS NEEDED FOR WHEEZE OR SHORTNESS OF BREATH 12/03/20   Tysinger, Kermit Balo, PA-C  cetirizine (ZYRTEC) 10 MG tablet Take 1 tablet by mouth at bedtime.    [provider]  Cyanocobalamin (VITAMIN B-12 PO) Take 1 tablet by mouth at bedtime.    [provider]  Galcanezumab-gnlm (EMGALITY) 120 MG/ML SOAJ Inject 120 mg into the skin every 30 (thirty) days. 12/13/20   Tysinger, Kermit Balo, PA-C  icosapent Ethyl (VASCEPA) 1 g capsule  Take 2 capsules (2 g total) by mouth 2 (two) times daily. 12/19/20   Henson, [name] L, PA-C  JUNEL FE 1/20 1-20 MG-MCG tablet Take 1 tablet by mouth daily. Patient not taking: Reported on 01/15/2021 12/04/20   [provider]  Multiple Vitamin (MULTIVITAMIN) tablet Take 1 tablet by mouth at bedtime.    [provider]  OZEMPIC, 0.25 OR 0.5 MG/DOSE, 2 MG/1.5ML SOPN Inject 0.5 mg into the skin once a week. 01/16/21   Nadara Mustard, MD  Rimegepant Sulfate (NURTEC) 75 MG TBDP Take 1 tablet  by mouth daily as needed. 12/13/20   Tysinger, Kermit Balo, PA-C  VRAYLAR 1.5 MG capsule Take 1.5 mg by mouth daily. 12/11/20   [provider]    Allergies Patient has no known allergies.  Family History  Problem Relation Age of Onset   Bipolar disorder Mother    Hypertension Father    Bipolar disorder Maternal Grandfather     Social History Social History   Tobacco Use   Smoking status: Never   Smokeless tobacco: Never  Substance Use Topics   Alcohol use: Yes    Alcohol/week: 1.0 standard drink    Types: 1 Glasses of wine per week    Comment: social events   Drug use: Yes    Comment: THC    Review of Systems  Constitutional: No fever/chills Eyes: No visual changes. ENT: No sore throat. Cardiovascular: Denies chest pain. Respiratory: Denies shortness of breath. Gastrointestinal: Positive for abdominal pain, nausea, and vomiting.  No diarrhea.  No constipation. Genitourinary: Negative for dysuria. Musculoskeletal: Negative for back pain. Skin: Negative for rash. Neurological: Negative for headaches, focal weakness or numbness.  ____________________________________________   PHYSICAL EXAM:  VITAL SIGNS: ED Triage Vitals [01/27/21 2201]  Enc Vitals Group     BP 138/82     Pulse Rate (!) 104     Resp 16     Temp 98 F (36.7 C)     Temp Source Oral     SpO2 100 %     Weight 250 lb (113.4 kg)     Height 5\' 4"  (1.626 m)     Head Circumference      Peak Flow      Pain Score 7     Pain Loc      Pain Edu?      Excl. in GC?     Constitutional: Alert and oriented. Eyes: Conjunctivae are normal. Head: Atraumatic. Nose: No congestion/rhinnorhea. Mouth/Throat: Mucous membranes are moist. Neck: Normal ROM Cardiovascular: Normal rate, regular rhythm. Grossly normal heart sounds.  2+ radial pulses bilaterally. Respiratory: Normal respiratory effort.  No retractions. Lungs CTAB. Gastrointestinal: Soft and nontender. No distention. Genitourinary:  deferred Musculoskeletal: No lower extremity tenderness nor edema. Neurologic:  Normal speech and language. No gross focal neurologic deficits are appreciated. Skin:  Skin is warm, dry and intact. No rash noted. Psychiatric: Mood and affect are normal. Speech and behavior are normal.  ____________________________________________   LABS (all labs ordered are listed, but only abnormal results are displayed)  Labs Reviewed  COMPREHENSIVE METABOLIC PANEL - Abnormal; Notable for the following components:      Result Value   Glucose, Bld 103 (*)    All other components within normal limits  CBC - Abnormal; Notable for the following components:   RBC 5.63 (*)    MCV 75.8 (*)    MCH 25.4 (*)    Platelets 453 (*)    All other components within normal  limits  URINALYSIS, COMPLETE (UACMP) WITH MICROSCOPIC - Abnormal; Notable for the following components:   Color, Urine YELLOW (*)    APPearance CLOUDY (*)    Specific Gravity, Urine 1.033 (*)    Ketones, ur 20 (*)    Protein, ur 30 (*)    Bacteria, UA RARE (*)    All other components within normal limits  LIPASE, BLOOD    PROCEDURES  Procedure(s) performed (including Critical Care):  Procedures   ____________________________________________   INITIAL IMPRESSION / ASSESSMENT AND PLAN / ED COURSE      25 year old female with past medical history of bipolar disorder, IBS,, PCOS, and MR KH syndrome who presents to the ED complaining of nausea, vomiting, and abdominal pain after starting on Ozempic.  Patient received Zofran and is feeling much better, has no abdominal tenderness on exam and states nausea has resolved.  Labs are unremarkable, there was apparently concern that Ozempic could affect her pancreas however lipase is within normal limits.  We will p.o. challenge with water and crackers, attempt obtain urine sample and reassess.  Patient able to tolerate p.o. without difficulty, UA shows no signs of infection.  Patient is  appropriate for discharge home, nausea and vomiting is a known side effect of Ozempic and I counseled her to speak with her OB/GYN about discontinuing this medication.  She was counseled to return to the ED for new worsening symptoms, patient agrees with plan.      ____________________________________________   FINAL CLINICAL IMPRESSION(S) / ED DIAGNOSES  Final diagnoses:  Adverse effect of drug, initial encounter  Nausea and vomiting, unspecified vomiting type     ED Discharge Orders          Ordered    ondansetron (ZOFRAN ODT) 4 MG disintegrating tablet  Every 8 hours PRN        01/28/21 0405             Note:  This document was prepared using Dragon voice recognition software and may include unintentional dictation errors.    Chesley Noon, MD 01/28/21 [phone]

## 2021-01-28 NOTE — Telephone Encounter (Signed)
What should she do is this serious ?

## 2021-01-28 NOTE — ED Notes (Signed)
Pt has tolerated po

## 2021-01-29 ENCOUNTER — Other Ambulatory Visit: Payer: Self-pay | Admitting: Obstetrics & Gynecology

## 2021-01-29 MED ORDER — METFORMIN HCL 500 MG PO TABS: ORAL_TABLET | ORAL | 12 refills | Status: DC

## 2021-01-31 ENCOUNTER — Encounter: Payer: Self-pay | Admitting: Family Medicine

## 2021-02-05 ENCOUNTER — Other Ambulatory Visit: Payer: Self-pay

## 2021-02-05 ENCOUNTER — Telehealth (INDEPENDENT_AMBULATORY_CARE_PROVIDER_SITE_OTHER): Payer: BLUE CROSS/BLUE SHIELD | Admitting: Medical

## 2021-02-05 ENCOUNTER — Encounter: Payer: Self-pay | Admitting: Medical

## 2021-02-05 VITALS — Wt 253.0 lb

## 2021-02-05 DIAGNOSIS — J3489 Other specified disorders of nose and nasal sinuses: Secondary | ICD-10-CM

## 2021-02-05 DIAGNOSIS — T50905D Adverse effect of unspecified drugs, medicaments and biological substances, subsequent encounter: Secondary | ICD-10-CM | POA: Diagnosis not present

## 2021-02-05 DIAGNOSIS — R059 Cough, unspecified: Secondary | ICD-10-CM

## 2021-02-05 DIAGNOSIS — J029 Acute pharyngitis, unspecified: Secondary | ICD-10-CM

## 2021-02-05 MED ORDER — AMOXICILLIN 875 MG PO TABS: 875.0000 mg | ORAL_TABLET | Freq: Two times a day (BID) | ORAL | 0 refills | Status: DC

## 2021-02-05 MED ORDER — HYDROCOD POLST-CPM POLST ER 10-8 MG/5ML PO SUER: 5.0000 mL | Freq: Two times a day (BID) | ORAL | 0 refills | Status: DC

## 2021-02-05 NOTE — Progress Notes (Signed)
Subjective:     Patient ID: Linda Chambers, female   DOB: [birth year], 25 y.o.   MRN: [id number]  This visit type was conducted due to national recommendations for restrictions regarding the COVID-19 Pandemic (e.g. social distancing) in an effort to limit this patient's exposure and mitigate transmission in our community.  Due to their co-morbid illnesses, this patient is at least at moderate risk for complications without adequate follow up.  This format is felt to be most appropriate for this patient at this time.    Documentation for virtual audio and video telecommunications through [facility] encounter:  The patient was located at home. The provider was located in the office. The patient did consent to this visit and is aware of possible charges through their insurance for this visit.  The other persons participating in this telemedicine service were none. Time spent on call was 20 minutes and in review of previous records 20 minutes total.  This virtual service is not related to other E/M service within previous 7 days.   HPI Chief Complaint  Patient presents with   possible flu    Cough, ST, low grade fever, chills, body aches, started last thursday   Virtual consult for illness.   She notes 6 day hx/o flu like sympotms.  She notes chills, headaches, sore throat, cough, body aches.  Has sinus pressure.   No ear pressure.  No change in smell or taste, but decreased due to congestion.  No productive cough but feels mucous wants to come up.  No SOB or wheezing.  Using robitussin syrup, using some Nyquil at night to help with couhg and sleep.  Using extra strength tylenol.  No sick contacts.   Symptoms argent resolving.  Did home covid test twice this past week, both negative.  OSA - having to breath out of mouth since nose is so  stopped up.   Went to emergency dept last weekend due to uncontrollable vomiting on ozempic.   Got dehydrated.   Was told to stop taking this. Emergency dept  switched her to metformin   No other aggravating or relieving factors. No other complaint.   Past Medical History:  Diagnosis Date   Allergic rhinitis    Anxiety    Asthma    ?excercise only   Bipolar 1 disorder (HCC)    Hypercholesteremia    IBS (irritable bowel syndrome)    OSA (obstructive sleep apnea) 02/02/2019   Ovarian cyst    Current Outpatient Medications on File Prior to Visit  Medication Sig Dispense Refill   albuterol (VENTOLIN HFA) 108 (90 Base) MCG/ACT inhaler TAKE 2 PUFFS BY MOUTH EVERY 6 HOURS AS NEEDED FOR WHEEZE OR SHORTNESS OF BREATH 8.5 each 1   cetirizine (ZYRTEC) 10 MG tablet Take 1 tablet by mouth at bedtime.     Cyanocobalamin (VITAMIN B-12 PO) Take 1 tablet by mouth at bedtime.     Galcanezumab-gnlm (EMGALITY) 120 MG/ML SOAJ Inject 120 mg into the skin every 30 (thirty) days. 1.12 mL 1   icosapent Ethyl (VASCEPA) 1 g capsule Take 2 capsules (2 g total) by mouth 2 (two) times daily. 240 capsule 2   Multiple Vitamin (MULTIVITAMIN) tablet Take 1 tablet by mouth at bedtime.     Rimegepant Sulfate (NURTEC) 75 MG TBDP Take 1 tablet by mouth daily as needed. 30 tablet 2   VRAYLAR 1.5 MG capsule Take 1.5 mg by mouth daily.     LORazepam (ATIVAN) 0.5 MG tablet Take 0.5 mg by mouth daily  as needed.     metFORMIN (GLUCOPHAGE) 500 MG tablet Take one tablet by mouth daily for one week. Then increase to one tablet twice a day for one week.  Then two tablets twice a day. (Patient not taking: Reported on 02/05/2021) 60 tablet 12   No current facility-administered medications on file prior to visit.     Review of Systems As in subjective    Objective:   Physical Exam Due to coronavirus pandemic stay at home measures, patient visit was virtual and they were not examined in person.   Wt 253 lb (114.8 kg)   BMI 43.43 kg/m   Gen: wd, wn, nad No labored breathing or wheezing Mildly ill-appearing      Assessment:     Encounter Diagnoses  Name Primary?   Sinus  pressure Yes   Sore throat    Cough, unspecified type    Adverse effect of drug, subsequent encounter        Plan:     Current respiratory tract infection symptoms have persisted for 6 days and not improving at all.  Advised good water intake, can continue Tylenol for fever or aches, begin Tussionex to help with cough and congestion.  Advise caution with sedation.  Do not overlap this with NyQuil.  Stop NyQuil.  Can use Robitussin during the day if desired instead of Tussionex.  Begin amoxicillin.  Salt water gargles and nasal saline advised.  If not much improved within the next 3 to 4 days then call back  Adverse effect of medication Ozempic, severe nausea and vomiting and dehydration.  She went to emergency department recently for this.  Ozempic was discontinued and she was changed to metformin.  I updated the allergy record to reflect intolerance to Ozempic.  Of note, Ozempic started by OB/Gyn on 01/16/2021 for obesity and prediabetes.   [name] was seen today for possible flu.  Diagnoses and all orders for this visit:  Sinus pressure  Sore throat  Cough, unspecified type  Adverse effect of drug, subsequent encounter  Other orders -     amoxicillin (AMOXIL) 875 MG tablet; Take 1 tablet (875 mg total) by mouth 2 (two) times daily for 10 days. -     chlorpheniramine-HYDROcodone (TUSSIONEX PENNKINETIC ER) 10-8 MG/5ML SUER; Take 5 mLs by mouth 2 (two) times daily.  F/u prn

## 2021-02-06 ENCOUNTER — Other Ambulatory Visit: Payer: Self-pay

## 2021-02-06 ENCOUNTER — Telehealth (INDEPENDENT_AMBULATORY_CARE_PROVIDER_SITE_OTHER): Payer: BLUE CROSS/BLUE SHIELD | Admitting: Medical

## 2021-02-06 DIAGNOSIS — F419 Anxiety disorder, unspecified: Secondary | ICD-10-CM

## 2021-02-06 DIAGNOSIS — Q528 Other specified congenital malformations of female genitalia: Secondary | ICD-10-CM

## 2021-02-06 DIAGNOSIS — F319 Bipolar disorder, unspecified: Secondary | ICD-10-CM

## 2021-02-06 DIAGNOSIS — Z79899 Other long term (current) drug therapy: Secondary | ICD-10-CM

## 2021-02-06 DIAGNOSIS — R635 Abnormal weight gain: Secondary | ICD-10-CM

## 2021-02-06 DIAGNOSIS — G4733 Obstructive sleep apnea (adult) (pediatric): Secondary | ICD-10-CM

## 2021-02-06 DIAGNOSIS — G43701 Chronic migraine without aura, not intractable, with status migrainosus: Secondary | ICD-10-CM

## 2021-02-06 DIAGNOSIS — Z6841 Body Mass Index (BMI) 40.0 and over, adult: Secondary | ICD-10-CM | POA: Diagnosis not present

## 2021-02-06 DIAGNOSIS — R7989 Other specified abnormal findings of blood chemistry: Secondary | ICD-10-CM

## 2021-02-06 DIAGNOSIS — Z8742 Personal history of other diseases of the female genital tract: Secondary | ICD-10-CM

## 2021-02-06 DIAGNOSIS — Q8789 Other specified congenital malformation syndromes, not elsewhere classified: Secondary | ICD-10-CM

## 2021-02-06 DIAGNOSIS — R7301 Impaired fasting glucose: Secondary | ICD-10-CM

## 2021-02-06 NOTE — Progress Notes (Signed)
This visit type was conducted due to national recommendations for restrictions regarding the COVID-19 Pandemic (e.g. social distancing) in an effort to limit this patient's exposure and mitigate transmission in our community.  Due to their co-morbid illnesses, this patient is at least at moderate risk for complications without adequate follow up.  This format is felt to be most appropriate for this patient at this time.    Documentation for virtual audio and video telecommunications through [facility] encounter:  The patient was located at home. The provider was located in the office. The patient did consent to this visit and is aware of possible charges through their insurance for this visit.  The other persons participating in this telemedicine service were none. Time spent on call was 25 minutes and in review of previous records >30 minutes total.  This virtual service is not related to other E/M service within previous 7 days.   Subjective:  Linda Chambers is a 25 y.o. female who presents for Chief Complaint  Patient presents with   discuss metformin    Obgyn Dr. Jennye Boroughs prescribed metformin and wants to know if ok to take med. Discuss MRI pituary results too     Virtual consult to discuss weight gain and obesity.  She wants help with efforts to lose weight.  She is seeing the nutritionist now about 8 pounds recently.  She has been making dietary changes.  She is exercising regularly, at least 30 minutes of walking daily but using more than this.  She is doing some weightbearing exercises well  Recently she saw OB/GYN and they recommended she begin metformin.  She has not yet started this.  She does not officially have a diagnosis of polycystic ovarian syndrome but there is concern for this given ovarian cyst.  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  Impaired glucose, insulin resistance-she had some recent blood test she wants me to  look at the chart record.  She had given Ozempic to try per gynecology but had significant severe nausea and vomiting and had to go to the emergency department.  So she stopped Ozempic  She has never been on metformin.  She continues to gain weight over the last few years despite trying to lose weight  Migraines-still not well controlled.  She has seen some improvement but still would like to see some improvements in her relatively frequent headaches  With nutrition counseling, they talked about her disordered eating thoughts, and she is eating small meals more frequently throughout the day with healthy snacks.  OSA-compliant with CPAP  No family history thyroid tumor.  Father had hypertension.  No other aggravating or relieving factors.    No other c/o.  Past Medical History:  Diagnosis Date   Allergic rhinitis    Anxiety    Asthma    ?excercise only   Bipolar 1 disorder (HCC)    Hypercholesteremia    IBS (irritable bowel syndrome)    OSA (obstructive sleep apnea) 02/02/2019   Ovarian cyst    Current Outpatient Medications on File Prior to Visit  Medication Sig Dispense Refill   albuterol (VENTOLIN HFA) 108 (90 Base) MCG/ACT inhaler TAKE 2 PUFFS BY MOUTH EVERY 6 HOURS AS NEEDED FOR WHEEZE OR SHORTNESS OF BREATH 8.5 each 1   cetirizine (ZYRTEC) 10 MG tablet Take 1 tablet by mouth at bedtime.     Cyanocobalamin (VITAMIN B-12 PO) Take 1 tablet by mouth at bedtime.  Galcanezumab-gnlm (EMGALITY) 120 MG/ML SOAJ Inject 120 mg into the skin every 30 (thirty) days. 1.12 mL 1   icosapent Ethyl (VASCEPA) 1 g capsule Take 2 capsules (2 g total) by mouth 2 (two) times daily. 240 capsule 2   LORazepam (ATIVAN) 0.5 MG tablet Take 0.5 mg by mouth daily as needed.     metFORMIN (GLUCOPHAGE) 500 MG tablet Take one tablet by mouth daily for one week. Then increase to one tablet twice a day for one week.  Then two tablets twice a day. (Patient not taking: Reported on 02/05/2021) 60 tablet 12    Multiple Vitamin (MULTIVITAMIN) tablet Take 1 tablet by mouth at bedtime.     Rimegepant Sulfate (NURTEC) 75 MG TBDP Take 1 tablet by mouth daily as needed. 30 tablet 2   VRAYLAR 1.5 MG capsule Take 1.5 mg by mouth daily.     No current facility-administered medications on file prior to visit.   Past Surgical History:  Procedure Laterality Date   ABDOMINAL SURGERY     VAGINOPLASTY     WISDOM TOOTH EXTRACTION Bilateral      The following portions of the patient's history were reviewed and updated as appropriate: allergies, current medications, past family history, past medical history, past social history, past surgical history and problem list.  ROS Otherwise as in subjective above   Objective: Wt Readings from Last 3 Encounters:  02/05/21 253 lb (114.8 kg)  01/27/21 250 lb (113.4 kg)  01/15/21 254 lb (115.2 kg)    General appearance: alert, no distress, well developed, well nourished Not examined in person as this was a virtual consult    Assessment: Encounter Diagnoses  Name Primary?   Weight gain Yes   Elevated prolactin level    BMI 40.0-44.9, adult (HCC)    OSA (obstructive sleep apnea)    Mayer-Rokitansky-Kuster-Hauser syndrome    Impaired fasting blood sugar    Hx of ovarian cyst    High risk medication use    Anxiety    Bipolar 1 disorder (HCC)    Chronic migraine without aura with status migrainosus, not intractable      Plan: Weight gain, obesity-counseled on exercise, she has seen nutrition counseling for several recent visits, is working on healthy lifestyle changes.  We discussed possible medications.  Begin metformin to help stabilize blood sugars and help with insulin resistance.  We discussed other potential medications to help with weight loss.  We want to avoid sympathomimetics currently given her ongoing headaches/migraines, as this may worsen headaches.  Impaired glucose, insulin resistance-begin metformin, continue healthy diet and exercise.   She recently has seen nutritionist for several visits and has been making changes in diet and activity  Elevated prolactin-29 ng/mL in August, but recent blood test by psychiatrist showed a level of 10, much improved into normal range.  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  No definite pituitary microadenoma.  Referral for endocrinology to decide on whether we need to continue the prolactin or not or what other follow-up this may require  Migraines/chronic headaches-currently using Nurtec and Emgality.  She is having some issues at the pharmacy getting a refill despite we have sent them already.  We discussed other medication options.  Begin trial of Topamax for headahe prevention and hopefully will help with weight loss  as well.    Discussed proper use of this.  Begin Topamax 1-2 weeks after starting metformin.  Anxiety, bipolar-she sees psychiatry  Sleep apnea-continue CPAP   Melyssa was seen today for discuss metformin.  Diagnoses and all orders for this visit:  Weight gain -     Ambulatory referral to Endocrinology  Elevated prolactin level -     Ambulatory referral to Endocrinology  BMI 40.0-44.9, adult (HCC) -     Ambulatory referral to Endocrinology  OSA (obstructive sleep apnea)  Mayer-Rokitansky-Kuster-Hauser syndrome -     Ambulatory referral to Endocrinology  Impaired fasting blood sugar -     Ambulatory referral to Endocrinology  Hx of ovarian cyst  High risk medication use  Anxiety  Bipolar 1 disorder (HCC)  Chronic migraine without aura with status migrainosus, not intractable  Other orders -     topiramate (TOPAMAX) 25 MG tablet; Take 1 tablet (25 mg total) by mouth 2 (two) times daily.   Follow up: pending call back

## 2021-02-07 ENCOUNTER — Telehealth: Payer: Self-pay | Admitting: Medical

## 2021-02-07 MED ORDER — TOPIRAMATE 25 MG PO TABS
25.0000 mg | ORAL_TABLET | Freq: Two times a day (BID) | ORAL | 1 refills | Status: DC
Start: 2021-02-07 — End: 2021-03-11

## 2021-02-07 NOTE — Telephone Encounter (Signed)
As a follow-up from our call yesterday, I would like to have her begin Topamax.  Topamax is a seizure medicine but we use it often for headache prevention.  It can also help with weight loss or has been shown to help improve weight loss efforts.  I would like her to start the metformin we discussed.  After she has been on metformin a few weeks and tolerating this just fine, then she can begin Topamax 25 mg once daily at night.  I do not want to start Topamax and metformin at the same time just to watch for any type of side effect.  After using the Topamax 25 mg for 1 to 2 weeks, she can then go to 25 mg twice a day if no issues.  I have put in the referral for endocrinology  Lets plan to follow-up somewhere between 1 and 2 months to give time to try out metformin and Topamax in regards to sugar control, headache control, and weight loss efforts

## 2021-02-07 NOTE — Telephone Encounter (Signed)
See the other message as well  Please also call the pharmacy.  She says she is having trouble getting her Nurtec and Emgality  Not sure what the issue is.  If we need to give samples or do something on our end, let me know

## 2021-02-08 ENCOUNTER — Encounter: Payer: Self-pay | Admitting: Family Medicine

## 2021-03-01 ENCOUNTER — Other Ambulatory Visit: Payer: Self-pay | Admitting: Obstetrics and Gynecology

## 2021-03-01 DIAGNOSIS — Z3009 Encounter for other general counseling and advice on contraception: Secondary | ICD-10-CM

## 2021-03-01 DIAGNOSIS — E282 Polycystic ovarian syndrome: Secondary | ICD-10-CM

## 2021-03-07 ENCOUNTER — Other Ambulatory Visit: Payer: Self-pay | Admitting: Medical

## 2021-03-23 ENCOUNTER — Telehealth: Payer: Self-pay

## 2021-03-23 NOTE — Telephone Encounter (Signed)
P.A. [place] renewal

## 2021-03-29 NOTE — Telephone Encounter (Signed)
P.A. approved til 03/23/22, sent pt mychart message

## 2021-04-12 ENCOUNTER — Telehealth: Payer: Self-pay

## 2021-04-12 NOTE — Telephone Encounter (Signed)
When does pt need a follow up appt?

## 2021-04-15 ENCOUNTER — Encounter: Payer: Self-pay | Admitting: Obstetrics & Gynecology

## 2021-04-16 ENCOUNTER — Other Ambulatory Visit: Payer: Self-pay | Admitting: Obstetrics & Gynecology

## 2021-04-16 DIAGNOSIS — Z8742 Personal history of other diseases of the female genital tract: Secondary | ICD-10-CM

## 2021-04-16 DIAGNOSIS — R109 Unspecified abdominal pain: Secondary | ICD-10-CM

## 2021-04-16 NOTE — Telephone Encounter (Signed)
[facility] [place] (ordered) and tele f/u (or I can just call her)

## 2021-04-16 NOTE — Telephone Encounter (Signed)
Contacted patient via phone. Patient is scheduled for 05/22/21. I gave patient number to centralized scheduling for patient to contact them to scheduled ultrasound

## 2021-04-18 ENCOUNTER — Other Ambulatory Visit: Payer: Self-pay

## 2021-04-18 ENCOUNTER — Other Ambulatory Visit: Payer: Self-pay | Admitting: Obstetrics & Gynecology

## 2021-04-18 ENCOUNTER — Emergency Department (HOSPITAL_COMMUNITY)
Admission: EM | Admit: 2021-04-18 | Discharge: 2021-04-18 | Disposition: A | Discharge status: Home / Self Care | Payer: BLUE CROSS/BLUE SHIELD | Attending: Emergency Medicine | Admitting: Emergency Medicine

## 2021-04-18 ENCOUNTER — Ambulatory Visit
Admission: RE | Admit: 2021-04-18 | Discharge: 2021-04-18 | Disposition: A | Discharge status: Home / Self Care | Payer: BLUE CROSS/BLUE SHIELD | Source: Ambulatory Visit | Attending: Obstetrics & Gynecology | Admitting: Obstetrics & Gynecology

## 2021-04-18 DIAGNOSIS — Z8742 Personal history of other diseases of the female genital tract: Secondary | ICD-10-CM | POA: Insufficient documentation

## 2021-04-18 DIAGNOSIS — R109 Unspecified abdominal pain: Secondary | ICD-10-CM | POA: Insufficient documentation

## 2021-04-18 DIAGNOSIS — Z5321 Procedure and treatment not carried out due to patient leaving prior to being seen by health care provider: Secondary | ICD-10-CM | POA: Insufficient documentation

## 2021-04-18 DIAGNOSIS — R1032 Left lower quadrant pain: Secondary | ICD-10-CM | POA: Insufficient documentation

## 2021-04-18 DIAGNOSIS — R197 Diarrhea, unspecified: Secondary | ICD-10-CM | POA: Diagnosis not present

## 2021-04-18 DIAGNOSIS — R1031 Right lower quadrant pain: Secondary | ICD-10-CM | POA: Insufficient documentation

## 2021-04-18 NOTE — ED Notes (Signed)
Pt stated she is seeing a specialist in the am

## 2021-04-18 NOTE — ED Triage Notes (Signed)
Pt reports abdominal pain x 1 week. For the last week has been isolated to the R side and today has become generalized across abdomen. States this happens once every two months or so. Has associated diarrhea. Pain is mostly in RLQ and LLQ. Had ultrasound this morning that showed no ovarian cysts.

## 2021-04-22 ENCOUNTER — Other Ambulatory Visit: Payer: Self-pay

## 2021-04-22 ENCOUNTER — Ambulatory Visit (INDEPENDENT_AMBULATORY_CARE_PROVIDER_SITE_OTHER): Payer: BLUE CROSS/BLUE SHIELD | Admitting: Medical

## 2021-04-22 VITALS — BP 120/80 | HR 74 | Wt 250.6 lb

## 2021-04-22 DIAGNOSIS — R195 Other fecal abnormalities: Secondary | ICD-10-CM | POA: Diagnosis not present

## 2021-04-22 DIAGNOSIS — R7989 Other specified abnormal findings of blood chemistry: Secondary | ICD-10-CM | POA: Diagnosis not present

## 2021-04-22 DIAGNOSIS — R1084 Generalized abdominal pain: Secondary | ICD-10-CM

## 2021-04-22 DIAGNOSIS — M545 Low back pain, unspecified: Secondary | ICD-10-CM

## 2021-04-22 LAB — POCT URINALYSIS DIP (PROADVANTAGE DEVICE)
Bilirubin, UA: NEGATIVE
Blood, UA: NEGATIVE
Glucose, UA: NEGATIVE mg/dL
Ketones, POC UA: NEGATIVE mg/dL
Leukocytes, UA: NEGATIVE
Nitrite, UA: NEGATIVE
Protein Ur, POC: NEGATIVE mg/dL
Specific Gravity, Urine: 1.01
Urobilinogen, Ur: NEGATIVE
pH, UA: 6 (ref 5.0–8.0)

## 2021-04-22 NOTE — Progress Notes (Signed)
Subjective:  Linda Chambers is a 26 y.o. female who presents for Chief Complaint  Patient presents with   Abdominal Pain    Abdominal pain- cramping- sharp pain feels like something is exploding in stomach for a couple hours and then will go away but it has lasted 2 weeks. Wants tests done that she has written down. U/S on kidneys if possible      Here with her female partner today.  Medical team: Dr. Velora Mediate, OB/gyn in [place], [place] Dr. Tamsen Roers, psychiatrist Augusto Garbe, Therapist  Dr. Lannette Donath, GI No prior urologist  Medical history is significant for chronic migraine, asthma, high cholesterol, bipolar disorder, sleep apnea, impaired glucose, obesity, MRKH syndrome, history of ovarian cyst, congenital absence of uterus.    Concerns: She notes a long history of abdominal pain.  Every few months gets crampy pains, almost like menstrual pains but without bleeding.   Then turns in to sharp pains. Current episode has pain bilat lower abdomen/pelvis, low back pain.  Will get associated diarrhea.  After 1-2 weeks pain intensifies to all over abdomen.  Feels like something explodes in abdomen, cramping and sharp pains.     She was talking with a nephrologist friend who recommended several studies including CT scan of abdomen pelvis, basic metabolic lab, thoracic lumbar spine x-ray, urinalysis.   She notes years ago had ovarian cyst that gave similar symptoms.  She sees gynecology and just recently had an ultrasound of her ovaries within the past 2 weeks that was normal for her ovaries.  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  Nevertheless she is still concerned about ovarian cyst.  She notes history UTI in past several times.  None in recent weeks and no current urinary symptoms.  No blood in urine.  No urinary frequency, urgency, pressure, odorous urine.  Has been getting some nausea.  She has seen gastroenterology in the past and was given a diagnosis of IBS.  She denies any prior upper or  lower endoscopies.  She denies any significant major stress.  She does walk some for exercise but not much stretching.  She does have some low back pain on the right that does hurt with some movement.  No leg pains.  She does not think her diet is playing a role with her symptoms.  Doesn't eat a lot of dairy, lactose free milk , rare coffee.  She does get some belching.  No heartburn.  No other aggravating or relieving factors.    No other c/o.  Past Medical History:  Diagnosis Date   Allergic rhinitis    Anxiety    Asthma    ?excercise only   Bipolar 1 disorder (HCC)    Hypercholesteremia    IBS (irritable bowel syndrome)    OSA (obstructive sleep apnea) 02/02/2019   Ovarian cyst    Past Surgical History:  Procedure Laterality Date   ABDOMINAL SURGERY     VAGINOPLASTY     WISDOM TOOTH EXTRACTION Bilateral     The following portions of the patient's history were reviewed and updated as appropriate: allergies, current medications, past family history, past medical history, past social history, past surgical history and problem list.  ROS Otherwise as in subjective above  Objective: BP 120/80    Pulse 74    Wt 250 lb 9.6 oz (113.7 kg)    BMI 43.02 kg/m   General appearance: alert, no distress, well developed, well nourished Neck: supple, no lymphadenopathy, no thyromegaly, no masses Heart: RRR,  normal S1, S2, no murmurs Lungs: CTA bilaterally, no wheezes, rhonchi, or rales Abdomen: +bs, soft, port surgical scars noted, tender particularly in the right lower abdomen and mid abdomen, otherwise non tender, non distended, no masses, no hepatomegaly, no splenomegaly Back without tenderness that she notes some pain in the right lumbar paraspinal region with range of motion which is relatively full , no other deformity noted Pulses: 2+ radial pulses, 2+ pedal pulses, normal cap refill Ext: no edema  Transabdominal Ultrasound pelvis 04/18/2021, Dr. Velora Mediate,  gynecology IMPRESSION: Congenital absence of the uterus/endometrial complex.   Unremarkable ovaries and adnexa.  CT abdomen pelvis 10/09/2020 IMPRESSION: Hepatic steatosis.   No acute findings.     Assessment: Encounter Diagnoses  Name Primary?   Generalized abdominal pain Yes   Elevated prolactin level    Loose stools    Acute bilateral low back pain without sciatica      Plan: We discussed her mix of symptoms today.  Labs as below today.  I reviewed chart record showing CT abdomen pelvis and ultrasound of pelvis as listed above  I recommend referral back to gastroenterology given chronic abdominal pain, intermittent loose stools, presenting prior diagnosis of IBS.  She does not have any current urinary symptoms or reasons to suggest need to refer to urology.  She has had occasional urinary tract infection in the past year.  If she continues to get urinary tract infection we can pursue urology work-up  Low back pain-I recommend regular exercise and stretching.  She does not have any major symptoms warranting spinal imaging today.  If her symptoms continue we can certainly pursue that.  Elevated prolactin-this was found incidentally when evaluating her headaches and pursuing head imaging.  Unfortunately the endocrinology referrals are backed up currently.  She has been waiting several months.  We will trial referring to a different office for further evaluation of elevated prolactin.  I recommend she follow-up with gynecology regarding intermittent pelvic pain.  [name] was seen today for abdominal pain.  Diagnoses and all orders for this visit:  Generalized abdominal pain -     POCT Urinalysis DIP (Proadvantage Device) -     CBC with Differential/Platelet -     Comprehensive metabolic panel  Elevated prolactin level -     Ambulatory referral to Endocrinology  Loose stools  Acute bilateral low back pain without sciatica    Follow up: Pending labs

## 2021-04-23 ENCOUNTER — Telehealth: Payer: Self-pay

## 2021-04-23 ENCOUNTER — Emergency Department
Admission: EM | Admit: 2021-04-23 | Discharge: 2021-04-24 | Disposition: A | Discharge status: Home / Self Care | Payer: BLUE CROSS/BLUE SHIELD | Attending: Emergency Medicine | Admitting: Emergency Medicine

## 2021-04-23 ENCOUNTER — Emergency Department: Payer: BLUE CROSS/BLUE SHIELD

## 2021-04-23 ENCOUNTER — Other Ambulatory Visit: Payer: Self-pay | Admitting: Medical

## 2021-04-23 ENCOUNTER — Other Ambulatory Visit: Payer: Self-pay

## 2021-04-23 DIAGNOSIS — R1030 Lower abdominal pain, unspecified: Secondary | ICD-10-CM | POA: Diagnosis present

## 2021-04-23 DIAGNOSIS — K760 Fatty (change of) liver, not elsewhere classified: Secondary | ICD-10-CM | POA: Diagnosis not present

## 2021-04-23 DIAGNOSIS — N83201 Unspecified ovarian cyst, right side: Secondary | ICD-10-CM

## 2021-04-23 DIAGNOSIS — R102 Pelvic and perineal pain: Secondary | ICD-10-CM

## 2021-04-23 DIAGNOSIS — R109 Unspecified abdominal pain: Secondary | ICD-10-CM

## 2021-04-23 LAB — COMPREHENSIVE METABOLIC PANEL
ALT: 42 IU/L — ABNORMAL HIGH (ref 0–32)
ALT: 40 U/L (ref 0–44)
AST: 28 IU/L (ref 0–40)
AST: 30 U/L (ref 15–41)
Albumin/Globulin Ratio: 1.4 (ref 1.2–2.2)
Albumin: 4.6 g/dL (ref 3.9–5.0)
Albumin: 3.9 g/dL (ref 3.5–5.0)
Alkaline Phosphatase: 109 IU/L (ref 44–121)
Alkaline Phosphatase: 74 U/L (ref 38–126)
Anion gap: 5 (ref 5–15)
BUN/Creatinine Ratio: 12 (ref 9–23)
BUN: 8 mg/dL (ref 6–20)
BUN: 10 mg/dL (ref 6–20)
Bilirubin Total: <0.2 mg/dL (ref 0.0–1.2)
CO2: 23 mmol/L (ref 20–29)
CO2: 26 mmol/L (ref 22–32)
Calcium: 10 mg/dL (ref 8.7–10.2)
Calcium: 8.9 mg/dL (ref 8.9–10.3)
Chloride: 100 mmol/L (ref 96–106)
Chloride: 105 mmol/L (ref 98–111)
Creatinine, Ser: 0.69 mg/dL (ref 0.57–1.00)
Creatinine, Ser: 0.65 mg/dL (ref 0.44–1.00)
GFR, Estimated: >60 mL/min (ref >60)
Globulin, Total: 3.2 g/dL (ref 1.5–4.5)
Glucose, Bld: 100 mg/dL — ABNORMAL HIGH (ref 70–99)
Glucose: 89 mg/dL (ref 70–99)
Potassium: 4.6 mmol/L (ref 3.5–5.2)
Potassium: 3.9 mmol/L (ref 3.5–5.1)
Sodium: 141 mmol/L (ref 134–144)
Sodium: 136 mmol/L (ref 135–145)
Total Bilirubin: 0.6 mg/dL (ref 0.3–1.2)
Total Protein: 7.8 g/dL (ref 6.0–8.5)
Total Protein: 7.3 g/dL (ref 6.5–8.1)
eGFR: 125 mL/min/{1.73_m2} (ref >59)

## 2021-04-23 LAB — CBC WITH DIFFERENTIAL/PLATELET
Basophils Absolute: 0 10*3/uL (ref 0.0–0.2)
Basos: 0 %
EOS (ABSOLUTE): 0.2 10*3/uL (ref 0.0–0.4)
Eos: 1 %
Hematocrit: 44.9 % (ref 34.0–46.6)
Hemoglobin: 14.2 g/dL (ref 11.1–15.9)
Immature Grans (Abs): 0.1 10*3/uL (ref 0.0–0.1)
Immature Granulocytes: 1 %
Lymphocytes Absolute: 3.7 10*3/uL — ABNORMAL HIGH (ref 0.7–3.1)
Lymphs: 31 %
MCH: 24 pg — ABNORMAL LOW (ref 26.6–33.0)
MCHC: 31.6 g/dL (ref 31.5–35.7)
MCV: 76 fL — ABNORMAL LOW (ref 79–97)
Monocytes Absolute: 0.8 10*3/uL (ref 0.1–0.9)
Monocytes: 7 %
Neutrophils Absolute: 6.9 10*3/uL (ref 1.4–7.0)
Neutrophils: 60 %
Platelets: 515 10*3/uL — ABNORMAL HIGH (ref 150–450)
RBC: 5.92 x10E6/uL — ABNORMAL HIGH (ref 3.77–5.28)
RDW: 15.7 % — ABNORMAL HIGH (ref 11.7–15.4)
WBC: 11.6 10*3/uL — ABNORMAL HIGH (ref 3.4–10.8)

## 2021-04-23 LAB — CBC
HCT: 41.7 % (ref 36.0–46.0)
Hemoglobin: 13.3 g/dL (ref 12.0–15.0)
MCH: 24.2 pg — ABNORMAL LOW (ref 26.0–34.0)
MCHC: 31.9 g/dL (ref 30.0–36.0)
MCV: 76 fL — ABNORMAL LOW (ref 80.0–100.0)
Platelets: 453 10*3/uL — ABNORMAL HIGH (ref 150–400)
RBC: 5.49 MIL/uL — ABNORMAL HIGH (ref 3.87–5.11)
RDW: 15.7 % — ABNORMAL HIGH (ref 11.5–15.5)
WBC: 10.8 10*3/uL — ABNORMAL HIGH (ref 4.0–10.5)
nRBC: 0 % (ref 0.0–0.2)

## 2021-04-23 LAB — LIPASE, BLOOD: Lipase: 41 U/L (ref 11–51)

## 2021-04-23 MED ORDER — HYDROCODONE-ACETAMINOPHEN 5-325 MG PO TABS: 1.0000 | ORAL_TABLET | Freq: Four times a day (QID) | ORAL | 0 refills | Status: DC | PRN

## 2021-04-23 MED ORDER — ONDANSETRON 4 MG PO TBDP: 4.0000 mg | ORAL_TABLET | Freq: Once | ORAL | Status: DC | PRN

## 2021-04-23 NOTE — ED Triage Notes (Addendum)
Pt arrived via POV with reports of abd pain, has scheduled [place] in the morning for the pain, but states the pain is worse tonight.  Pt c/o pain in RLQ and LLQ.   Pt seen at [facility] on 1/5 for the same sxs. Pt states the pain is more sharp now.  Pt c/o nause and diarrhea.    Pt was prescribed hydrocodone today for pain, last dose taken around 7pm.

## 2021-04-23 NOTE — Telephone Encounter (Signed)
Pt. Called back for lab results and gave her your recommendations. She said the pain is worse today on the rt. Side and stomach is tender to the touch and she is also has some nausea. She is ok seeing the same GI doctor.

## 2021-04-23 NOTE — ED Provider Notes (Signed)
[facility] Provider Note    Event Date/Time   First MD Initiated Contact with Patient 04/23/21 2104     (approximate)   History   Abdominal Pain, Diarrhea, and Nausea   HPI  Linda Chambers is a 26 y.o. female  who, per ob/gyn note dated 48.4.22 has history of congenitally absent uterus who has history of lower abdominal pain that occurs occasionally and lasts for a weeks, presents to the emergency department for lower abdominal pain.  The patient states that this pain has been going on for the past 2 weeks.  Located in the lower abdomen.  She describes it as severe.  She has seen her outpatient doctor for this and he is scheduled for an ultrasound tomorrow however given that the pain was getting worse she decided to come to the ED tonight.  She states she has had similar pain in the past.  She states that she was told she had IBS by GI however she is not sure of that diagnosis.  Patient denies any recent fevers.   Physical Exam   Triage Vital Signs: ED Triage Vitals  Enc Vitals Group     BP 04/23/21 1952 116/76     Pulse Rate 04/23/21 1952 97     Resp 04/23/21 1952 (!) 22     Temp 04/23/21 1952 98.7 F (37.1 C)     Temp Source 04/23/21 1952 Oral     SpO2 04/23/21 1952 99 %     Weight 04/23/21 1950 250 lb (113.4 kg)     Height 04/23/21 1950 5\' 4"  (1.626 m)     Head Circumference --      Peak Flow --      Pain Score 04/23/21 1947 9     Pain Loc --      Pain Edu? --      Excl. in GC? --     Most recent vital signs: Vitals:   04/23/21 1952  BP: 116/76  Pulse: 97  Resp: (!) 22  Temp: 98.7 F (37.1 C)  SpO2: 99%     General: Awake, no distress.  CV:  Good peripheral perfusion.  Resp:  Normal effort.  Abd:  No distention. Tender to palpation in the lower abdomen.   ED Results / Procedures / Treatments   Labs (all labs ordered are listed, but only abnormal results are displayed) Labs Reviewed  COMPREHENSIVE METABOLIC PANEL - Abnormal;  Notable for the following components:      Result Value   Glucose, Bld 100 (*)    All other components within normal limits  CBC - Abnormal; Notable for the following components:   WBC 10.8 (*)    RBC 5.49 (*)    MCV 76.0 (*)    MCH 24.2 (*)    RDW 15.7 (*)    Platelets 453 (*)    All other components within normal limits  LIPASE, BLOOD  URINALYSIS, ROUTINE W REFLEX MICROSCOPIC     EKG  None   RADIOLOGY [place] abd complete IMPRESSION:  Hepatic steatosis.     No acute findings.      US pelvic complete    IMPRESSION:  Absent uterus and nonvisualization of the ovaries.     PROCEDURES:  Critical Care performed: No  Procedures   MEDICATIONS ORDERED IN ED: Medications  ondansetron (ZOFRAN-ODT) disintegrating tablet 4 mg (has no administration in time range)     IMPRESSION / MDM / ASSESSMENT AND PLAN / ED COURSE  I  reviewed the triage vital signs and the nursing notes.                              Differential diagnosis includes, but is not limited to, gastroenteritis, colitis, IBS, ovarian cyst, endometriosis.  Patient presents to the emergency department today with concerns for lower abdominal pain.  It does appear that this pain is not new for the patient.  Does appear to come and go.  She has been evaluated in the past both by GI as well as OB/GYN per chart review.  GI does seem to think it was related to IBS.  OB/GYN did mention in 1 note that they thought it was related to ovarian cyst.  Ultrasounds were ordered in triage prior to my evaluation.  These did not show any concerning findings.  Will get CT scan to evaluate for any potential abnormalities.  I do think a CT scan is unremarkable patient would be appropriate for further outpatient work-up.   FINAL CLINICAL IMPRESSION(S) / ED DIAGNOSES   Final diagnoses:  Abdominal pain     Note:  This document was prepared using Dragon voice recognition software and may include unintentional dictation errors.     Nance Pear, MD 04/23/21 [phone]

## 2021-04-23 NOTE — Telephone Encounter (Signed)
Patient notified- scheduled at [facility] @ 8 am tomorrow. She is going to pick up Hydrocodone this evening and take.  For imaging patient has to be NPO for 8 hours, instructed pt NPO after midnight tonight and go to ER if drastically worsens before then   Routing for [facility]

## 2021-04-23 NOTE — ED Notes (Signed)
Declined zofran at this time.

## 2021-04-23 NOTE — Telephone Encounter (Signed)
Spoke to patient- abdomen hurting worse today and is entire stomach not just one spot  Diarrhea 3-4 times in the past hour,   Nausea  Feels hot with chills her hands and feet are freezing has not checked temp  States she doesn't want to go back to ER if she doesn't have to  She wants something for pain or if [name] thinks she needs that or ER

## 2021-04-24 ENCOUNTER — Emergency Department: Payer: BLUE CROSS/BLUE SHIELD

## 2021-04-24 ENCOUNTER — Ambulatory Visit (HOSPITAL_BASED_OUTPATIENT_CLINIC_OR_DEPARTMENT_OTHER): Payer: BLUE CROSS/BLUE SHIELD

## 2021-04-24 ENCOUNTER — Other Ambulatory Visit: Payer: Self-pay | Admitting: Medical

## 2021-04-24 ENCOUNTER — Telehealth: Payer: Self-pay | Admitting: Medical

## 2021-04-24 DIAGNOSIS — K589 Irritable bowel syndrome without diarrhea: Secondary | ICD-10-CM

## 2021-04-24 DIAGNOSIS — G8929 Other chronic pain: Secondary | ICD-10-CM

## 2021-04-24 LAB — URINALYSIS, ROUTINE W REFLEX MICROSCOPIC
Bilirubin Urine: NEGATIVE
Glucose, UA: NEGATIVE mg/dL
Ketones, ur: NEGATIVE mg/dL
Leukocytes,Ua: NEGATIVE
Nitrite: NEGATIVE
Protein, ur: NEGATIVE mg/dL
Specific Gravity, Urine: 1.017 (ref 1.005–1.030)
pH: 5 (ref 5.0–8.0)

## 2021-04-24 MED ORDER — IOHEXOL 300 MG/ML  SOLN
100.0000 mL | Freq: Once | INTRAMUSCULAR | Status: AC | PRN
  Administered 2021-04-24: 100 mL via INTRAVENOUS
  Filled 2021-04-24: qty 100

## 2021-04-24 NOTE — ED Provider Notes (Signed)
Accepted care of this patient from Dr. Derrill Kay at 11 PM pending CT and urine.  Urine is negative for UTI.  CT consistent with a right ovarian cyst.  Recommended outpatient follow-up.  Discussed my standard return precautions.   Don Perking, [place], MD 04/24/21 [phone]

## 2021-04-24 NOTE — Telephone Encounter (Signed)
I sent this to [facility] as I am not sure who will get to it first.    Of note, it appears she went to the emergency department last night and CT scan was done.  Interestingly there was a new ovarian cyst, and the ovary was higher in the abdomen than typical.  So I am assuming she is not going to go to the ultrasound this morning.  Please verify with her about if that is correct then lets go ahead and cancel the ultrasound.  I did put a referral in to gastroenterology for her to go back and see Dr. Marius Ditch for chronic abdominal pain  She can certainly follow-up with her gynecologist about the ovarian cyst  I do not think they found anything else worrisome last night but verify with her?

## 2021-04-24 NOTE — ED Notes (Signed)
E signature pad not working. Pt educated on discharge instructions and verbalized understanding.  

## 2021-04-24 NOTE — Telephone Encounter (Signed)
Pt was notified. Pt is not going for U/S today. Pt will contact obgyn to discuss cyst

## 2021-04-25 NOTE — Telephone Encounter (Signed)
Can you check the status of endocrinology referral for me please

## 2021-04-26 ENCOUNTER — Encounter: Payer: Self-pay | Admitting: Internal Medicine

## 2021-05-01 ENCOUNTER — Telehealth: Payer: Self-pay | Admitting: Family Medicine

## 2021-05-01 NOTE — Telephone Encounter (Signed)
Pt called and wants to remain with a female.  She is being followed by [name] since [name] has left.  She will become a [name] patients after Feb 1.  Pt is agreeable.

## 2021-05-02 ENCOUNTER — Telehealth (INDEPENDENT_AMBULATORY_CARE_PROVIDER_SITE_OTHER): Payer: BLUE CROSS/BLUE SHIELD | Admitting: Medical

## 2021-05-02 ENCOUNTER — Encounter: Payer: Self-pay | Admitting: Medical

## 2021-05-02 VITALS — Wt 250.0 lb

## 2021-05-02 DIAGNOSIS — G4733 Obstructive sleep apnea (adult) (pediatric): Secondary | ICD-10-CM

## 2021-05-02 DIAGNOSIS — R109 Unspecified abdominal pain: Secondary | ICD-10-CM

## 2021-05-02 DIAGNOSIS — G8929 Other chronic pain: Secondary | ICD-10-CM

## 2021-05-02 DIAGNOSIS — B999 Unspecified infectious disease: Secondary | ICD-10-CM

## 2021-05-02 DIAGNOSIS — J4520 Mild intermittent asthma, uncomplicated: Secondary | ICD-10-CM

## 2021-05-02 DIAGNOSIS — R509 Fever, unspecified: Secondary | ICD-10-CM | POA: Diagnosis not present

## 2021-05-02 DIAGNOSIS — R799 Abnormal finding of blood chemistry, unspecified: Secondary | ICD-10-CM

## 2021-05-02 DIAGNOSIS — U071 COVID-19: Secondary | ICD-10-CM | POA: Diagnosis not present

## 2021-05-02 DIAGNOSIS — Z6841 Body Mass Index (BMI) 40.0 and over, adult: Secondary | ICD-10-CM

## 2021-05-02 MED ORDER — ALBUTEROL SULFATE HFA 108 (90 BASE) MCG/ACT IN AERS: INHALATION_SPRAY | RESPIRATORY_TRACT | 1 refills | Status: DC

## 2021-05-02 MED ORDER — EMERGEN-C IMMUNE PLUS PO PACK: 1.0000 | PACK | Freq: Two times a day (BID) | ORAL | 0 refills | Status: DC

## 2021-05-02 MED ORDER — NIRMATRELVIR/RITONAVIR (PAXLOVID) TABLET (RENAL DOSING): 2.0000 | ORAL_TABLET | Freq: Two times a day (BID) | ORAL | 0 refills | Status: AC

## 2021-05-02 NOTE — Telephone Encounter (Signed)
Pt has appt

## 2021-05-02 NOTE — Progress Notes (Signed)
Subjective:     Patient ID: Linda ConteRachel Chambers, female   DOB: 09/30/1995, 26 y.o.   MRN: [id number]  This visit type was conducted due to national recommendations for restrictions regarding the COVID-19 Pandemic (e.g. social distancing) in an effort to limit this patient's exposure and mitigate transmission in our community.  Due to their co-morbid illnesses, this patient is at least at moderate risk for complications without adequate follow up.  This format is felt to be most appropriate for this patient at this time.    Documentation for virtual audio and video telecommunications through [facility] encounter:  The patient was located at home. The provider was located in the office. The patient did consent to this visit and is aware of possible charges through their insurance for this visit.  The other persons participating in this telemedicine service were none. Time spent on call was 20 minutes and in review of previous records 20 minutes total.  This virtual service is not related to other E/M service within previous 7 days.   HPI Chief Complaint  Patient presents with   Covid Positive    Dizziness, congestion covid positive. Woke up yesterday with symptoms.    Virtual consult for covid illness.   Has never had covid before.  Did a home test yesterday that was positive.  Started with symptoms yesterday including dizziness, chills, fever, body aches , whole body cold feeling, head congestion, sore throat, headaches.   Has some cough.  No NVD.  No change in smell or taste.  Fever up to 100.4.  some chest tightness, but no wheezing, no SOB.    Has inhaler, hx/o asthma, not currently having to use inhaler.   Using tylenol  She is concerned about recurrent infection.  Tends to get sick a lot.  In the past year or 2 has had numerous types of infections including urinary tract infections, colds, recent unexplained abdominal pain.  Past Medical History:  Diagnosis Date   Allergic rhinitis     Anxiety    Asthma    ?excercise only   Bipolar 1 disorder (HCC)    Hypercholesteremia    IBS (irritable bowel syndrome)    OSA (obstructive sleep apnea) 02/02/2019   Ovarian cyst    Current Outpatient Medications on File Prior to Visit  Medication Sig Dispense Refill   cetirizine (ZYRTEC) 10 MG tablet Take 1 tablet by mouth at bedtime.     Cyanocobalamin (VITAMIN B-12 PO) Take 1 tablet by mouth at bedtime.     Galcanezumab-gnlm (EMGALITY) 120 MG/ML SOAJ Inject 120 mg into the skin every 30 (thirty) days. 1.12 mL 1   HYDROcodone-acetaminophen (NORCO) 5-325 MG tablet Take 1 tablet by mouth every 6 (six) hours as needed. 12 tablet 0   icosapent Ethyl (VASCEPA) 1 g capsule Take 2 capsules (2 g total) by mouth 2 (two) times daily. 240 capsule 2   JUNEL FE 1/20 1-20 MG-MCG tablet TAKE 1 TABLET BY MOUTH EVERY DAY 84 tablet 3   LORazepam (ATIVAN) 0.5 MG tablet Take 0.5 mg by mouth daily as needed.     metFORMIN (GLUCOPHAGE) 500 MG tablet Take one tablet by mouth daily for one week. Then increase to one tablet twice a day for one week.  Then two tablets twice a day. 60 tablet 12   Multiple Vitamin (MULTIVITAMIN) tablet Take 1 tablet by mouth at bedtime.     Rimegepant Sulfate (NURTEC) 75 MG TBDP Take 1 tablet by mouth daily as needed. 30 tablet 2  topiramate (TOPAMAX) 25 MG tablet TAKE 1 TABLET BY MOUTH TWICE A DAY 60 tablet 2   VRAYLAR 1.5 MG capsule Take 1.5 mg by mouth daily.     No current facility-administered medications on file prior to visit.    Review of Systems As in subjective    Objective:   Physical Exam Due to coronavirus pandemic stay at home measures, patient visit was virtual and they were not examined in person.   Wt 250 lb (113.4 kg)    BMI 42.91 kg/m   General: White female, no acute distress No labored breathing or wheezing Mildly ill-appearing today     Assessment:     Encounter Diagnoses  Name Primary?   Abnormal blood cell count Yes   Recurrent infections     COVID-19 virus infection    Fever and chills    Mild intermittent asthma without complication    OSA (obstructive sleep apnea)    BMI 40.0-44.9, adult (HCC)    Chronic abdominal pain        Plan:     We discussed her COVID-positive illness, current symptoms, treatment recommendations as below.  General recommendations: I recommend you rest, hydrate well with water and clear fluids throughout the day.    Risk factors for complication include obesity, asthma, recurrent prior infections.  Begin Paxlovid.  Discussed risk and benefits of medication.  You can use Tylenol for pain or fever You can use over the counter Delsym for cough. You can use over the counter Emetrol for nausea.     If you are having trouble breathing, if you are very weak, have high fever 103 or higher consistently despite Tylenol, or uncontrollable nausea and vomiting, then call or go to the emergency department.    If you have other questions or have other symptoms or questions you are concerned about then please make a virtual visit  Covid symptoms such as fatigue and cough can linger over 2 weeks, even after the initial fever, aches, chills, and other initial symptoms.   Self Quarantine: The CDC, Centers for Disease Control has recommended a self quarantine of 5 days from the start of your illness until you are symptom-free including at least 24 hours of no symptoms including no fever, no shortness of breath, and no body aches and chills, by day 5 before returning to work or general contact with the public.  What does self quarantine mean: avoiding contact with people as much as possible.   Particularly in your house, isolate your self from others in a separate room, wear a mask when possible in the room, particularly if coughing a lot.   Have others bring food, water, medications, etc., to your door, but avoid direct contact with your household contacts during this time to avoid spreading the infection to  them.   If you have a separate bathroom and living quarters during the next 2 weeks away from others, that would be preferable.    If you can't completely isolate, then wear a mask, wash hands frequently with soap and water for at least 15 seconds, minimize close contact with others, and have a friend or family member check regularly from a distance to make sure you are not getting seriously worse.     You should not be going out in public, should not be going to stores, to work or other public places until all your symptoms have resolved and at least 5 days + 24 hours of no symptoms at all have transpired.  Ideally you should avoid contact with others for a full 5 days if possible.  One of the goals is to limit spread to high risk people; people that are older and elderly, people with multiple health issues like diabetes, heart disease, lung disease, and anybody that has weakened immune systems such as people with cancer or on immunosuppressive therapy.   Given history of recurrent infections, abnormal white and red counts over the past couple years, referral to hematology for further consult.  I reviewed her recent gynecology notes, recent labs and imaging in the electronic chart record.  They felt like her chronic pain is more related to IBS.  She is supposed to follow-up with gastroenterology soon.    Machel was seen today for covid positive.  Diagnoses and all orders for this visit:  Abnormal blood cell count -     Ambulatory referral to Hematology / Oncology  Recurrent infections -     Ambulatory referral to Hematology / Oncology  COVID-19 virus infection  Fever and chills  Mild intermittent asthma without complication  OSA (obstructive sleep apnea)  BMI 40.0-44.9, adult (HCC)  Chronic abdominal pain  Other orders -     nirmatrelvir/ritonavir EUA, renal dosing, (PAXLOVID) 10 x 150 MG & 10 x 100MG  TABS; Take 2 tablets by mouth 2 (two) times daily for 5 days. (Take nirmatrelvir  150 mg one tablet twice daily for 5 days and ritonavir 100 mg one tablet twice daily for 5 days) Patient GFR is >60 -     Multiple Vitamins-Minerals (EMERGEN-C IMMUNE PLUS) PACK; Take 1 tablet by mouth 2 (two) times daily. -     albuterol (VENTOLIN HFA) 108 (90 Base) MCG/ACT inhaler; TAKE 2 PUFFS BY MOUTH EVERY 6 HOURS AS NEEDED FOR WHEEZE OR SHORTNESS OF BREATH  F/u pending hematology consult

## 2021-05-07 ENCOUNTER — Telehealth: Payer: BLUE CROSS/BLUE SHIELD | Admitting: Medical

## 2021-05-17 ENCOUNTER — Encounter: Payer: Self-pay | Admitting: *Deleted

## 2021-05-20 ENCOUNTER — Encounter: Payer: Self-pay | Admitting: Internal Medicine

## 2021-05-20 ENCOUNTER — Inpatient Hospital Stay: Payer: BLUE CROSS/BLUE SHIELD

## 2021-05-20 ENCOUNTER — Inpatient Hospital Stay: Payer: BLUE CROSS/BLUE SHIELD | Attending: Internal Medicine | Admitting: Internal Medicine

## 2021-05-20 ENCOUNTER — Other Ambulatory Visit: Payer: Self-pay

## 2021-05-20 VITALS — BP 118/95 | HR 100 | Temp 99.6°F | Ht 64.0 in | Wt 250.4 lb

## 2021-05-20 DIAGNOSIS — Z7984 Long term (current) use of oral hypoglycemic drugs: Secondary | ICD-10-CM | POA: Diagnosis not present

## 2021-05-20 DIAGNOSIS — D72820 Lymphocytosis (symptomatic): Secondary | ICD-10-CM

## 2021-05-20 DIAGNOSIS — D75839 Thrombocytosis, unspecified: Secondary | ICD-10-CM

## 2021-05-20 DIAGNOSIS — R799 Abnormal finding of blood chemistry, unspecified: Secondary | ICD-10-CM

## 2021-05-20 DIAGNOSIS — E669 Obesity, unspecified: Secondary | ICD-10-CM | POA: Diagnosis not present

## 2021-05-20 DIAGNOSIS — Z79899 Other long term (current) drug therapy: Secondary | ICD-10-CM | POA: Diagnosis not present

## 2021-05-20 DIAGNOSIS — K760 Fatty (change of) liver, not elsewhere classified: Secondary | ICD-10-CM | POA: Diagnosis not present

## 2021-05-20 DIAGNOSIS — N83201 Unspecified ovarian cyst, right side: Secondary | ICD-10-CM | POA: Diagnosis not present

## 2021-05-20 DIAGNOSIS — E668 Other obesity: Secondary | ICD-10-CM | POA: Insufficient documentation

## 2021-05-20 LAB — TECHNOLOGIST SMEAR REVIEW: Plt Morphology: NORMAL

## 2021-05-20 LAB — CBC WITH DIFFERENTIAL/PLATELET
Abs Immature Granulocytes: 0.03 10*3/uL (ref 0.00–0.07)
Basophils Absolute: 0 10*3/uL (ref 0.0–0.1)
Basophils Relative: 0 %
Eosinophils Absolute: 0.2 10*3/uL (ref 0.0–0.5)
Eosinophils Relative: 1 %
HCT: 40.3 % (ref 36.0–46.0)
Hemoglobin: 13.2 g/dL (ref 12.0–15.0)
Immature Granulocytes: 0 %
Lymphocytes Relative: 36 %
Lymphs Abs: 4 10*3/uL (ref 0.7–4.0)
MCH: 24.5 pg — ABNORMAL LOW (ref 26.0–34.0)
MCHC: 32.8 g/dL (ref 30.0–36.0)
MCV: 74.8 fL — ABNORMAL LOW (ref 80.0–100.0)
Monocytes Absolute: 0.7 10*3/uL (ref 0.1–1.0)
Monocytes Relative: 6 %
Neutro Abs: 6.4 10*3/uL (ref 1.7–7.7)
Neutrophils Relative %: 57 %
Platelets: 402 10*3/uL — ABNORMAL HIGH (ref 150–400)
RBC: 5.39 MIL/uL — ABNORMAL HIGH (ref 3.87–5.11)
RDW: 16.1 % — ABNORMAL HIGH (ref 11.5–15.5)
WBC: 11.3 10*3/uL — ABNORMAL HIGH (ref 4.0–10.5)
nRBC: 0 % (ref 0.0–0.2)

## 2021-05-20 LAB — COMPREHENSIVE METABOLIC PANEL
ALT: 49 U/L — ABNORMAL HIGH (ref 0–44)
AST: 31 U/L (ref 15–41)
Albumin: 4 g/dL (ref 3.5–5.0)
Alkaline Phosphatase: 71 U/L (ref 38–126)
Anion gap: 11 (ref 5–15)
BUN: 13 mg/dL (ref 6–20)
CO2: 21 mmol/L — ABNORMAL LOW (ref 22–32)
Calcium: 9.2 mg/dL (ref 8.9–10.3)
Chloride: 103 mmol/L (ref 98–111)
Creatinine, Ser: 0.69 mg/dL (ref 0.44–1.00)
GFR, Estimated: >60 mL/min (ref >60)
Glucose, Bld: 125 mg/dL — ABNORMAL HIGH (ref 70–99)
Potassium: 3.5 mmol/L (ref 3.5–5.1)
Sodium: 135 mmol/L (ref 135–145)
Total Bilirubin: 0.2 mg/dL — ABNORMAL LOW (ref 0.3–1.2)
Total Protein: 7.5 g/dL (ref 6.5–8.1)

## 2021-05-20 LAB — LACTATE DEHYDROGENASE: LDH: 122 U/L (ref 98–192)

## 2021-05-20 LAB — IRON AND TIBC
Iron: 38 ug/dL (ref 28–170)
Saturation Ratios: 8 % — ABNORMAL LOW (ref 10.4–31.8)
TIBC: 454 ug/dL — ABNORMAL HIGH (ref 250–450)
UIBC: 416 ug/dL

## 2021-05-20 LAB — FERRITIN: Ferritin: 67 ng/mL (ref 11–307)

## 2021-05-20 NOTE — Assessment & Plan Note (Addendum)
#  Nonspecific abnormal blood counts-thrombocytosis; mild leukocytosis/lymphocytosis; microcytosis without anemia-in the context of recurrent "infections".  Less concerning for malignancy however malignancy/MPN needs to be ruled out.  #Check CBC CMP LDH CRP; peripheral blood flow cytometry; review of smear; BCR-ABL FISH; JAK2/MPL CAL mutations; quantitative immunoglobulins.  #Frequent "infections" /sinusitis-persistent to infection versus allergic.  Await above work-up; if negative would recommend ENT evaluation.  # Obesity/OSA-- on Topomax/ Metformin-   Thank you Dr. for allowing me to participate in the care of your pleasant patient. Please do not hesitate to contact me with questions or concerns in the interim.  # DISPOSITION: # labs today # follow up in 2-3 weeks- MD: No labs- dr.B
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
## 2021-05-22 ENCOUNTER — Ambulatory Visit: Payer: BLUE CROSS/BLUE SHIELD | Admitting: Obstetrics & Gynecology

## 2021-05-22 LAB — COMP PANEL: LEUKEMIA/LYMPHOMA

## 2021-05-22 LAB — C-REACTIVE PROTEIN: CRP: 1.6 mg/dL — ABNORMAL HIGH (ref <1.0)

## 2021-05-23 LAB — BCR-ABL1 FISH
Cells Analyzed: 200
Cells Counted: 200

## 2021-05-25 LAB — IMMUNOGLOBULINS A/E/G/M, SERUM
IgA: 101 mg/dL (ref 87–352)
IgE (Immunoglobulin E), Serum: 73 IU/mL (ref 6–495)
IgG (Immunoglobin G), Serum: 1147 mg/dL (ref 586–1602)
IgM (Immunoglobulin M), Srm: 158 mg/dL (ref 26–217)

## 2021-05-26 LAB — JAK2 GENOTYPR

## 2021-05-29 ENCOUNTER — Ambulatory Visit: Payer: BLUE CROSS/BLUE SHIELD | Admitting: Gastroenterology

## 2021-05-29 LAB — CALRETICULIN (CALR) MUTATION ANALYSIS

## 2021-05-29 LAB — MPL MUTATION ANALYSIS

## 2021-06-10 ENCOUNTER — Telehealth: Payer: Self-pay | Admitting: Medical

## 2021-06-10 ENCOUNTER — Inpatient Hospital Stay (HOSPITAL_BASED_OUTPATIENT_CLINIC_OR_DEPARTMENT_OTHER): Payer: BLUE CROSS/BLUE SHIELD | Admitting: Internal Medicine

## 2021-06-10 ENCOUNTER — Encounter: Payer: Self-pay | Admitting: Internal Medicine

## 2021-06-10 ENCOUNTER — Other Ambulatory Visit: Payer: Self-pay

## 2021-06-10 DIAGNOSIS — R799 Abnormal finding of blood chemistry, unspecified: Secondary | ICD-10-CM

## 2021-06-10 DIAGNOSIS — D75839 Thrombocytosis, unspecified: Secondary | ICD-10-CM | POA: Diagnosis not present

## 2021-06-10 NOTE — Progress Notes (Signed)
Results of labs.   C/O tingling in her hands, arms and knees comes and goes feels like pens and needles.  Knees and back hurt constantly.  Losing weight, states she's not trying and not sure why.Can metformin or topamax cause wt lose? These are newer for her.  Having chest pain/ache that comes and goes for a few weeks now.   Has had 2 covid shots and booster, got covid in Jan 2023, is it recommended to get another booster?

## 2021-06-10 NOTE — Telephone Encounter (Signed)
Pt called and states that she saw Dr. Donneta Romberg and he advised forwarding notes to [facility] for an appointment she had today. Per those notes pt needs a referral to Rheumatology. Pt wants to know if she will need an appt here or if you will refer her based on his notes. Pt can be reached at [phone].

## 2021-06-10 NOTE — Assessment & Plan Note (Addendum)
#  Nonspecific abnormal blood counts-thrombocytosis; mild leukocytosis/lymphocytosis; microcytosis without anemia-in the context of recurrent "infections"-MPN work-up negative including BCR-ABL; flow cytometry negative.  Discussed regarding possible bone marrow biopsy.  However I do not think a bone marrow biopsy would help give more information.  Patient was counseled that if her counts get worse/her PCP is concerned we will be happy to reevaluate her.  #Microcytosis without anemia-question thalassemia trait-iron saturation 8%; ferritin 64-do not suspect any significant iron deficiency. ?  Chronic inflammation-do not recommend any further work-up at this time-unless patient develops anemia/worsening iron deficiency.  #Frequent "infections" /sinusitis-persistent to infection versus allergic- Quantitative immunoglobulins; Defer to PCP re:  ENT evaluation/referral  # Obesity/OSA-- on Topomax/ Metformin-causing weight loss.  # Joint pain- "all over"/ Bil UE tingling/numbness-/?CTS- CRP -1.6? Rheumatology referral to PCP.   # DISPOSITION: # follow up as needed- Dr.B  Cc;Tysinger, [employer]
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
## 2021-06-13 ENCOUNTER — Telehealth: Payer: Self-pay

## 2021-06-13 DIAGNOSIS — R899 Unspecified abnormal finding in specimens from other organs, systems and tissues: Secondary | ICD-10-CM

## 2021-06-13 NOTE — Telephone Encounter (Signed)
Pt is requesting a referral to a rheumatologist. Per her visit with Dr. Donneta Romberg on 06/10/21. Pt labs were abnormal please advise [facility] ?

## 2021-06-14 NOTE — Telephone Encounter (Signed)
I have put in referral 

## 2021-06-21 ENCOUNTER — Ambulatory Visit (INDEPENDENT_AMBULATORY_CARE_PROVIDER_SITE_OTHER): Payer: BLUE CROSS/BLUE SHIELD | Admitting: Medical

## 2021-06-21 ENCOUNTER — Encounter: Payer: Self-pay | Admitting: Medical

## 2021-06-21 ENCOUNTER — Other Ambulatory Visit: Payer: Self-pay

## 2021-06-21 VITALS — BP 126/90 | HR 88 | Temp 98.5°F | Ht 64.5 in | Wt 247.0 lb

## 2021-06-21 DIAGNOSIS — R200 Anesthesia of skin: Secondary | ICD-10-CM | POA: Diagnosis not present

## 2021-06-21 DIAGNOSIS — G8929 Other chronic pain: Secondary | ICD-10-CM

## 2021-06-21 DIAGNOSIS — M25562 Pain in left knee: Secondary | ICD-10-CM

## 2021-06-21 DIAGNOSIS — M25561 Pain in right knee: Secondary | ICD-10-CM

## 2021-06-21 DIAGNOSIS — M25559 Pain in unspecified hip: Secondary | ICD-10-CM | POA: Diagnosis not present

## 2021-06-21 DIAGNOSIS — M79641 Pain in right hand: Secondary | ICD-10-CM | POA: Diagnosis not present

## 2021-06-21 DIAGNOSIS — M79642 Pain in left hand: Secondary | ICD-10-CM

## 2021-06-21 DIAGNOSIS — R7301 Impaired fasting glucose: Secondary | ICD-10-CM
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
## 2021-06-23 LAB — SEDIMENTATION RATE: Sed Rate: 10 mm/hr (ref 0–32)

## 2021-06-23 LAB — VITAMIN B12: Vitamin B-12: 263 pg/mL (ref 232–1245)

## 2021-06-23 LAB — HEMOGLOBIN A1C
Est. average glucose Bld gHb Est-mCnc: 114 mg/dL
Hgb A1c MFr Bld: 5.6 % (ref 4.8–5.6)

## 2021-06-23 LAB — RHEUMATOID FACTOR: Rheumatoid fact SerPl-aCnc: <10 IU/mL (ref <14.0)

## 2021-06-23 LAB — CYCLIC CITRUL PEPTIDE ANTIBODY, IGG/IGA: Cyclic Citrullin Peptide Ab: <1 units (ref 0–19)

## 2021-06-25 ENCOUNTER — Other Ambulatory Visit: Payer: Self-pay | Admitting: Medical

## 2021-07-10 ENCOUNTER — Telehealth: Payer: Self-pay | Admitting: Medical

## 2021-07-10 NOTE — Telephone Encounter (Signed)
Needs appt for work form.  I received work clearance form that will require some questions and labs for varicella and MMR titer, possible hepatitis B titer as well .  We don't have those on file.  She did have tuberculosis screen 08/2020.   Not sure if her employer will take that or not since last year.  We may need to update as well.  She can call employer about using the baseline 5/22 quantiferon tuberculosis test. ? ?

## 2021-07-15 NOTE — Telephone Encounter (Signed)
Pt coming in tomorrow to discuss this with you ?

## 2021-07-15 NOTE — Telephone Encounter (Signed)
See message sent to [facility] last week.  I haven't heard back on this ? ?

## 2021-07-16 ENCOUNTER — Encounter: Payer: Self-pay | Admitting: Medical

## 2021-07-16 ENCOUNTER — Ambulatory Visit (INDEPENDENT_AMBULATORY_CARE_PROVIDER_SITE_OTHER): Payer: BC Managed Care – PPO | Admitting: Medical

## 2021-07-16 VITALS — BP 120/70 | HR 108 | Wt 245.6 lb

## 2021-07-16 DIAGNOSIS — G43909 Migraine, unspecified, not intractable, without status migrainosus: Secondary | ICD-10-CM | POA: Insufficient documentation

## 2021-07-16 DIAGNOSIS — Z6841 Body Mass Index (BMI) 40.0 and over, adult: Secondary | ICD-10-CM

## 2021-07-16 DIAGNOSIS — R42 Dizziness and giddiness: Secondary | ICD-10-CM

## 2021-07-16 DIAGNOSIS — Z111 Encounter for screening for respiratory tuberculosis: Secondary | ICD-10-CM

## 2021-07-16 DIAGNOSIS — R7301 Impaired fasting glucose: Secondary | ICD-10-CM

## 2021-07-16 DIAGNOSIS — F419 Anxiety disorder, unspecified: Secondary | ICD-10-CM | POA: Diagnosis not present

## 2021-07-16 DIAGNOSIS — Z139 Encounter for screening, unspecified: Secondary | ICD-10-CM

## 2021-07-16 DIAGNOSIS — F3181 Bipolar II disorder: Secondary | ICD-10-CM

## 2021-07-16 DIAGNOSIS — Z021 Encounter for pre-employment examination: Secondary | ICD-10-CM | POA: Diagnosis not present

## 2021-07-16 DIAGNOSIS — R200 Anesthesia of skin: Secondary | ICD-10-CM | POA: Insufficient documentation
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
## 2021-07-17 LAB — HEPATITIS B SURFACE ANTIBODY, QUANTITATIVE: Hepatitis B Surf Ab Quant: 79.6 m[IU]/mL (ref >9.9)

## 2021-07-17 LAB — VARICELLA ZOSTER ANTIBODY, IGG: Varicella zoster IgG: 639 index (ref >165)

## 2021-07-17 LAB — MEASLES/MUMPS/RUBELLA IMMUNITY
MUMPS ABS, IGG: >300 AU/mL (ref >10.9)
RUBEOLA AB, IGG: >300 AU/mL (ref >16.4)
Rubella Antibodies, IGG: 13.5 index (ref >0.99)

## 2021-07-19 LAB — QUANTIFERON-TB GOLD PLUS
QuantiFERON Mitogen Value: >10 IU/mL
QuantiFERON Nil Value: 0.04 IU/mL
QuantiFERON TB1 Ag Value: 0.03 IU/mL
QuantiFERON TB2 Ag Value: 0.02 IU/mL
QuantiFERON-TB Gold Plus: NEGATIVE

## 2021-07-23 ENCOUNTER — Encounter: Payer: Self-pay | Admitting: Physician Assistant

## 2021-07-23 ENCOUNTER — Ambulatory Visit (INDEPENDENT_AMBULATORY_CARE_PROVIDER_SITE_OTHER): Payer: BC Managed Care – PPO | Admitting: Physician Assistant

## 2021-07-23 VITALS — BP 130/80 | HR 103 | Wt 246.4 lb

## 2021-07-23 DIAGNOSIS — R200 Anesthesia of skin: Secondary | ICD-10-CM

## 2021-07-23 DIAGNOSIS — R202 Paresthesia of skin: Secondary | ICD-10-CM

## 2021-07-23 DIAGNOSIS — F419 Anxiety disorder, unspecified: Secondary | ICD-10-CM

## 2021-07-23 DIAGNOSIS — R42 Dizziness and giddiness: Secondary | ICD-10-CM

## 2021-07-23 DIAGNOSIS — R7303 Prediabetes: Secondary | ICD-10-CM

## 2021-07-23 NOTE — Patient Instructions (Addendum)
You can take OTC Vitamin B 12 daily ? ?You will get a call to schedule an appointment with Neurology ? ?

## 2021-07-23 NOTE — Progress Notes (Signed)
? ?Acute Office Visit ? ?Subjective:  ? ? Patient ID: Linda ConteRachel Fairburn, female    DOB: 07/30/1995, 26 y.o.   MRN: [id number] ? ?Chief Complaint  ?Patient presents with  ? Acute Visit  ?  Normally sees [facility]. Numbness and tingling in both arms and dizziness. Numbness in hands has been going on for awhile but the arms just started today.  ? ? ?HPI ?Patient is in today, with her Husband, because she reports that the numbness in her hands is now moving up to her arms, worse when her arms are hanging down; is causing her anxiety; last office visit on 07/16/2021 she reported dizziness and a Neurology referral was discussed; 06/21/2021 office visit she reported finger numbness and labs were drawn that revealed a low normal vitamin b 12 level at 263, normal sed rate 10, normal Hgb a1c 5.6; denies any new activities, injury, or falls.   ? ?Outpatient Medications Prior to Visit  ?Medication Sig Dispense Refill  ? albuterol (VENTOLIN HFA) 108 (90 Base) MCG/ACT inhaler TAKE 2 PUFFS BY MOUTH EVERY 6 HOURS AS NEEDED FOR WHEEZE OR SHORTNESS OF BREATH 8.5 each 1  ? busPIRone (BUSPAR) 5 MG tablet Take 5 mg by mouth 2 (two) times daily.    ? cetirizine (ZYRTEC) 10 MG tablet Take 1 tablet by mouth daily.    ? Cyanocobalamin (VITAMIN B-12 PO) Take 1 tablet by mouth at bedtime.    ? JUNEL FE 1/20 1-20 MG-MCG tablet TAKE 1 TABLET BY MOUTH EVERY DAY 84 tablet 3  ? metFORMIN (GLUCOPHAGE) 500 MG tablet Take one tablet by mouth daily for one week. Then increase to one tablet twice a day for one week.  Then two tablets twice a day. (Patient taking differently: Take 500 mg by mouth in the morning and at bedtime.) 60 tablet 12  ? Rimegepant Sulfate (NURTEC) 75 MG TBDP Take 1 tablet by mouth daily as needed. 30 tablet 2  ? topiramate (TOPAMAX) 25 MG tablet TAKE 1 TABLET BY MOUTH TWICE A DAY 180 tablet 0  ? VRAYLAR 1.5 MG capsule Take 1.5 mg by mouth daily.    ? LORazepam (ATIVAN) 0.5 MG tablet Take 0.5 mg by mouth daily as needed. (Patient not taking:  Reported on 07/23/2021)    ? Multiple Vitamin (MULTIVITAMIN) tablet Take 1 tablet by mouth at bedtime. (Patient not taking: Reported on 07/23/2021)    ? ?No facility-administered medications prior to visit.  ? ? ?Allergies  ?Allergen Reactions  ? Ozempic (0.25 Or 0.5 Mg-Dose) [Semaglutide(0.25 Or 0.5mg -Dos)]   ?  Nausea and vomiting, severe  ? Semaglutide   ?  Nausea and vomiting, severe  ? ? ?Review of Systems  ?Constitutional:  Negative for activity change and chills.  ?HENT:  Negative for congestion and voice change.   ?Eyes:  Negative for pain and redness.  ?Respiratory:  Negative for cough and wheezing.   ?Cardiovascular:  Negative for chest pain.  ?Gastrointestinal:  Negative for constipation, diarrhea, nausea and vomiting.  ?Endocrine: Negative for polyuria.  ?Genitourinary:  Negative for frequency.  ?Skin:  Negative for color change and rash.  ?Allergic/Immunologic: Negative for immunocompromised state.  ?Neurological:  Positive for numbness. Negative for dizziness.  ?Psychiatric/Behavioral:  Negative for agitation.   ? ?   ?Objective:  ?  ?Physical Exam ?Vitals and nursing note reviewed.  ?Constitutional:   ?   General: She is not in acute distress. ?   Appearance: Normal appearance. She is not ill-appearing.  ?HENT:  ?  Head: Normocephalic and atraumatic.  ?   Right Ear: External ear normal.  ?   Left Ear: External ear normal.  ?   Nose: No congestion.  ?Eyes:  ?   Extraocular Movements: Extraocular movements intact.  ?   Conjunctiva/sclera: Conjunctivae normal.  ?   Pupils: Pupils are equal, round, and reactive to light.  ?Cardiovascular:  ?   Rate and Rhythm: Normal rate and regular rhythm.  ?   Pulses: Normal pulses.  ?   Heart sounds: Normal heart sounds.  ?Pulmonary:  ?   Effort: Pulmonary effort is normal.  ?   Breath sounds: Normal breath sounds. No wheezing.  ?Abdominal:  ?   General: Bowel sounds are normal.  ?   Palpations: Abdomen is soft.  ?Musculoskeletal:     ?   General: Normal range of  motion.  ?   Right shoulder: Normal. No tenderness. Normal range of motion. Normal pulse.  ?   Left shoulder: Normal. No tenderness. Normal range of motion. Normal pulse.  ?   Right upper arm: Normal. No tenderness.  ?   Left upper arm: Normal. No tenderness.  ?   Right hand: Normal. No swelling. Normal range of motion. Normal pulse.  ?   Left hand: Normal. No swelling. Normal range of motion. Normal pulse.  ?   Cervical back: Normal range of motion and neck supple.  ?   Right lower leg: No edema.  ?   Left lower leg: No edema.  ?Skin: ?   General: Skin is warm and dry.  ?   Findings: No bruising.  ?Neurological:  ?   General: No focal deficit present.  ?   Mental Status: She is alert and oriented to person, place, and time.  ?Psychiatric:     ?   Mood and Affect: Mood normal.     ?   Behavior: Behavior normal.     ?   Thought Content: Thought content normal.  ? ? ?BP 130/80   Pulse (!) 103   Wt 246 lb 6.4 oz (111.8 kg)   SpO2 100%   BMI 41.64 kg/m?  ? ?Wt Readings from Last 3 Encounters:  ?07/23/21 246 lb 6.4 oz (111.8 kg)  ?07/16/21 245 lb 9.6 oz (111.4 kg)  ?06/21/21 247 lb (112 kg)  ? ? ?Results for orders placed or performed in visit on 07/16/21  ?Varicella zoster antibody, IgG  ?Result Value Ref Range  ? Varicella zoster IgG 639 Immune >165 index  ?Hepatitis B surface antibody,quantitative  ?Result Value Ref Range  ? Hepatitis B Surf Ab Quant 79.6 Immunity>9.9 mIU/mL  ?Measles/Mumps/Rubella Immunity  ?Result Value Ref Range  ? Rubella Antibodies, IGG 13.50 Immune >0.99 index  ? RUBEOLA AB, IGG >300.0 Immune >16.4 AU/mL  ? MUMPS ABS, IGG >300.0 Immune >10.9 AU/mL  ?QuantiFERON-TB Gold Plus  ?Result Value Ref Range  ? QuantiFERON Incubation Incubation performed.   ? QuantiFERON Criteria Comment   ? QuantiFERON TB1 Ag Value 0.03 IU/mL  ? QuantiFERON TB2 Ag Value 0.02 IU/mL  ? QuantiFERON Nil Value 0.04 IU/mL  ? QuantiFERON Mitogen Value >10.00 IU/mL  ? QuantiFERON-TB Gold Plus Negative Negative  ? ? ?    ?Assessment & Plan:  ?1. Numbness and tingling ?- Ambulatory referral to Neurology ?- take OTC vitamin B complex  ? ?2. Dizziness ?- Ambulatory referral to Neurology ?- stay hydrated ? ?3. Prediabetes ?- controlled, eat a low sugar diet, avoid starchy food with a lot of carbohydrates, avoid fried  and processed foods ? ?4. Anxiety ?- stable ? ? ? ?No orders of the defined types were placed in this encounter. ? ? ?Return in about 3 months (around 10/22/2021) for Return for Annual Exam. ? ?Jake Shark, PA-C ?

## 2021-07-24 ENCOUNTER — Encounter: Payer: Self-pay | Admitting: Physician Assistant

## 2021-07-24 DIAGNOSIS — R7303 Prediabetes: Secondary | ICD-10-CM | POA: Insufficient documentation

## 2021-07-24 DIAGNOSIS — E237 Disorder of pituitary gland, unspecified: Secondary | ICD-10-CM | POA: Insufficient documentation

## 2021-07-25 ENCOUNTER — Emergency Department
Admission: EM | Admit: 2021-07-25 | Discharge: 2021-07-25 | Disposition: A | Discharge status: Home / Self Care | Payer: BC Managed Care – PPO | Attending: Emergency Medicine | Admitting: Emergency Medicine

## 2021-07-25 ENCOUNTER — Telehealth: Payer: Self-pay | Admitting: Internal Medicine

## 2021-07-25 ENCOUNTER — Emergency Department: Payer: BC Managed Care – PPO

## 2021-07-25 ENCOUNTER — Other Ambulatory Visit: Payer: Self-pay

## 2021-07-25 DIAGNOSIS — J45909 Unspecified asthma, uncomplicated: Secondary | ICD-10-CM | POA: Diagnosis not present

## 2021-07-25 DIAGNOSIS — R202 Paresthesia of skin: Secondary | ICD-10-CM | POA: Insufficient documentation

## 2021-07-25 DIAGNOSIS — R42 Dizziness and giddiness: Secondary | ICD-10-CM | POA: Diagnosis not present

## 2021-07-25 DIAGNOSIS — H53149 Visual discomfort, unspecified: Secondary | ICD-10-CM | POA: Insufficient documentation

## 2021-07-25 DIAGNOSIS — R110 Nausea: Secondary | ICD-10-CM | POA: Insufficient documentation

## 2021-07-25 DIAGNOSIS — R519 Headache, unspecified: Secondary | ICD-10-CM | POA: Diagnosis present

## 2021-07-25 LAB — BASIC METABOLIC PANEL
Anion gap: 8 (ref 5–15)
BUN: 11 mg/dL (ref 6–20)
CO2: 29 mmol/L (ref 22–32)
Calcium: 9.3 mg/dL (ref 8.9–10.3)
Chloride: 104 mmol/L (ref 98–111)
Creatinine, Ser: 0.74 mg/dL (ref 0.44–1.00)
GFR, Estimated: >60 mL/min (ref >60)
Glucose, Bld: 105 mg/dL — ABNORMAL HIGH (ref 70–99)
Potassium: 3.7 mmol/L (ref 3.5–5.1)
Sodium: 141 mmol/L (ref 135–145)

## 2021-07-25 MED ORDER — PROCHLORPERAZINE EDISYLATE 10 MG/2ML IJ SOLN
10.0000 mg | INTRAMUSCULAR | Status: AC
  Administered 2021-07-25: 10 mg via INTRAVENOUS
  Filled 2021-07-25: qty 2

## 2021-07-25 MED ORDER — IOHEXOL 350 MG/ML SOLN
75.0000 mL | Freq: Once | INTRAVENOUS | Status: AC | PRN
  Administered 2021-07-25: 75 mL via INTRAVENOUS

## 2021-07-25 MED ORDER — MAGNESIUM SULFATE 2 GM/50ML IV SOLN
2.0000 g | Freq: Once | INTRAVENOUS | Status: AC
Start: 2021-07-25 — End: 2021-07-25
  Administered 2021-07-25: 2 g via INTRAVENOUS
  Filled 2021-07-25: qty 50

## 2021-07-25 MED ORDER — KETOROLAC TROMETHAMINE 30 MG/ML IJ SOLN
15.0000 mg | Freq: Once | INTRAMUSCULAR | Status: AC
  Administered 2021-07-25: 15 mg via INTRAVENOUS
  Filled 2021-07-25: qty 1

## 2021-07-25 MED ORDER — LACTATED RINGERS IV BOLUS
1000.0000 mL | Freq: Once | INTRAVENOUS | Status: AC
Start: 2021-07-25 — End: 2021-07-25
  Administered 2021-07-25: 1000 mL via INTRAVENOUS

## 2021-07-25 NOTE — Telephone Encounter (Signed)
Pt called and states that she has had a migraine for 48 hours and nothing is helping. She states she mentioned this at the appointment the other day but her main concern was the tingling so I did not see anything mentioned in notes about migraine. She has taken Nurtec with no relief and she took ibuprofen with no relief. She is having neck pain and very nausea and feels like she is off today. She is concerned and doesn't know what she needs to do ?
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
## 2021-07-25 NOTE — Telephone Encounter (Signed)
Pt was notified.  

## 2021-07-25 NOTE — Telephone Encounter (Signed)
If she has that severe of a headache then she has to go to the Emergency Department.

## 2021-07-25 NOTE — ED Triage Notes (Signed)
Pt c/o migraine for the past 48 hrs and has taken her rx meds with no relief, pt is in NAD on arrival ?

## 2021-07-25 NOTE — Discharge Instructions (Signed)
Please keep your scheduled neurology follow-up appointment. ?

## 2021-08-02 ENCOUNTER — Other Ambulatory Visit: Payer: Self-pay | Admitting: Obstetrics & Gynecology

## 2021-08-09 ENCOUNTER — Ambulatory Visit (INDEPENDENT_AMBULATORY_CARE_PROVIDER_SITE_OTHER): Payer: BC Managed Care – PPO | Admitting: Neurology

## 2021-08-09 ENCOUNTER — Encounter: Payer: Self-pay | Admitting: Neurology

## 2021-08-09 VITALS — BP 129/86 | HR 94 | Ht 64.0 in | Wt 246.0 lb

## 2021-08-09 DIAGNOSIS — G43709 Chronic migraine without aura, not intractable, without status migrainosus: Secondary | ICD-10-CM | POA: Diagnosis not present

## 2021-08-09 MED ORDER — ONDANSETRON 4 MG PO TBDP: 4.0000 mg | ORAL_TABLET | Freq: Three times a day (TID) | ORAL | 5 refills | Status: AC | PRN

## 2021-08-09 MED ORDER — TOPIRAMATE 50 MG PO TABS: 100.0000 mg | ORAL_TABLET | Freq: Two times a day (BID) | ORAL | 11 refills | Status: DC

## 2021-08-09 MED ORDER — RIZATRIPTAN BENZOATE 10 MG PO TBDP: 10.0000 mg | ORAL_TABLET | ORAL | 11 refills | Status: AC | PRN

## 2021-08-09 NOTE — Patient Instructions (Signed)
Meds ordered this encounter  ?Medications  ? topiramate (TOPAMAX) 50 MG tablet  ?  Sig: Take 2 tablets (100 mg total) by mouth 2 (two) times daily.  ?  Dispense:  120 tablet  ?  Refill:  11  ? rizatriptan (MAXALT-MLT) 10 MG disintegrating tablet  ?  Sig: Take 1 tablet (10 mg total) by mouth as needed for migraine. May repeat in 2 hours if needed  ?  Dispense:  12 tablet  ?  Refill:  11  ? ondansetron (ZOFRAN-ODT) 4 MG disintegrating tablet  ?  Sig: Take 1 tablet (4 mg total) by mouth every 8 (eight) hours as needed for nausea or vomiting.  ?  Dispense:  20 tablet  ?  Refill:  5  ?  aleve ?
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
## 2021-08-12 ENCOUNTER — Other Ambulatory Visit: Payer: Self-pay

## 2021-08-12 ENCOUNTER — Ambulatory Visit: Payer: BC Managed Care – PPO | Admitting: Neurology

## 2021-08-14 ENCOUNTER — Ambulatory Visit: Payer: BLUE CROSS/BLUE SHIELD | Admitting: Gastroenterology

## 2021-09-08 ENCOUNTER — Emergency Department
Admission: EM | Admit: 2021-09-08 | Discharge: 2021-09-08 | Disposition: A | Discharge status: Home / Self Care | Payer: BC Managed Care – PPO | Attending: Emergency Medicine | Admitting: Emergency Medicine

## 2021-09-08 ENCOUNTER — Other Ambulatory Visit: Payer: Self-pay

## 2021-09-08 ENCOUNTER — Emergency Department: Payer: BC Managed Care – PPO

## 2021-09-08 DIAGNOSIS — R079 Chest pain, unspecified: Secondary | ICD-10-CM | POA: Insufficient documentation

## 2021-09-08 DIAGNOSIS — R000 Tachycardia, unspecified: Secondary | ICD-10-CM | POA: Insufficient documentation

## 2021-09-08 DIAGNOSIS — D72829 Elevated white blood cell count, unspecified: Secondary | ICD-10-CM | POA: Diagnosis not present

## 2021-09-08 DIAGNOSIS — J45909 Unspecified asthma, uncomplicated: Secondary | ICD-10-CM | POA: Insufficient documentation

## 2021-09-08 LAB — CBC
HCT: 42.2 % (ref 36.0–46.0)
Hemoglobin: 13.1 g/dL (ref 12.0–15.0)
MCH: 24.2 pg — ABNORMAL LOW (ref 26.0–34.0)
MCHC: 31 g/dL (ref 30.0–36.0)
MCV: 77.9 fL — ABNORMAL LOW (ref 80.0–100.0)
Platelets: 437 10*3/uL — ABNORMAL HIGH (ref 150–400)
RBC: 5.42 MIL/uL — ABNORMAL HIGH (ref 3.87–5.11)
RDW: 16.1 % — ABNORMAL HIGH (ref 11.5–15.5)
WBC: 12.2 10*3/uL — ABNORMAL HIGH (ref 4.0–10.5)
nRBC: 0 % (ref 0.0–0.2)

## 2021-09-08 LAB — POC URINE PREG, ED: Preg Test, Ur: NEGATIVE

## 2021-09-08 LAB — BASIC METABOLIC PANEL
Anion gap: 10 (ref 5–15)
BUN: 10 mg/dL (ref 6–20)
CO2: 19 mmol/L — ABNORMAL LOW (ref 22–32)
Calcium: 9.1 mg/dL (ref 8.9–10.3)
Chloride: 111 mmol/L (ref 98–111)
Creatinine, Ser: 0.57 mg/dL (ref 0.44–1.00)
GFR, Estimated: >60 mL/min (ref >60)
Glucose, Bld: 104 mg/dL — ABNORMAL HIGH (ref 70–99)
Potassium: 3.5 mmol/L (ref 3.5–5.1)
Sodium: 140 mmol/L (ref 135–145)

## 2021-09-08 LAB — D-DIMER, QUANTITATIVE: D-Dimer, Quant: 0.3 ug/mL-FEU (ref 0.00–0.50)

## 2021-09-08 LAB — TROPONIN I (HIGH SENSITIVITY): Troponin I (High Sensitivity): 2 ng/L (ref <18)

## 2021-09-08 MED ORDER — IBUPROFEN 600 MG PO TABS: 600.0000 mg | ORAL_TABLET | Freq: Three times a day (TID) | ORAL | 0 refills | Status: DC | PRN

## 2021-09-08 NOTE — Discharge Instructions (Signed)
As we discussed please use ibuprofen 600 mg every 8 hours as needed for discomfort for up to the next 5 days.  If your chest pain worsens or you develop shortness of breath please return to the emergency department otherwise please follow-up with your doctor in the next 2 days for recheck/reevaluation.

## 2021-09-08 NOTE — ED Triage Notes (Signed)
Pt states central chest pain and left arm pain/tingling since last pm. Pt states pain independent of movement, but is worse when lying down.

## 2021-09-08 NOTE — ED Provider Notes (Signed)
[facility] Provider Note    Event Date/Time   First MD Initiated Contact with Patient 09/08/21 1932     (approximate)  History   Chief Complaint: Chest Pain  HPI  Linda Chambers is a 26 y.o. female with a past medical history of anxiety, asthma, bipolar, presents emergency department for central chest pain.  According to the patient since yesterday she has been experiencing pain in the center of her chest, somewhat worse when she lies down flat.  Denies any shortness of breath nausea or diaphoresis.  No leg pain or swelling.  No pleuritic pain.  No history of cardiac disease.  No family history.  Physical Exam   Triage Vital Signs: ED Triage Vitals [09/08/21 1920]  Enc Vitals Group     BP 120/78     Pulse Rate (!) 106     Resp 16     Temp 98.2 F (36.8 C)     Temp Source Oral     SpO2 99 %     Weight 142 lb (64.4 kg)     Height 5\' 4"  (1.626 m)     Head Circumference      Peak Flow      Pain Score 7     Pain Loc      Pain Edu?      Excl. in GC?     Most recent vital signs: Vitals:   09/08/21 1920  BP: 120/78  Pulse: (!) 106  Resp: 16  Temp: 98.2 F (36.8 C)  SpO2: 99%    General: Awake, no distress.  CV:  Good peripheral perfusion.  Regular rate and rhythm  Resp:  Normal effort.  Equal breath sounds bilaterally.  Abd:  No distention.  Soft, nontender.  No rebound or guarding. Other:  Chest wall is nontender to palpation.  No lower extremity edema or tenderness.   ED Results / Procedures / Treatments   EKG  EKG viewed and interpreted by myself shows a normal sinus rhythm at 94 bpm with a narrow QRS, normal axis, normal intervals, no concerning ST changes.  RADIOLOGY  I personally reviewed the chest x-ray images no acute abnormality seen on my evaluation. Chest x-ray read as negative by radiology.   MEDICATIONS ORDERED IN ED: Medications - No data to display   IMPRESSION / MDM / ASSESSMENT AND PLAN / ED COURSE  I reviewed  the triage vital signs and the nursing notes.  Patient presents emergency department for central chest pain since yesterday worse when she lies flat.  Differential would include ACS, pericarditis, PE, pneumonia.  Patient's labs are reassuring including normal CBC with just a slight leukocytosis, normal chemistry and a negative troponin.  Chest x-ray is clear and EKG reassuring besides mild tachycardia.  We will obtain a D-dimer to help rule out PE.  If D-dimer is negative I believe the patient could be safely discharged home on NSAIDs for possible pericarditis at no no signs on EKG.  Patient agreeable to plan of care.  Patient's D-dimer is negative.  Chemistry is normal.  CBC shows slight leukocytosis otherwise normal.  Troponin negative.  Given the patient's reassuring work-up we will discharge home on NSAIDs for possible pericarditis.  I discussed return precautions.  Patient agreeable to plan of care.  FINAL CLINICAL IMPRESSION(S) / ED DIAGNOSES   Chest pain  Rx / DC Orders   Ibuprofen 600 mg  Note:  This document was prepared using Dragon voice recognition software and may include  unintentional dictation errors.   Minna Antis, MD 09/08/21 2041

## 2021-09-10 ENCOUNTER — Telehealth: Payer: Self-pay | Admitting: Medical

## 2021-09-10 NOTE — Telephone Encounter (Signed)
Called and left message for pt to call concerning her recent re visit. Pt advised follow up with shane is recommended.

## 2021-09-11 ENCOUNTER — Encounter: Payer: Self-pay | Admitting: Physician Assistant

## 2021-09-11 ENCOUNTER — Ambulatory Visit (INDEPENDENT_AMBULATORY_CARE_PROVIDER_SITE_OTHER): Payer: BC Managed Care – PPO | Admitting: Physician Assistant

## 2021-09-11 VITALS — BP 128/80 | HR 111 | Temp 98.8°F | Ht 64.0 in | Wt 244.4 lb

## 2021-09-11 DIAGNOSIS — L989 Disorder of the skin and subcutaneous tissue, unspecified: Secondary | ICD-10-CM

## 2021-09-11 DIAGNOSIS — I319 Disease of pericardium, unspecified: Secondary | ICD-10-CM

## 2021-09-11 DIAGNOSIS — M2550 Pain in unspecified joint: Secondary | ICD-10-CM | POA: Diagnosis not present

## 2021-09-11 NOTE — Progress Notes (Addendum)
Acute Office Visit  Subjective:    Patient ID: Linda ConteRachel Charland, female    DOB: 12/03/1995, 26 y.o.   MRN: [id number]  Chief Complaint  Patient presents with   [facility]     HPI Patient is in today for follow up of chest wall pain and pericarditis; states that the chest wall pain is a little better or the same and she is taking ibuprofen 600 mg with food that is somewhat helpful; reports that she has had skin lesions on her hands and forearms for years and is concerned that she may have an autoimmune disease; also reports multiple joint pains for years and denies any falls or injuries.  Outpatient Medications Prior to Visit  Medication Sig Dispense Refill   albuterol (VENTOLIN HFA) 108 (90 Base) MCG/ACT inhaler TAKE 2 PUFFS BY MOUTH EVERY 6 HOURS AS NEEDED FOR WHEEZE OR SHORTNESS OF BREATH 8.5 each 1   B Complex Vitamins (VITAMIN-B COMPLEX PO) Take by mouth.     busPIRone (BUSPAR) 5 MG tablet Take 5 mg by mouth 2 (two) times daily.     cetirizine (ZYRTEC) 10 MG tablet Take 1 tablet by mouth daily.     ibuprofen (ADVIL) 600 MG tablet Take 1 tablet (600 mg total) by mouth every 8 (eight) hours as needed for moderate pain. 30 tablet 0   metFORMIN (GLUCOPHAGE) 500 MG tablet Take one tablet by mouth daily for one week. Then increase to one tablet twice a day for one week.  Then two tablets twice a day. (Patient taking differently: Take 500 mg by mouth in the morning and at bedtime.) 60 tablet 12   Multiple Vitamin (MULTIVITAMIN) tablet Take 1 tablet by mouth at bedtime.     ondansetron (ZOFRAN-ODT) 4 MG disintegrating tablet Take 1 tablet (4 mg total) by mouth every 8 (eight) hours as needed for nausea or vomiting. 20 tablet 5   rizatriptan (MAXALT-MLT) 10 MG disintegrating tablet Take 1 tablet (10 mg total) by mouth as needed for migraine. May repeat in 2 hours if needed 12 tablet 11   topiramate (TOPAMAX) 50 MG tablet Take 2 tablets (100 mg total) by mouth 2 (two)  times daily. 120 tablet 11   VRAYLAR 1.5 MG capsule Take 1.5 mg by mouth daily.     JUNEL FE 1/20 1-20 MG-MCG tablet TAKE 1 TABLET BY MOUTH EVERY DAY (Patient not taking: Reported on 09/11/2021) 84 tablet 3   Rimegepant Sulfate (NURTEC) 75 MG TBDP Take 1 tablet by mouth daily as needed. (Patient not taking: Reported on 09/11/2021) 30 tablet 2   No facility-administered medications prior to visit.    Allergies  Allergen Reactions   Ozempic (0.25 Or 0.5 Mg-Dose) [Semaglutide(0.25 Or 0.5mg -Dos)]     Nausea and vomiting, severe   Semaglutide     Nausea and vomiting, severe    Review of Systems  Constitutional:  Negative for activity change and chills.  HENT:  Negative for congestion and voice change.   Eyes:  Negative for pain and redness.  Respiratory:  Negative for cough and wheezing.   Cardiovascular:  Negative for chest pain.  Gastrointestinal:  Negative for constipation, diarrhea, nausea and vomiting.  Endocrine: Negative for polyuria.  Genitourinary:  Negative for frequency.  Skin:  Negative for color change and rash.  Allergic/Immunologic: Negative for immunocompromised state.  Neurological:  Negative for dizziness.  Psychiatric/Behavioral:  Negative for agitation.        Objective:    Physical Exam  Vitals and nursing note reviewed.  Constitutional:      General: She is not in acute distress.    Appearance: Normal appearance. She is not ill-appearing.  HENT:     Head: Normocephalic and atraumatic.     Right Ear: External ear normal.     Left Ear: External ear normal.     Nose: No congestion.  Eyes:     Extraocular Movements: Extraocular movements intact.     Conjunctiva/sclera: Conjunctivae normal.     Pupils: Pupils are equal, round, and reactive to light.  Cardiovascular:     Rate and Rhythm: Normal rate and regular rhythm.     Pulses: Normal pulses.     Heart sounds: Normal heart sounds.  Pulmonary:     Effort: Pulmonary effort is normal.     Breath sounds:  Normal breath sounds. No wheezing.  Abdominal:     General: Bowel sounds are normal.     Palpations: Abdomen is soft.  Musculoskeletal:        General: Normal range of motion.     Cervical back: Normal range of motion and neck supple.     Right lower leg: No edema.     Left lower leg: No edema.  Skin:    General: Skin is warm and dry.     Findings: Abrasion present. No bruising.     Comments: Pink linear abrasions on hands and forearms  Neurological:     General: No focal deficit present.     Mental Status: She is alert and oriented to person, place, and time.  Psychiatric:        Mood and Affect: Mood normal.        Behavior: Behavior normal.        Thought Content: Thought content normal.     BP 128/80   Pulse (!) 111   Temp 98.8 F (37.1 C)   Ht 5\' 4"  (1.626 m)   Wt 244 lb 6.4 oz (110.9 kg)   SpO2 99%   BMI 41.95 kg/m   Wt Readings from Last 3 Encounters:  09/17/21 242 lb 12.8 oz (110.1 kg)  09/11/21 244 lb 6.4 oz (110.9 kg)  09/08/21 142 lb (64.4 kg)    Results for orders placed or performed in visit on 09/11/21  CBC with Differential/Platelet  Result Value Ref Range   WBC 12.9 (H) 3.4 - 10.8 x10E3/uL   RBC 5.52 (H) 3.77 - 5.28 x10E6/uL   Hemoglobin 13.5 11.1 - 15.9 g/dL   Hematocrit 09/13/21 63.7 - 46.6 %   MCV 74 (L) 79 - 97 fL   MCH 24.5 (L) 26.6 - 33.0 pg   MCHC 33.0 31.5 - 35.7 g/dL   RDW 85.8 (H) 85.0 - 27.7 %   Platelets 478 (H) 150 - 450 x10E3/uL   Neutrophils 58 Not Estab. %   Lymphs 33 Not Estab. %   Monocytes 7 Not Estab. %   Eos 1 Not Estab. %   Basos 0 Not Estab. %   Neutrophils Absolute 7.5 (H) 1.4 - 7.0 x10E3/uL   Lymphocytes Absolute 4.2 (H) 0.7 - 3.1 x10E3/uL   Monocytes Absolute 0.8 0.1 - 0.9 x10E3/uL   EOS (ABSOLUTE) 0.1 0.0 - 0.4 x10E3/uL   Basophils Absolute 0.0 0.0 - 0.2 x10E3/uL   Immature Granulocytes 1 Not Estab. %   Immature Grans (Abs) 0.1 0.0 - 0.1 x10E3/uL  ANA  Result Value Ref Range   Anti Nuclear Antibody (ANA) Negative  Negative  Assessment & Plan:  1. Multiple joint pain - CBC with Differential/Platelet - ANA - otc Tylenol as needed  2. Skin lesions - Ambulatory referral to Dermatology - stable  3. Pericarditis, unspecified chronicity, unspecified type - stable, continue NSAIDS with food as needed   No orders of the defined types were placed in this encounter.   Return for Return as Already Scheduled.  Jake Shark, PA-C

## 2021-09-11 NOTE — Patient Instructions (Signed)
You will get a call to schedule an appointment with Dermatology.  ?

## 2021-09-12 LAB — CBC WITH DIFFERENTIAL/PLATELET
Basophils Absolute: 0 10*3/uL (ref 0.0–0.2)
Basos: 0 %
EOS (ABSOLUTE): 0.1 10*3/uL (ref 0.0–0.4)
Eos: 1 %
Hematocrit: 40.9 % (ref 34.0–46.6)
Hemoglobin: 13.5 g/dL (ref 11.1–15.9)
Immature Grans (Abs): 0.1 10*3/uL (ref 0.0–0.1)
Immature Granulocytes: 1 %
Lymphocytes Absolute: 4.2 10*3/uL — ABNORMAL HIGH (ref 0.7–3.1)
Lymphs: 33 %
MCH: 24.5 pg — ABNORMAL LOW (ref 26.6–33.0)
MCHC: 33 g/dL (ref 31.5–35.7)
MCV: 74 fL — ABNORMAL LOW (ref 79–97)
Monocytes Absolute: 0.8 10*3/uL (ref 0.1–0.9)
Monocytes: 7 %
Neutrophils Absolute: 7.5 10*3/uL — ABNORMAL HIGH (ref 1.4–7.0)
Neutrophils: 58 %
Platelets: 478 10*3/uL — ABNORMAL HIGH (ref 150–450)
RBC: 5.52 x10E6/uL — ABNORMAL HIGH (ref 3.77–5.28)
RDW: 15.8 % — ABNORMAL HIGH (ref 11.7–15.4)
WBC: 12.9 10*3/uL — ABNORMAL HIGH (ref 3.4–10.8)

## 2021-09-12 LAB — ANA: Anti Nuclear Antibody (ANA): NEGATIVE

## 2021-09-12 NOTE — Telephone Encounter (Signed)
Pt. Stated her flight leaves very early tomorrow morning so if someone could let her know today if possible.

## 2021-09-12 NOTE — Telephone Encounter (Signed)
Pt. Called stating she forgot to mention this to [facility] yesterday at her appt. And her flight leaves tomorrow morning for [place]. I am forwarding to you since Orlie Pollen is out of the office pt. Wanted to know if she should be flying with pericarditis. See mychart message from pt.

## 2021-09-15 NOTE — Telephone Encounter (Signed)
Hi Linda Chambers, Did anyone answer this patient? Thanks

## 2021-09-16 ENCOUNTER — Ambulatory Visit: Payer: BC Managed Care – PPO | Admitting: Family Medicine

## 2021-09-16 ENCOUNTER — Encounter: Payer: Self-pay | Admitting: Internal Medicine

## 2021-09-17 ENCOUNTER — Ambulatory Visit (INDEPENDENT_AMBULATORY_CARE_PROVIDER_SITE_OTHER): Payer: BC Managed Care – PPO | Admitting: Family Medicine

## 2021-09-17 VITALS — BP 126/86 | HR 84 | Temp 97.9°F | Wt 242.8 lb

## 2021-09-17 DIAGNOSIS — G43709 Chronic migraine without aura, not intractable, without status migrainosus: Secondary | ICD-10-CM | POA: Diagnosis not present

## 2021-09-17 DIAGNOSIS — R079 Chest pain, unspecified: Secondary | ICD-10-CM

## 2021-09-17 DIAGNOSIS — F3181 Bipolar II disorder: Secondary | ICD-10-CM

## 2021-09-17 DIAGNOSIS — R42 Dizziness and giddiness: Secondary | ICD-10-CM

## 2021-09-17 NOTE — Progress Notes (Signed)
   Subjective:    Patient ID: Linda Chambers, female    DOB: Oct 07, 1995, 26 y.o.   MRN: [id number]  HPI He is here for a follow-up visit.  She continues have difficulty with chest pain.  She states that when she lies down it gets worse or on either side.  She states that it does tend to get slightly better if she leans forward.  She also complains of dizziness that been going on for a long period of time and also tingling in her hands and feet.  Apparently she is scheduled to see rheumatology later this summer.  She also has a history of bipolar disorder chronic migraines and IBS.   Review of Systems     Objective:   Physical Exam Alert and in no distress.  Cardiac exam shows regular rhythm without murmurs or balance.  She does complain of some slight chest wall tenderness to palpation.  Lungs are clear to auscultation.       Assessment & Plan:  Chest pain, unspecified type  Dizziness  Bipolar 2 disorder (HCC)  Chronic migraine w/o aura, not intractable, w/o stat migr This is a very difficult case and not very clear-cut.  We will start with the below and move forward depending on what kind of symptoms she is having. Take 800 mg of ibuprofen 3 times per day take over-the-counter Prilosec but take 2 of them daily.  Take this for about two weeks and then leave a message

## 2021-09-17 NOTE — Patient Instructions (Signed)
Take 800 mg of ibuprofen 3 times per day take over-the-counter Prilosec but take 2 of them daily.  Take this for about two weeks and then leave a message

## 2021-09-23 ENCOUNTER — Telehealth: Payer: Self-pay

## 2021-09-23 ENCOUNTER — Telehealth: Payer: Self-pay | Admitting: Medical

## 2021-09-23 ENCOUNTER — Other Ambulatory Visit: Payer: Self-pay | Admitting: Medical

## 2021-09-23 MED ORDER — METFORMIN HCL 500 MG PO TABS: 500.0000 mg | ORAL_TABLET | Freq: Two times a day (BID) | ORAL | 0 refills | Status: DC

## 2021-09-23 NOTE — Telephone Encounter (Signed)
Pt left msg on triage, she is a [facility] pt, needs refill on metformin. Should she contact her PCP? Looking in her meds, looks like she has already contacted her PCP and they have sent RF in for metformin. Called pt back, no answer, LVMTRC.

## 2021-09-23 NOTE — Telephone Encounter (Signed)
Linda Chambers calling in she asks if Linda Chambers can refill her Metformin due to her doctor at her obgyn not being there anymore.

## 2021-10-01 ENCOUNTER — Telehealth: Payer: Self-pay | Admitting: Family Medicine

## 2021-10-01 NOTE — Telephone Encounter (Signed)
Linda Chambers wanted to give you an update on her chest pain since taking the medication since her last visit. She states she does not feel better and she feels dizzy sometimes as well as pins and needles in her hands and feet. She scheduled a follow up with you this Friday.

## 2021-10-03 ENCOUNTER — Telehealth: Payer: Self-pay | Admitting: Family Medicine
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
## 2021-10-03 NOTE — Telephone Encounter (Signed)
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  Pt states her heart rate has been elevated a lot recently.  Spoke with Selena Batten and she asked that I send you urgent message, & I advised pt if chest pain gets worse or SOB to go to ED. She is scheduled for an appt with you tomorrow morning,  and you don't have any openings this afternoon.  Is she ok to wait or should I work her in today?

## 2021-10-04 ENCOUNTER — Ambulatory Visit (INDEPENDENT_AMBULATORY_CARE_PROVIDER_SITE_OTHER): Payer: BC Managed Care – PPO | Admitting: Medical

## 2021-10-04 ENCOUNTER — Encounter: Payer: Self-pay | Admitting: Family Medicine

## 2021-10-04 VITALS — BP 128/86 | HR 99 | Temp 98.4°F | Wt 243.6 lb

## 2021-10-04 DIAGNOSIS — R42 Dizziness and giddiness: Secondary | ICD-10-CM | POA: Insufficient documentation

## 2021-10-04 DIAGNOSIS — G43909 Migraine, unspecified, not intractable, without status migrainosus: Secondary | ICD-10-CM

## 2021-10-04 DIAGNOSIS — R002 Palpitations: Secondary | ICD-10-CM | POA: Insufficient documentation

## 2021-10-04 DIAGNOSIS — R0789 Other chest pain: Secondary | ICD-10-CM | POA: Diagnosis not present

## 2021-10-04 DIAGNOSIS — G4733 Obstructive sleep apnea (adult) (pediatric): Secondary | ICD-10-CM

## 2021-10-04 DIAGNOSIS — R7303 Prediabetes: Secondary | ICD-10-CM

## 2021-10-04 DIAGNOSIS — R000 Tachycardia, unspecified: Secondary | ICD-10-CM | POA: Insufficient documentation

## 2021-10-04 DIAGNOSIS — R7301 Impaired fasting glucose: Secondary | ICD-10-CM

## 2021-10-04 DIAGNOSIS — R202 Paresthesia of skin: Secondary | ICD-10-CM | POA: Diagnosis not present

## 2021-10-04 MED ORDER — NEBIVOLOL HCL 5 MG PO TABS: 5.0000 mg | ORAL_TABLET | Freq: Every day | ORAL | 0 refills | Status: DC

## 2021-10-04 NOTE — Assessment & Plan Note (Addendum)
She has had various scans including head scanning and various labs over the last year or 2.  We had referred to neurology for headaches dizziness and paresthesias a few months ago but I am not sure they really addressed the dizziness or paresthesias.  She does have a consult with rheumatology coming up and she will discuss nerve conduction testing when she sees them.  We discussed possible causes of numbness.  Her B12 level has been checked within the past year that was normal, she does run somewhat prediabetic with glucose, other labs have been reviewed so no obvious specific cause just yet regarding the paresthesias

## 2021-10-06 ENCOUNTER — Other Ambulatory Visit: Payer: Self-pay | Admitting: Medical

## 2021-10-09 ENCOUNTER — Ambulatory Visit (INDEPENDENT_AMBULATORY_CARE_PROVIDER_SITE_OTHER): Payer: BC Managed Care – PPO

## 2021-10-09 DIAGNOSIS — R002 Palpitations: Secondary | ICD-10-CM

## 2021-10-09 DIAGNOSIS — R42 Dizziness and giddiness: Secondary | ICD-10-CM

## 2021-10-09 DIAGNOSIS — R0789 Other chest pain: Secondary | ICD-10-CM

## 2021-10-14 ENCOUNTER — Ambulatory Visit (INDEPENDENT_AMBULATORY_CARE_PROVIDER_SITE_OTHER): Payer: BC Managed Care – PPO | Admitting: Family Medicine

## 2021-10-14 ENCOUNTER — Telehealth: Payer: Self-pay | Admitting: Internal Medicine

## 2021-10-14 ENCOUNTER — Encounter: Payer: Self-pay | Admitting: Family Medicine

## 2021-10-14 VITALS — BP 120/76 | HR 104 | Ht 64.0 in | Wt 245.4 lb

## 2021-10-14 DIAGNOSIS — R000 Tachycardia, unspecified: Secondary | ICD-10-CM

## 2021-10-14 DIAGNOSIS — R42 Dizziness and giddiness: Secondary | ICD-10-CM

## 2021-10-14 DIAGNOSIS — R0781 Pleurodynia: Secondary | ICD-10-CM

## 2021-10-14 DIAGNOSIS — R0789 Other chest pain: Secondary | ICD-10-CM

## 2021-10-14 MED ORDER — MELOXICAM 15 MG PO TABS: 15.0000 mg | ORAL_TABLET | Freq: Every day | ORAL | 0 refills | Status: DC

## 2021-10-14 NOTE — Telephone Encounter (Signed)
Pt called and states that she is on day 6 out of 30 days for heart monitor and she is now having sharp pains when she tries taking a breath. She feels like she can not take a good deep breath without having that pain. No other new symptoms. She already had dizziness and numbness and was seen for that. She wants to know what does she need to do?

## 2021-10-14 NOTE — Telephone Encounter (Signed)
Called pt and left message that she would need an appt. Also sent a mychart message to patient

## 2021-10-14 NOTE — Progress Notes (Unsigned)
Chief Complaint  Patient presents with   Chest Pain    Sharp pain in the center of her chest every time she breathes in. Has been wearing a heart monitor x 6 days.     Today she started feeling a sharp pain in the center of the chest when she breathes in--mostly with deep breaths. Denies soreness to the touch (just slightly where the monitor is--getting a rash from the adhesive). The dizziness gets worse when she has the pain. Today the dizziness hasn't resolved when she sits down, like when she saw [place]. Denies vertigo. Head and limbs feel heavy. Starts seeing stars.  Pain isn't worse with laying forward, but dizziness is worse, feels the heart racing more. Pain with breathing is sometimes worse laying down on her back. No problems on her side. Fine with CPAP on.  Fine laying flat with CPAP, not otherwise (ie scrolling on phone). OLD CP not otday's   128/97, P 105, 95% (standing at the time).  This is different than the CP she had when in the ER (that was a pressure, felt fluttering).  PMH: Bipolar, anxiety, migraines, sleep apnea, exercise-induced asthma. 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    busPIRone (BUSPAR) 5 MG tablet Take 5 mg by mouth daily.    cetirizine (ZYRTEC) 10 MG tablet Take 1 tablet by mouth daily.    metFORMIN (GLUCOPHAGE) 500 MG tablet Take 1 tablet (500 mg total) by mouth in the morning and at bedtime.    Multiple Vitamin (MULTIVITAMIN) tablet Take 1 tablet by mouth at bedtime.    rizatriptan (MAXALT-MLT) 10 MG disintegrating tablet Take 1 tablet (10 mg total) by mouth as needed for migraine. May repeat in 2 hours if needed 10/14/2021: Used last week, Th or Fri   topiramate (TOPAMAX) 50 MG tablet Take 2 tablets (100 mg total) by mouth 2 (two) times daily. (Patient taking differently: Take 50 mg by mouth 2 (two) times daily.)     VRAYLAR 1.5 MG capsule Take 1.5 mg by mouth daily.    [DISCONTINUED] nebivolol (BYSTOLIC) 5 MG tablet Take 1 tablet (5 mg total) by mouth daily. For palpitations    albuterol (VENTOLIN HFA) 108 (90 Base) MCG/ACT inhaler TAKE 2 PUFFS BY MOUTH EVERY 6 HOURS AS NEEDED FOR WHEEZE OR SHORTNESS OF BREATH (Patient not taking: Reported on 10/04/2021) 10/04/2021: Prn Last dose was a year ago   ibuprofen (ADVIL) 600 MG tablet Take 1 tablet (600 mg total) by mouth every 8 (eight) hours as needed for moderate pain. (Patient not taking: Reported on 10/14/2021) 10/14/2021: prn   Omeprazole Magnesium (PRILOSEC PO) Take by mouth. (Patient not taking: Reported on 10/14/2021) 10/14/2021: prn   ondansetron (ZOFRAN-ODT) 4 MG disintegrating tablet Take 1 tablet (4 mg total) by mouth every 8 (eight) hours as needed for nausea or vomiting. (Patient not taking: Reported on 10/14/2021) 10/14/2021: Took last week, Th or Fri   [DISCONTINUED] JUNEL FE 1/20 1-20 MG-MCG tablet TAKE 1 TABLET BY MOUTH EVERY DAY (Patient not taking: Reported on 09/11/2021)    [DISCONTINUED] Rimegepant Sulfate (NURTEC) 75 MG TBDP Take 1 tablet by mouth daily as needed. (Patient not taking: Reported on 09/11/2021)    No facility-administered encounter medications on file as of 10/14/2021.   No calfpain  BP 120/76   Pulse (!) 104   Ht 5\' 4"  (1.626 m)   Wt 245  lb 6.4 oz (111.3 kg)   SpO2 98%   BMI 42.12 kg/m    108 pulse   Monitor in mid-sternum--minimal erythema noted at inferior portion, at edge of adhesive Tender below the mointor, but not at CC junctions. Tender at medial-inferior ribs (cartilage Mild epigastric  Appears comfortable Calves without tenderness, swelling, negative [place]  ? Pericarditis Took ibuprofen x 2 weeks, didn't help  May need echo vs cardiology eval as f/u. ?long COVID  Elevated WBC, chronic, evaluated by hematologist   Try warm compresses to the painful areas (I know you can't do that for the upper chest, but you  can for the lower chest. I'm suspecting a musculoskeletal cause, rather than cardiac.  Given that you were slightly tender in the upper stomach, and that the esophagus runs in the mid-chest, I'm also going to suggest treatment for acid reflux.  Take the prilosec/omeprazole once daily. Take meloxicam once daily with food. Take it every day until your pain has completely resolved (for up to 2 weeks, if needed).   Do not take ibuprofen/motrin/advil/naproxen/aleve/BC or goody powder while taking the meloxicam. Also try and avoid Excedrin. You may take Tylenol and any of your prescription medications for migraines are fine.

## 2021-10-14 NOTE — Telephone Encounter (Signed)
If she is having pain, she needs a visit

## 2021-10-14 NOTE — Patient Instructions (Addendum)
Take the prilosec/omeprazole once daily. Take meloxicam once daily with food. Take it every day until your pain has completely resolved (for up to 2 weeks, if needed).   Do not take ibuprofen/motrin/advil/naproxen/aleve/BC or goody powder while taking the meloxicam. Also try and avoid Excedrin. You may take Tylenol and any of your prescription medications for migraines are fine.  Try warm compresses to the painful areas (I know you can't do that for the upper chest, but you can for the lower chest. I'm suspecting a musculoskeletal cause, rather than cardiac.  Given that you were slightly tender in the upper stomach, and that the esophagus runs in the mid-chest, I'm also going to suggest treatment for acid reflux.  Follow up in 1-2 weeks, if symptoms persist or worsen, or don't completely resolve. We will need to see what heart monitor shows, and decide if you need any further evaluation (ie echocardiogram vs cardiology consult vs other)--I will leave that up to [facility].

## 2021-10-17 ENCOUNTER — Ambulatory Visit (INDEPENDENT_AMBULATORY_CARE_PROVIDER_SITE_OTHER): Payer: BC Managed Care – PPO | Admitting: Rheumatology

## 2021-10-17 ENCOUNTER — Emergency Department: Payer: BC Managed Care – PPO

## 2021-10-17 ENCOUNTER — Encounter: Payer: Self-pay | Admitting: Rheumatology

## 2021-10-17 ENCOUNTER — Encounter: Payer: Self-pay | Admitting: *Deleted

## 2021-10-17 ENCOUNTER — Other Ambulatory Visit: Payer: Self-pay

## 2021-10-17 ENCOUNTER — Encounter: Payer: BC Managed Care – PPO | Admitting: Medical

## 2021-10-17 VITALS — BP 120/84 | HR 88 | Ht 65.0 in | Wt 246.8 lb

## 2021-10-17 DIAGNOSIS — M79662 Pain in left lower leg: Secondary | ICD-10-CM | POA: Insufficient documentation

## 2021-10-17 DIAGNOSIS — M25561 Pain in right knee: Secondary | ICD-10-CM

## 2021-10-17 DIAGNOSIS — B999 Unspecified infectious disease: Secondary | ICD-10-CM

## 2021-10-17 DIAGNOSIS — R202 Paresthesia of skin: Secondary | ICD-10-CM

## 2021-10-17 DIAGNOSIS — E7800 Pure hypercholesterolemia, unspecified: Secondary | ICD-10-CM

## 2021-10-17 DIAGNOSIS — Z8659 Personal history of other mental and behavioral disorders: Secondary | ICD-10-CM

## 2021-10-17 DIAGNOSIS — R7303 Prediabetes: Secondary | ICD-10-CM

## 2021-10-17 DIAGNOSIS — R0602 Shortness of breath: Secondary | ICD-10-CM | POA: Insufficient documentation

## 2021-10-17 DIAGNOSIS — Z8742 Personal history of other diseases of the female genital tract: Secondary | ICD-10-CM

## 2021-10-17 DIAGNOSIS — M79605 Pain in left leg: Secondary | ICD-10-CM | POA: Insufficient documentation

## 2021-10-17 DIAGNOSIS — J45909 Unspecified asthma, uncomplicated: Secondary | ICD-10-CM | POA: Insufficient documentation

## 2021-10-17 DIAGNOSIS — E221 Hyperprolactinemia: Secondary | ICD-10-CM

## 2021-10-17 DIAGNOSIS — M545 Low back pain, unspecified: Secondary | ICD-10-CM

## 2021-10-17 DIAGNOSIS — R21 Rash and other nonspecific skin eruption: Secondary | ICD-10-CM

## 2021-10-17 DIAGNOSIS — J4520 Mild intermittent asthma, uncomplicated: Secondary | ICD-10-CM

## 2021-10-17 DIAGNOSIS — F3181 Bipolar II disorder: Secondary | ICD-10-CM

## 2021-10-17 DIAGNOSIS — Q8789 Other specified congenital malformation syndromes, not elsewhere classified: Secondary | ICD-10-CM

## 2021-10-17 DIAGNOSIS — G43701 Chronic migraine without aura, not intractable, with status migrainosus: Secondary | ICD-10-CM

## 2021-10-17 DIAGNOSIS — R899 Unspecified abnormal finding in specimens from other organs, systems and tissues: Secondary | ICD-10-CM

## 2021-10-17 DIAGNOSIS — Z7951 Long term (current) use of inhaled steroids: Secondary | ICD-10-CM | POA: Insufficient documentation

## 2021-10-17 DIAGNOSIS — M25562 Pain in left knee: Secondary | ICD-10-CM

## 2021-10-17 DIAGNOSIS — Z8719 Personal history of other diseases of the digestive system: Secondary | ICD-10-CM

## 2021-10-17 DIAGNOSIS — R079 Chest pain, unspecified: Secondary | ICD-10-CM | POA: Insufficient documentation

## 2021-10-17 DIAGNOSIS — Q528 Other specified congenital malformations of female genitalia: Secondary | ICD-10-CM

## 2021-10-17 DIAGNOSIS — J323 Chronic sphenoidal sinusitis: Secondary | ICD-10-CM

## 2021-10-17 DIAGNOSIS — M791 Myalgia, unspecified site: Secondary | ICD-10-CM | POA: Diagnosis not present

## 2021-10-17 DIAGNOSIS — D75839 Thrombocytosis, unspecified: Secondary | ICD-10-CM

## 2021-10-17 DIAGNOSIS — G8929 Other chronic pain: Secondary | ICD-10-CM

## 2021-10-17 DIAGNOSIS — G4733 Obstructive sleep apnea (adult) (pediatric): Secondary | ICD-10-CM

## 2021-10-17 LAB — BASIC METABOLIC PANEL
Anion gap: 6 (ref 5–15)
BUN: 13 mg/dL (ref 6–20)
CO2: 24 mmol/L (ref 22–32)
Calcium: 9.3 mg/dL (ref 8.9–10.3)
Chloride: 107 mmol/L (ref 98–111)
Creatinine, Ser: 0.77 mg/dL (ref 0.44–1.00)
GFR, Estimated: >60 mL/min (ref >60)
Glucose, Bld: 100 mg/dL — ABNORMAL HIGH (ref 70–99)
Potassium: 3.5 mmol/L (ref 3.5–5.1)
Sodium: 137 mmol/L (ref 135–145)

## 2021-10-17 LAB — TROPONIN I (HIGH SENSITIVITY): Troponin I (High Sensitivity): 3 ng/L (ref <18)

## 2021-10-17 LAB — CBC
HCT: 40 % (ref 36.0–46.0)
Hemoglobin: 12.8 g/dL (ref 12.0–15.0)
MCH: 24.2 pg — ABNORMAL LOW (ref 26.0–34.0)
MCHC: 32 g/dL (ref 30.0–36.0)
MCV: 75.6 fL — ABNORMAL LOW (ref 80.0–100.0)
Platelets: 434 10*3/uL — ABNORMAL HIGH (ref 150–400)
RBC: 5.29 MIL/uL — ABNORMAL HIGH (ref 3.87–5.11)
RDW: 15.2 % (ref 11.5–15.5)
WBC: 11.3 10*3/uL — ABNORMAL HIGH (ref 4.0–10.5)
nRBC: 0 % (ref 0.0–0.2)

## 2021-10-17 LAB — D-DIMER, QUANTITATIVE: D-Dimer, Quant: 0.29 ug/mL-FEU (ref 0.00–0.50)

## 2021-10-17 NOTE — ED Notes (Signed)
To u/s via w/c accomp by u/s tech

## 2021-10-17 NOTE — ED Provider Triage Note (Signed)
Emergency Medicine Provider Triage Evaluation Note  Linda Chambers, a 26 y.o. female  was evaluated in triage.  Pt complains of left calf pain, without known injury.  Patient also endorses 4 days of chest pain with associated shortness of breath.  Patient is currently under care of a cardiologist, and has been wearing a Holter monitor for the last 8 days for evaluation of tachycardia.  She notes intermittent dizziness but denies any syncope, cough, congestion.  Review of Systems  Positive: Left calf pain, SOB Negative: FCS  Physical Exam  BP (!) 132/100 (BP Location: Left Arm)   Pulse (!) 101   Temp 98.7 F (37.1 C) (Oral)   Resp 18   Ht 5\' 4"  (1.626 m)   Wt 111.1 kg   SpO2 94%   BMI 42.05 kg/m  Gen:   Awake, no distress  NAD Resp:  Normal effort CTA MSK:   Moves extremities without difficulty  Other:    Medical Decision Making  Medically screening exam initiated at 8:21 PM.  Appropriate orders placed.  Linda Chambers was informed that the remainder of the evaluation will be completed by another provider, this initial triage assessment does not replace that evaluation, and the importance of remaining in the ED until their evaluation is complete.  Patient to the ED for evaluation of left calf pain without preceding injury, trauma, or fall.   Erling Conte, PA-C 10/17/21 2025

## 2021-10-17 NOTE — ED Triage Notes (Signed)
Pt has left leg pain in the calf.  No known injury.  Pt also reports chest pain for 4 days.  Pt has sob.  Pt is wearing a heart monitor for 8 days.  Pt reports dizziness.  Pt alert  speech clear.

## 2021-10-17 NOTE — Addendum Note (Signed)
Addended by: Ellen Henri on: 10/17/2021 09:42 AM   Modules accepted: Orders

## 2021-10-17 NOTE — Patient Instructions (Signed)
Back Exercises The following exercises strengthen the muscles that help to support the trunk (torso) and back. They also help to keep the lower back flexible. Doing these exercises can help to prevent or lessen existing low back pain. If you have back pain or discomfort, try doing these exercises 2-3 times each day or as told by your health care provider. As your pain improves, do them once each day, but increase the number of times that you repeat the steps for each exercise (do more repetitions). To prevent the recurrence of back pain, continue to do these exercises once each day or as told by your health care provider. Do exercises exactly as told by your health care provider and adjust them as directed. It is normal to feel mild stretching, pulling, tightness, or discomfort as you do these exercises, but you should stop right away if you feel sudden pain or your pain gets worse. Exercises Single knee to chest Repeat these steps 3-5 times for each leg: Lie on your back on a firm bed or the floor with your legs extended. Bring one knee to your chest. Your other leg should stay extended and in contact with the floor. Hold your knee in place by grabbing your knee or thigh with both hands and hold. Pull on your knee until you feel a gentle stretch in your lower back or buttocks. Hold the stretch for 10-30 seconds. Slowly release and straighten your leg.  Pelvic tilt Repeat these steps 5-10 times: Lie on your back on a firm bed or the floor with your legs extended. Bend your knees so they are pointing toward the ceiling and your feet are flat on the floor. Tighten your lower abdominal muscles to press your lower back against the floor. This motion will tilt your pelvis so your tailbone points up toward the ceiling instead of pointing to your feet or the floor. With gentle tension and even breathing, hold this position for 5-10 seconds.  Cat-cow Repeat these steps until your lower back becomes  more flexible: Get into a hands-and-knees position on a firm bed or the floor. Keep your hands under your shoulders, and keep your knees under your hips. You may place padding under your knees for comfort. Let your head hang down toward your chest. Contract your abdominal muscles and point your tailbone toward the floor so your lower back becomes rounded like the back of a cat. Hold this position for 5 seconds. Slowly lift your head, let your abdominal muscles relax, and point your tailbone up toward the ceiling so your back forms a sagging arch like the back of a cow. Hold this position for 5 seconds.  Press-ups Repeat these steps 5-10 times: Lie on your abdomen (face-down) on a firm bed or the floor. Place your palms near your head, about shoulder-width apart. Keeping your back as relaxed as possible and keeping your hips on the floor, slowly straighten your arms to raise the top half of your body and lift your shoulders. Do not use your back muscles to raise your upper torso. You may adjust the placement of your hands to make yourself more comfortable. Hold this position for 5 seconds while you keep your back relaxed. Slowly return to lying flat on the floor.  Bridges Repeat these steps 10 times: Lie on your back on a firm bed or the floor. Bend your knees so they are pointing toward the ceiling and your feet are flat on the floor. Your arms should be flat   at your sides, next to your body. Tighten your buttocks muscles and lift your buttocks off the floor until your waist is at almost the same height as your knees. You should feel the muscles working in your buttocks and the back of your thighs. If you do not feel these muscles, slide your feet 1-2 inches (2.5-5 cm) farther away from your buttocks. Hold this position for 3-5 seconds. Slowly lower your hips to the starting position, and allow your buttocks muscles to relax completely. If this exercise is too easy, try doing it with your arms  crossed over your chest. Abdominal crunches Repeat these steps 5-10 times: Lie on your back on a firm bed or the floor with your legs extended. Bend your knees so they are pointing toward the ceiling and your feet are flat on the floor. Cross your arms over your chest. Tip your chin slightly toward your chest without bending your neck. Tighten your abdominal muscles and slowly raise your torso high enough to lift your shoulder blades a tiny bit off the floor. Avoid raising your torso higher than that because it can put too much stress on your lower back and does not help to strengthen your abdominal muscles. Slowly return to your starting position.  Back lifts Repeat these steps 5-10 times: Lie on your abdomen (face-down) with your arms at your sides, and rest your forehead on the floor. Tighten the muscles in your legs and your buttocks. Slowly lift your chest off the floor while you keep your hips pressed to the floor. Keep the back of your head in line with the curve in your back. Your eyes should be looking at the floor. Hold this position for 3-5 seconds. Slowly return to your starting position.  Contact a health care provider if: Your back pain or discomfort gets much worse when you do an exercise. Your worsening back pain or discomfort does not lessen within 2 hours after you exercise. If you have any of these problems, stop doing these exercises right away. Do not do them again unless your health care provider says that you can. Get help right away if: You develop sudden, severe back pain. If this happens, stop doing the exercises right away. Do not do them again unless your health care provider says that you can. This information is not intended to replace advice given to you by your health care provider. Make sure you discuss any questions you have with your health care provider. Document Revised: 09/25/2020 Document Reviewed: 06/13/2020 [employer] Patient Education  2023 [employer] Knee Exercises Ask your health care provider which exercises are safe for you. Do exercises exactly as told by your health care provider and adjust them as directed. It is normal to feel mild stretching, pulling, tightness, or discomfort as you do these exercises. Stop right away if you feel sudden pain or your pain gets worse. Do not begin these exercises until told by your health care provider. Stretching and range-of-motion exercises These exercises warm up your muscles and joints and improve the movement and flexibility of your knee. These exercises also help to relieve pain and swelling. Knee extension, prone  Lie on your abdomen (prone position) on a bed. Place your left / right knee just beyond the edge of the surface so your knee is not on the bed. You can put a towel under your left / right thigh just above your kneecap for comfort. Relax your leg muscles and allow gravity to straighten your knee (  extension). You should feel a stretch behind your left / right knee. Hold this position for __________ seconds. Scoot up so your knee is supported between repetitions. Repeat __________ times. Complete this exercise __________ times a day. Knee flexion, active  Lie on your back with both legs straight. If this causes back discomfort, bend your left / right knee so your foot is flat on the floor. Slowly slide your left / right heel back toward your buttocks. Stop when you feel a gentle stretch in the front of your knee or thigh (flexion). Hold this position for __________ seconds. Slowly slide your left / right heel back to the starting position. Repeat __________ times. Complete this exercise __________ times a day. Quadriceps stretch, prone  Lie on your abdomen on a firm surface, such as a bed or padded floor. Bend your left / right knee and hold your ankle. If you cannot reach your ankle or pant leg, loop a belt around your foot and grab the belt instead. Gently pull your heel toward  your buttocks. Your knee should not slide out to the side. You should feel a stretch in the front of your thigh and knee (quadriceps). Hold this position for __________ seconds. Repeat __________ times. Complete this exercise __________ times a day. Hamstring, supine  Lie on your back (supine position). Loop a belt or towel over the ball of your left / right foot. The ball of your foot is on the walking surface, right under your toes. Straighten your left / right knee and slowly pull on the belt to raise your leg until you feel a gentle stretch behind your knee (hamstring). Do not let your knee bend while you do this. Keep your other leg flat on the floor. Hold this position for __________ seconds. Repeat __________ times. Complete this exercise __________ times a day. Strengthening exercises These exercises build strength and endurance in your knee. Endurance is the ability to use your muscles for a long time, even after they get tired. Quadriceps, isometric This exercise strengthens the muscles in front of your thigh (quadriceps) without moving your knee joint (isometric). Lie on your back with your left / right leg extended and your other knee bent. Put a rolled towel or small pillow under your knee if told by your health care provider. Slowly tense the muscles in the front of your left / right thigh. You should see your kneecap slide up toward your hip or see increased dimpling just above the knee. This motion will push the back of the knee toward the floor. For __________ seconds, hold the muscle as tight as you can without increasing your pain. Relax the muscles slowly and completely. Repeat __________ times. Complete this exercise __________ times a day. Straight leg raises This exercise strengthens the muscles in front of your thigh (quadriceps) and the muscles that move your hips (hip flexors). Lie on your back with your left / right leg extended and your other knee bent. Tense the  muscles in the front of your left / right thigh. You should see your kneecap slide up or see increased dimpling just above the knee. Your thigh may even shake a bit. Keep these muscles tight as you raise your leg 4-6 inches (10-15 cm) off the floor. Do not let your knee bend. Hold this position for __________ seconds. Keep these muscles tense as you lower your leg. Relax your muscles slowly and completely after each repetition. Repeat __________ times. Complete this exercise __________ times a day.   Hamstring, isometric  Lie on your back on a firm surface. Bend your left / right knee about __________ degrees. Dig your left / right heel into the surface as if you are trying to pull it toward your buttocks. Tighten the muscles in the back of your thighs (hamstring) to "dig" as hard as you can without increasing any pain. Hold this position for __________ seconds. Release the tension gradually and allow your muscles to relax completely for __________ seconds after each repetition. Repeat __________ times. Complete this exercise __________ times a day. Hamstring curls If told by your health care provider, do this exercise while wearing ankle weights. Begin with __________lb / kg weights. Then increase the weight by 1 lb (0.5 kg) increments. Do not wear ankle weights that are more than __________lb / kg. Lie on your abdomen with your legs straight. Bend your left / right knee as far as you can without feeling pain. Keep your hips flat against the floor. Hold this position for __________ seconds. Slowly lower your leg to the starting position. Repeat __________ times. Complete this exercise __________ times a day. Squats This exercise strengthens the muscles in front of your thigh and knee (quadriceps). Stand in front of a table, with your feet and knees pointing straight ahead. You may rest your hands on the table for balance but not for support. Slowly bend your knees and lower your hips like you  are going to sit in a chair. Keep your weight over your heels, not over your toes. Keep your lower legs upright so they are parallel with the table legs. Do not let your hips go lower than your knees. Do not bend lower than told by your health care provider. If your knee pain increases, do not bend as low. Hold the squat position for __________ seconds. Slowly push with your legs to return to standing. Do not use your hands to pull yourself to standing. Repeat __________ times. Complete this exercise __________ times a day. Wall slides This exercise strengthens the muscles in front of your thigh and knee (quadriceps). Lean your back against a smooth wall or door, and walk your feet out 18-24 inches (46-61 cm) from it. Place your feet hip-width apart. Slowly slide down the wall or door until your knees bend __________ degrees. Keep your knees over your heels, not over your toes. Keep your knees in line with your hips. Hold this position for __________ seconds. Repeat __________ times. Complete this exercise __________ times a day. Straight leg raises, side-lying This exercise strengthens the muscles that rotate the leg at the hip and move it away from your body (hip abductors). Lie on your side with your left / right leg in the top position. Lie so your head, shoulder, knee, and hip line up. You may bend your bottom knee to help you keep your balance. Roll your hips slightly forward so your hips are stacked directly over each other and your left / right knee is facing forward. Leading with your heel, lift your top leg 4-6 inches (10-15 cm). You should feel the muscles in your outer hip lifting. Do not let your foot drift forward. Do not let your knee roll toward the ceiling. Hold this position for __________ seconds. Slowly return your leg to the starting position. Let your muscles relax completely after each repetition. Repeat __________ times. Complete this exercise __________ times a  day. Straight leg raises, prone This exercise stretches the muscles that move your hips away from the front   of the pelvis (hip extensors). Lie on your abdomen on a firm surface. You can put a pillow under your hips if that is more comfortable. Tense the muscles in your buttocks and lift your left / right leg about 4-6 inches (10-15 cm). Keep your knee straight as you lift your leg. Hold this position for __________ seconds. Slowly lower your leg to the starting position. Let your leg relax completely after each repetition. Repeat __________ times. Complete this exercise __________ times a day. This information is not intended to replace advice given to you by your health care provider. Make sure you discuss any questions you have with your health care provider. Document Revised: 12/11/2020 Document Reviewed: 12/11/2020 [employer] Patient Education  2023 [employer]  

## 2021-10-18 ENCOUNTER — Emergency Department
Admission: EM | Admit: 2021-10-18 | Discharge: 2021-10-18 | Disposition: A | Discharge status: Home / Self Care | Payer: BC Managed Care – PPO | Attending: Emergency Medicine | Admitting: Emergency Medicine

## 2021-10-18 ENCOUNTER — Encounter: Payer: Self-pay | Admitting: Internal Medicine

## 2021-10-18 DIAGNOSIS — M79605 Pain in left leg: Secondary | ICD-10-CM

## 2021-10-18 DIAGNOSIS — R079 Chest pain, unspecified: Secondary | ICD-10-CM

## 2021-10-18 LAB — TROPONIN I (HIGH SENSITIVITY): Troponin I (High Sensitivity): <2 ng/L (ref <18)

## 2021-10-18 LAB — CK: Total CK: 49 U/L (ref 29–143)

## 2021-10-18 NOTE — Discharge Instructions (Signed)
Your cardiac work-up today was reassuring including your chest x-ray and ultrasound of your left leg.  Your D-dimer was negative.  You have no sign of blood clot in your lungs or legs today.
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
## 2021-10-26 ENCOUNTER — Other Ambulatory Visit: Payer: Self-pay | Admitting: Medical

## 2021-10-28 NOTE — Telephone Encounter (Signed)
Looks like this was discontinued at recent visit with Dr. Lynelle Doctor on 7/6

## 2021-10-31 ENCOUNTER — Telehealth: Payer: Self-pay | Admitting: Medical

## 2021-10-31 NOTE — Telephone Encounter (Signed)
Pharmacy sent a message about concerns about asthma not being under control.  They noted that she had multiple albuterol rescue inhaler prescriptions in recent months but no controller medication.  If she is using albuterol at least 3 days/week, or having some frequent shortness of breath or wheezing then she should be on a preventative controller medication as well  If that is the case I will be glad to send out a prevention inhaler such as Advair, Symbicort, Breo or other similar inhalers to use daily for reducing flareups and symptoms

## 2021-11-08 ENCOUNTER — Encounter: Payer: Self-pay | Admitting: Medical

## 2021-11-08 ENCOUNTER — Ambulatory Visit: Payer: BC Managed Care – PPO | Admitting: Family Medicine

## 2021-11-08 ENCOUNTER — Ambulatory Visit (INDEPENDENT_AMBULATORY_CARE_PROVIDER_SITE_OTHER): Payer: BC Managed Care – PPO | Admitting: Medical

## 2021-11-08 VITALS — BP 120/84 | HR 96 | Temp 98.3°F | Ht 64.25 in | Wt 243.8 lb

## 2021-11-08 DIAGNOSIS — Q528 Other specified congenital malformations of female genitalia: Secondary | ICD-10-CM

## 2021-11-08 DIAGNOSIS — Z0000 Encounter for general adult medical examination without abnormal findings: Secondary | ICD-10-CM | POA: Diagnosis not present

## 2021-11-08 DIAGNOSIS — R002 Palpitations: Secondary | ICD-10-CM

## 2021-11-08 DIAGNOSIS — Q8789 Other specified congenital malformation syndromes, not elsewhere classified: Secondary | ICD-10-CM

## 2021-11-08 DIAGNOSIS — R7303 Prediabetes: Secondary | ICD-10-CM

## 2021-11-08 DIAGNOSIS — D75839 Thrombocytosis, unspecified: Secondary | ICD-10-CM

## 2021-11-08 DIAGNOSIS — J45990 Exercise induced bronchospasm: Secondary | ICD-10-CM

## 2021-11-08 DIAGNOSIS — Z62819 Personal history of unspecified abuse in childhood: Secondary | ICD-10-CM

## 2021-11-08 DIAGNOSIS — Z113 Encounter for screening for infections with a predominantly sexual mode of transmission: Secondary | ICD-10-CM

## 2021-11-08 DIAGNOSIS — Z131 Encounter for screening for diabetes mellitus: Secondary | ICD-10-CM | POA: Diagnosis not present

## 2021-11-08 DIAGNOSIS — R7301 Impaired fasting glucose: Secondary | ICD-10-CM

## 2021-11-08 DIAGNOSIS — G4733 Obstructive sleep apnea (adult) (pediatric): Secondary | ICD-10-CM

## 2021-11-08 DIAGNOSIS — Z6841 Body Mass Index (BMI) 40.0 and over, adult: Secondary | ICD-10-CM

## 2021-11-08 DIAGNOSIS — G43909 Migraine, unspecified, not intractable, without status migrainosus: Secondary | ICD-10-CM

## 2021-11-08 DIAGNOSIS — F419 Anxiety disorder, unspecified: Secondary | ICD-10-CM

## 2021-11-08 DIAGNOSIS — R42 Dizziness and giddiness: Secondary | ICD-10-CM

## 2021-11-08 DIAGNOSIS — Z13220 Encounter for screening for lipoid disorders: Secondary | ICD-10-CM

## 2021-11-08 DIAGNOSIS — R202 Paresthesia of skin: Secondary | ICD-10-CM

## 2021-11-08 DIAGNOSIS — G8929 Other chronic pain: Secondary | ICD-10-CM

## 2021-11-08 DIAGNOSIS — E221 Hyperprolactinemia: Secondary | ICD-10-CM

## 2021-11-08 DIAGNOSIS — F3181 Bipolar II disorder: Secondary | ICD-10-CM

## 2021-11-08 DIAGNOSIS — Z8742 Personal history of other diseases of the female genital tract: Secondary | ICD-10-CM

## 2021-11-08 DIAGNOSIS — J309 Allergic rhinitis, unspecified: Secondary | ICD-10-CM

## 2021-11-08 DIAGNOSIS — R109 Unspecified abdominal pain: Secondary | ICD-10-CM

## 2021-11-08 DIAGNOSIS — L680 Hirsutism: Secondary | ICD-10-CM | POA: Insufficient documentation

## 2021-11-08 DIAGNOSIS — Z79899 Other long term (current) drug therapy: Secondary | ICD-10-CM

## 2021-11-08 DIAGNOSIS — R799 Abnormal finding of blood chemistry, unspecified: Secondary | ICD-10-CM

## 2021-11-08 DIAGNOSIS — R21 Rash and other nonspecific skin eruption: Secondary | ICD-10-CM | POA: Insufficient documentation

## 2021-11-08 NOTE — Assessment & Plan Note (Signed)
Continue with counseling weekly

## 2021-11-08 NOTE — Assessment & Plan Note (Signed)
No current symptoms of concern

## 2021-11-08 NOTE — Assessment & Plan Note (Signed)
Updated labs today ?

## 2021-11-08 NOTE — Assessment & Plan Note (Signed)
Dizziness/lightheaded.  I suspect some anxiety component.  Possibly some deconditioning as well.  I have reviewed several blood test that we have done in recent months.  We are awaiting her cardiac event monitor results that should be back within the next week

## 2021-11-08 NOTE — Assessment & Plan Note (Signed)
Routine screening today 

## 2021-11-08 NOTE — Assessment & Plan Note (Signed)
She continues with counseling regularly

## 2021-11-08 NOTE — Assessment & Plan Note (Signed)
Possibly eczema or other.  We discussed her recent dermatology visit to [place].  She used a cream that did not help much.  She does have dermatology follow-up with [place] later this year.  She was concerned about psoriasis but she really does not have any other symptoms or findings suggestive of psoriasis and she has had rheumatoid screening labs in the past year that were all negative other than elevated CRP.  She had negative sed rate, negative CCP, and other markers that were normal

## 2021-11-08 NOTE — Assessment & Plan Note (Signed)
She has been prediabetic in the past.  Updated labs today.  Continue efforts at healthy diet and exercise and weight loss

## 2021-11-08 NOTE — Assessment & Plan Note (Signed)
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

## 2021-11-08 NOTE — Assessment & Plan Note (Signed)
Compliant with CPAP 

## 2021-11-08 NOTE — Assessment & Plan Note (Signed)
Additional labs today to assess

## 2021-11-08 NOTE — Assessment & Plan Note (Signed)
She just turned in a 30-day event monitor this past week.  Await results

## 2021-11-08 NOTE — Assessment & Plan Note (Signed)
She recently saw rheumatology regarding paresthesias and pains.  She also sees neurology in follow-up next week and will discuss the paresthesias further.  1 question would be side effect of medicines such as Topamax.  Her labs have not really suggested any obvious cause of numbness or tingling

## 2021-11-08 NOTE — Assessment & Plan Note (Signed)
Stable per recent labs, reviewed hematology notes from February 2023

## 2021-11-08 NOTE — Assessment & Plan Note (Signed)
Continue efforts to lose weight through healthy diet and exercise.  She did not do well on trial of Ozempic in the past.  She is on Topamax currently but not experiencing any weight loss on this medication

## 2021-11-08 NOTE — Assessment & Plan Note (Signed)
I reviewed her endocrinology notes from March 2023.  We will request lab results as we did not get these but she notes her conversation with Dr. Talmage Nap that they did not feel anything was worrisome at this time.  She is due for prolactin recheck today.

## 2021-11-08 NOTE — Assessment & Plan Note (Signed)
Multifactorial.  I reviewed her CT scan abdomen pelvis from January 2023 showing hepatic steatosis, 3.7 cm ovarian cyst on the right and high riding ovary.  

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
## 2021-11-08 NOTE — Assessment & Plan Note (Signed)
We recently called the pharmacy on her behalf to cancel automated refills that were somewhat harassing to her.  She is only using albuterol maybe once per month.  No recent concerns

## 2021-11-08 NOTE — Assessment & Plan Note (Signed)
Managed by psychiatry, compliant with medications

## 2021-11-08 NOTE — Assessment & Plan Note (Signed)
I reviewed her evaluation from February 2023 with hematology.  They felt like her findings were benign and after several other labs and evaluation they did not feel like she needed other evaluation at this time unless her CBC drastically changed over the coming months.  She has had serial CBCs over the past several months that have been stable with white cells and platelets slightly elevated but stable

## 2021-11-08 NOTE — Assessment & Plan Note (Signed)
Follow up with gynecology

## 2021-11-08 NOTE — Assessment & Plan Note (Addendum)
Migraines much improved on Topamax.  She sees neurology next week.  She will talk to them about hair loss, numbness.  Unfortunately she has not lost much weight while on this medication

## 2021-11-08 NOTE — Assessment & Plan Note (Signed)
History of abnormal lipids.  Fasting lipid profile today.  She does have a history of hepatic steatosis on CT abdomen pelvis in 2023.  Work on efforts to lose more weight through healthy diet and exercise

## 2021-11-11 LAB — HEPATIC FUNCTION PANEL
ALT: 55 IU/L — ABNORMAL HIGH (ref 0–32)
AST: 32 IU/L (ref 0–40)
Albumin: 4.5 g/dL (ref 4.0–5.0)
Alkaline Phosphatase: 86 IU/L (ref 44–121)
Bilirubin Total: 0.2 mg/dL (ref 0.0–1.2)
Bilirubin, Direct: <0.1 mg/dL (ref 0.00–0.40)
Total Protein: 7 g/dL (ref 6.0–8.5)

## 2021-11-11 LAB — LIPID PANEL
Chol/HDL Ratio: 4 ratio (ref 0.0–4.4)
Cholesterol, Total: 170 mg/dL (ref 100–199)
HDL: 42 mg/dL (ref >39)
LDL Chol Calc (NIH): 96 mg/dL (ref 0–99)
Triglycerides: 187 mg/dL — ABNORMAL HIGH (ref 0–149)
VLDL Cholesterol Cal: 32 mg/dL (ref 5–40)

## 2021-11-11 LAB — TSH: TSH: 1.97 u[IU]/mL (ref 0.450–4.500)

## 2021-11-11 LAB — RPR+HIV+GC+CT PANEL
Chlamydia trachomatis, NAA: NEGATIVE
HIV Screen 4th Generation wRfx: NONREACTIVE
Neisseria Gonorrhoeae by PCR: NEGATIVE
RPR Ser Ql: NONREACTIVE

## 2021-11-11 LAB — PROLACTIN: Prolactin: 17.3 ng/mL (ref 4.8–23.3)

## 2021-11-11 LAB — FSH/LH
FSH: 2.9 m[IU]/mL
LH: 14.1 m[IU]/mL

## 2021-11-11 LAB — DHEA-SULFATE: DHEA-SO4: 279 ug/dL (ref 110.0–431.7)

## 2021-11-11 LAB — HEMOGLOBIN A1C
Est. average glucose Bld gHb Est-mCnc: 114 mg/dL
Hgb A1c MFr Bld: 5.6 % (ref 4.8–5.6)

## 2021-11-13 ENCOUNTER — Encounter: Payer: Self-pay | Admitting: Adult Health

## 2021-11-13 ENCOUNTER — Ambulatory Visit (INDEPENDENT_AMBULATORY_CARE_PROVIDER_SITE_OTHER): Payer: BC Managed Care – PPO | Admitting: Adult Health

## 2021-11-13 VITALS — BP 108/78 | HR 87 | Ht 64.0 in | Wt 244.0 lb

## 2021-11-13 DIAGNOSIS — G43709 Chronic migraine without aura, not intractable, without status migrainosus: Secondary | ICD-10-CM

## 2021-11-13 MED ORDER — ZONISAMIDE 25 MG PO CAPS: 25.0000 mg | ORAL_CAPSULE | Freq: Two times a day (BID) | ORAL | 5 refills | Status: DC

## 2021-11-13 NOTE — Patient Instructions (Signed)
Your Plan:  Stop Topamax Start Zonegran 25 mg twice a day  Thank you for coming to see [place] at [facility]. I hope we have been able to provide you high quality care today.  You may receive a patient satisfaction survey over the next few weeks. We would appreciate your feedback and comments so that we may continue to improve ourselves and the health of our patients.
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
## 2021-11-18 ENCOUNTER — Telehealth: Payer: Self-pay | Admitting: Family Medicine

## 2021-11-18 NOTE — Telephone Encounter (Signed)
Linda Chambers called in about her heart monitor results, can you see if it is completed and ready to give results since Linda Chambers is not in this week.

## 2021-11-20 ENCOUNTER — Encounter: Payer: Self-pay | Admitting: Adult Health

## 2021-11-21 MED ORDER — EMGALITY 120 MG/ML ~~LOC~~ SOAJ: 120.0000 mg | SUBCUTANEOUS | 11 refills | Status: AC

## 2021-11-21 NOTE — Addendum Note (Signed)
Addended by: Enedina Finner on: 11/21/2021 01:48 PM   Modules accepted: Orders

## 2021-11-22 ENCOUNTER — Ambulatory Visit (INDEPENDENT_AMBULATORY_CARE_PROVIDER_SITE_OTHER): Payer: BC Managed Care – PPO | Admitting: Family Medicine

## 2021-11-22 VITALS — BP 98/66 | HR 96 | Temp 97.3°F | Wt 241.6 lb

## 2021-11-22 DIAGNOSIS — A084 Viral intestinal infection, unspecified: Secondary | ICD-10-CM | POA: Diagnosis not present

## 2021-11-22 NOTE — Progress Notes (Signed)
   Subjective:    Patient ID: Linda Chambers, female    DOB: March 20, 1996, 26 y.o.   MRN: [id number]  HPI She has a 1 day history of right mid quadrant pain that is constant in nature and made worse with BM also associated with diarrhea and nausea but no fever, chills, blood or pus in her stool.  No other people around her have been sick.   Review of Systems     Objective:   Physical Exam  Alert and in no distress.  Abdominal exam shows      Assessment & Plan:  Viral gastroenteritis Information given in AVS concerning care and to use up to 8 Imodium per day to help with the diarrhea.

## 2021-11-22 NOTE — Patient Instructions (Signed)
You can take up to 8 Imodium per day to help with your diarrhea  viral Gastroenteritis, Adult  Viral gastroenteritis is also known as the stomach flu. This condition may affect your stomach, your small intestine, and your large intestine. It can cause sudden watery poop (diarrhea), fever, and vomiting. This condition is caused by certain germs (viruses). These germs can be passed from person to person very easily (are contagious). Having watery poop and vomiting can make you feel weak and cause you to not have enough water in your body (get dehydrated). This can make you tired and thirsty, make you have a dry mouth, and make it so you pee (urinate) less often. It is important to replace the fluids that you lose from having watery poop and vomiting. What are the causes? You can get sick by catching germs from other people. You can also get sick by: Eating food, drinking water, or touching a surface that has the germs on it (is contaminated). Sharing utensils or other personal items with a person who is sick. What increases the risk? Having a weak body defense system (immune system). Living with one or more children who are younger than 2 years. Living in a nursing home. Going on cruise ships. What are the signs or symptoms? Symptoms of this condition start suddenly. Symptoms may last for a few days or for as long as a week. Common symptoms include: Watery poop. Vomiting. Other symptoms include: Fever. Headache. Feeling tired (fatigue). Pain in the belly (abdomen). Chills. Feeling weak. Feeling like you may vomit (nauseous). Muscle aches. Not feeling hungry. How is this treated? This condition typically goes away on its own. The focus of treatment is to replace the fluids that you lose. This condition may be treated with: An ORS (oral rehydration solution). This is a drink that helps you replace fluids and minerals your body lost. It is sold at pharmacies and stores. Medicines to help  with your symptoms. Probiotic supplements to reduce symptoms of watery poop. Fluids given through an IV tube, if needed. Older adults and people with other diseases or a weak body defense system are at higher risk for not having enough water in the body. Follow these instructions at home: Eating and drinking  Take an ORS as told by your doctor. Drink clear fluids in small amounts as you are able. Clear fluids include: Water. Ice chips. Fruit juice that has water added to it (is diluted). Low-calorie sports drinks. Drink enough fluid to keep your pee (urine) pale yellow. Eat small amounts of healthy foods every 3-4 hours as you are able. This may include whole grains, fruits, vegetables, lean meats, and yogurt. Avoid fluids that have a lot of sugar or caffeine in them. This includes energy drinks, sports drinks, and soda. Avoid spicy or fatty foods. Avoid alcohol. General instructions  Wash your hands often. This is very important after you have watery poop or you vomit. If you cannot use soap and water, use hand sanitizer. Make sure that all people in your home wash their hands well and often. Take over-the-counter and prescription medicines only as told by your doctor. Rest at home while you get better. Watch your condition for any changes. Take a warm bath to help with any burning or pain from having watery poop. Keep all follow-up visits. Contact a doctor if: You cannot keep fluids down. Your symptoms get worse. You have new symptoms. You feel light-headed or dizzy. You have muscle cramps. Get help right away  if: You have chest pain. You have trouble breathing, or you are breathing very fast. You have a fast heartbeat. You feel very weak or you faint. You have a very bad headache, a stiff neck, or both. You have a rash. You have very bad pain, cramping, or bloating in your belly. Your skin feels cold and clammy. You feel mixed up (confused). You have pain when you  pee. You have signs of not having enough water in the body, such as: Dark pee, hardly any pee, or no pee. Cracked lips. Dry mouth. Sunken eyes. Feeling very sleepy. Feeling weak. You have signs of bleeding, such as: You see blood in your vomit. Your vomit looks like coffee grounds. You have bloody or black poop or poop that looks like tar. These symptoms may be an emergency. Get help right away. Call 911. Do not wait to see if the symptoms will go away. Do not drive yourself to the hospital. Summary Viral gastroenteritis is also known as the stomach flu. This condition can cause sudden watery poop (diarrhea), fever, and vomiting. These germs can be passed from person to person very easily. Take an ORS (oral rehydration solution) as told by your doctor. This is a drink that is sold at pharmacies and stores. Wash your hands often, especially after having watery poop or vomiting. If you cannot use soap and water, use hand sanitizer. This information is not intended to replace advice given to you by your health care provider. Make sure you discuss any questions you have with your health care provider. Document Revised: 01/28/2021 Document Reviewed: 01/28/2021 Elsevier Patient Education  2023 [employer].

## 2021-11-23 ENCOUNTER — Emergency Department: Payer: BC Managed Care – PPO

## 2021-11-23 ENCOUNTER — Emergency Department
Admission: EM | Admit: 2021-11-23 | Discharge: 2021-11-23 | Disposition: A | Discharge status: Home / Self Care | Payer: BC Managed Care – PPO | Attending: Emergency Medicine | Admitting: Emergency Medicine

## 2021-11-23 ENCOUNTER — Other Ambulatory Visit: Payer: Self-pay

## 2021-11-23 ENCOUNTER — Encounter: Payer: Self-pay | Admitting: Emergency Medicine

## 2021-11-23 DIAGNOSIS — R110 Nausea: Secondary | ICD-10-CM | POA: Diagnosis not present

## 2021-11-23 DIAGNOSIS — N9489 Other specified conditions associated with female genital organs and menstrual cycle: Secondary | ICD-10-CM | POA: Insufficient documentation

## 2021-11-23 DIAGNOSIS — R1013 Epigastric pain: Secondary | ICD-10-CM | POA: Insufficient documentation

## 2021-11-23 DIAGNOSIS — E8889 Other specified metabolic disorders: Secondary | ICD-10-CM

## 2021-11-23 DIAGNOSIS — R079 Chest pain, unspecified: Secondary | ICD-10-CM | POA: Insufficient documentation

## 2021-11-23 LAB — BASIC METABOLIC PANEL
Anion gap: 8 (ref 5–15)
BUN: 8 mg/dL (ref 6–20)
CO2: 27 mmol/L (ref 22–32)
Calcium: 9.6 mg/dL (ref 8.9–10.3)
Chloride: 105 mmol/L (ref 98–111)
Creatinine, Ser: 0.7 mg/dL (ref 0.44–1.00)
GFR, Estimated: >60 mL/min (ref >60)
Glucose, Bld: 108 mg/dL — ABNORMAL HIGH (ref 70–99)
Potassium: 3.7 mmol/L (ref 3.5–5.1)
Sodium: 140 mmol/L (ref 135–145)

## 2021-11-23 LAB — CBC
HCT: 42.4 % (ref 36.0–46.0)
Hemoglobin: 13.5 g/dL (ref 12.0–15.0)
MCH: 24.5 pg — ABNORMAL LOW (ref 26.0–34.0)
MCHC: 31.8 g/dL (ref 30.0–36.0)
MCV: 76.8 fL — ABNORMAL LOW (ref 80.0–100.0)
Platelets: 433 10*3/uL — ABNORMAL HIGH (ref 150–400)
RBC: 5.52 MIL/uL — ABNORMAL HIGH (ref 3.87–5.11)
RDW: 14.6 % (ref 11.5–15.5)
WBC: 10.6 10*3/uL — ABNORMAL HIGH (ref 4.0–10.5)
nRBC: 0 % (ref 0.0–0.2)

## 2021-11-23 LAB — D-DIMER, QUANTITATIVE: D-Dimer, Quant: <0.27 ug/mL-FEU (ref 0.00–0.50)

## 2021-11-23 LAB — HEPATIC FUNCTION PANEL
ALT: 55 U/L — ABNORMAL HIGH (ref 0–44)
AST: 35 U/L (ref 15–41)
Albumin: 4.1 g/dL (ref 3.5–5.0)
Alkaline Phosphatase: 70 U/L (ref 38–126)
Bilirubin, Direct: <0.1 mg/dL (ref 0.0–0.2)
Total Bilirubin: 0.5 mg/dL (ref 0.3–1.2)
Total Protein: 7.5 g/dL (ref 6.5–8.1)

## 2021-11-23 LAB — TROPONIN I (HIGH SENSITIVITY)
Troponin I (High Sensitivity): <2 ng/L (ref <18)
Troponin I (High Sensitivity): <2 ng/L (ref <18)

## 2021-11-23 LAB — HCG, QUANTITATIVE, PREGNANCY: hCG, Beta Chain, Quant, S: <1 m[IU]/mL (ref <5)

## 2021-11-23 LAB — LIPASE, BLOOD: Lipase: 42 U/L (ref 11–51)

## 2021-11-23 MED ORDER — KETOROLAC TROMETHAMINE 15 MG/ML IJ SOLN
15.0000 mg | Freq: Once | INTRAMUSCULAR | Status: AC
Start: 2021-11-23 — End: 2021-11-23
  Administered 2021-11-23: 15 mg via INTRAVENOUS
  Filled 2021-11-23: qty 1

## 2021-11-23 NOTE — ED Triage Notes (Signed)
Pt reports ongoing chest pain for a few months reports today pain increased. Pt reports she wore a monitor for a month and has not received the results yet. Pt points to her left side, reports sharp pain and pulsating pain. Pt talks in complete sentences no distress noted

## 2021-11-23 NOTE — ED Provider Notes (Signed)
[facility] Provider Note    Event Date/Time   First MD Initiated Contact with Patient 11/23/21 0440     (approximate)   History   Chest Pain   HPI  Linda Chambers is a 26 y.o. 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  She states she has had pains on and off for months in her chest but this is much worse and feels different.  She states that her diarrhea is gotten better and that she has not had any abdominal pain today.  No vomiting, fever, cough, shortness of breath, back pain or other acute sick symptoms.      Physical Exam  Triage Vital Signs: ED Triage Vitals [11/23/21 0033]  Enc Vitals Group     BP (!) 129/95     Pulse Rate (!) 101     Resp 16     Temp 98.5 F (36.9 C)     Temp Source Oral     SpO2 100 %     Weight 241 lb (109.3 kg)     Height 5\' 4"  (1.626 m)     Head Circumference      Peak Flow      Pain Score 8     Pain Loc      Pain Edu?      Excl. in GC?     Most recent vital signs: Vitals:   11/23/21 0033 11/23/21 0313  BP: (!) 129/95 115/85  Pulse: (!) 101 91  Resp: 16 18  Temp: 98.5 F (36.9 C) 98.4 F (36.9 C)  SpO2: 100% 99%    General: Awake, no distress.  CV:  Good peripheral perfusion.  2+ radial pulse.  No significant murmur. Resp:  Normal effort.  Clear bilaterally. Abd:  No distention.  Soft throughout. Other:     ED Results / Procedures / Treatments  Labs (all labs ordered are listed, but only abnormal results are displayed) Labs Reviewed  BASIC METABOLIC PANEL - Abnormal;  Notable for the following components:      Result Value   Glucose, Bld 108 (*)    All other components within normal limits  CBC - Abnormal; Notable for the following components:   WBC 10.6 (*)    RBC 5.52 (*)    MCV 76.8 (*)    MCH 24.5 (*)    Platelets 433 (*)    All other components within normal limits  HEPATIC FUNCTION PANEL - Abnormal; Notable for the following components:   ALT 55 (*)    All other components within normal limits  LIPASE, BLOOD  HCG, QUANTITATIVE, PREGNANCY  D-DIMER, QUANTITATIVE  POC URINE PREG, ED  TROPONIN I (HIGH SENSITIVITY)  TROPONIN I (HIGH SENSITIVITY)     EKG  EKG is remarkable for sinus rhythm with ventricular rate of 91, normal axis, nonspecific ST change versus artifact in lead III, aVF, aVL and aVR without other clear evidence of acute ischemia or significant arrhythmia.   RADIOLOGY Chest reviewed by myself shows no focal consoidation, effusion, edema, pneumothorax or other clear acute thoracic process. I also reviewed radiology interpretation and agree with findings described.  Right upper quadrant ultrasound my interpretation without clear evidence of cholelithiasis or cholecystitis.  I reviewed radiology interpretation and agree to findings that is slightly suboptimally visualized but there is no abnormalities noted.  There is evidence of some steatosis and 5.5 mm common bile duct.  PROCEDURES:  Critical Care performed: No  .1-3 Lead EKG Interpretation  Performed by: Gilles Chiquito, MD Authorized by: Gilles Chiquito, MD     Interpretation: normal     ECG rate assessment: normal     Rhythm: sinus rhythm     Ectopy: none     Conduction: normal     The patient is on the cardiac monitor to evaluate for evidence of arrhythmia and/or significant heart rate changes.   MEDICATIONS ORDERED IN ED: Medications  ketorolac (TORADOL) 15 MG/ML injection 15 mg (15 mg Intravenous Given 11/23/21 0525)     IMPRESSION / MDM / ASSESSMENT  AND PLAN / ED COURSE  I reviewed the triage vital signs and the nursing notes. Patient's presentation is most consistent with acute presentation with potential threat to life or bodily function.                               Differential diagnosis includes, but is not limited to PE, arrhythmia, anemia, ACS, pneumonia, spontaneous pneumothorax, pericarditis, pleurisy, GERD and possible referred pain from gallbladder.  EKG is remarkable for sinus rhythm with ventricular rate of 91, normal axis, nonspecific ST change versus artifact in lead III, aVF, aVL and aVR without other clear evidence of acute ischemia or significant arrhythmia.  Chest reviewed by myself shows no focal consoidation, effusion, edema, pneumothorax or other clear acute thoracic process. I also reviewed radiology interpretation and agree with findings described.  Nonelevated troponin x2 is not suggestive of ACS or myocarditis.  CBC with WC count of 10.6 compared to 11.67-month ago without evidence of acute anemia.  Hepatic function panel unremarkable for evidence of cholestatic process.  ALT is 55 compared to 55 2 weeks ago.  Lipase WNL not suggestive of pancreatitis.  D-dimer is undetectable not suggestive of PE.  Right upper quadrant ultrasound my interpretation without clear evidence of cholelithiasis or cholecystitis.  I reviewed radiology interpretation and agree to findings that is slightly suboptimally visualized but there is no abnormalities noted.  There is evidence of some steatosis and 5.5 mm common bile duct.  No evidence of cholestatic process on labs.  After some Toradol patient is feeling much better.  At this point unclear precise etiology for symptoms although given very reassuring exam work-up patient asymptomatic on my reassessment I think she is appropriate for continued outpatient evaluation.  Discharged in stable condition.  Strict and precautions advised and discussed.  Advised of findings of steatosis on  liver.      FINAL CLINICAL IMPRESSION(S) / ED DIAGNOSES   Final diagnoses:  Chest pain, unspecified type     Rx / DC Orders   ED Discharge Orders     None        Note:  This document was prepared using Dragon voice recognition software and may include unintentional dictation errors.   Gilles Chiquito, MD 11/23/21 [phone]

## 2021-11-23 NOTE — ED Notes (Signed)
Patient discharged at this time. Ambulated to lobby with independent and steady gait. Breathing unlabored speaking in full sentences. Verbalized understanding of all discharge, follow up, and medication teaching. Discharged homed with all belongings.   

## 2021-11-25 ENCOUNTER — Telehealth: Payer: Self-pay | Admitting: Medical

## 2021-11-25 DIAGNOSIS — R000 Tachycardia, unspecified: Secondary | ICD-10-CM

## 2021-11-25 NOTE — Telephone Encounter (Signed)
Pt called for heart monitor results. Please advise pt at [phone]

## 2021-11-25 NOTE — Telephone Encounter (Signed)
Left message for pt concerning recent ER visit. Pt advised to call back.

## 2021-11-26 ENCOUNTER — Other Ambulatory Visit: Payer: Self-pay | Admitting: Medical

## 2021-11-26 MED ORDER — OMEPRAZOLE 40 MG PO CPDR: 40.0000 mg | DELAYED_RELEASE_CAPSULE | Freq: Every day | ORAL | 0 refills | Status: AC

## 2021-11-26 NOTE — Telephone Encounter (Signed)
Sent message through [facility] as patient replies fast this way when she is at work
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
## 2021-12-19 ENCOUNTER — Encounter: Payer: Self-pay | Admitting: Cardiovascular Disease

## 2021-12-19 ENCOUNTER — Other Ambulatory Visit: Payer: Self-pay | Admitting: Medical

## 2021-12-19 ENCOUNTER — Ambulatory Visit: Payer: BC Managed Care – PPO | Attending: Cardiovascular Disease | Admitting: Cardiovascular Disease

## 2021-12-19 VITALS — BP 110/82 | HR 112 | Ht 64.0 in | Wt 246.0 lb

## 2021-12-19 DIAGNOSIS — R000 Tachycardia, unspecified: Secondary | ICD-10-CM

## 2021-12-19 DIAGNOSIS — G90A Postural orthostatic tachycardia syndrome (POTS): Secondary | ICD-10-CM | POA: Diagnosis not present

## 2021-12-19 MED ORDER — PROPRANOLOL HCL 10 MG PO TABS: 10.0000 mg | ORAL_TABLET | Freq: Two times a day (BID) | ORAL | 1 refills | Status: DC

## 2021-12-19 NOTE — Patient Instructions (Signed)
Medication Instructions:  START Propranolol 10 mg twice daily   *If you need a refill on your cardiac medications before your next appointment, please call your pharmacy*   Testing/Procedures: Echocardiogram - Your physician has requested that you have an echocardiogram. Echocardiography is a painless test that uses sound waves to create images of your heart. It provides your doctor with information about the size and shape of your heart and how well your heart's chambers and valves are working. This procedure takes approximately one hour. There are no restrictions for this procedure.     Follow-Up: At [facility], you and your health needs are our priority.  As part of our continuing mission to provide you with exceptional heart care, we have created designated Provider Care Teams.  These Care Teams include your primary Cardiologist (physician) and Advanced Practice Providers (APPs -  Physician Assistants and Nurse Practitioners) who all work together to provide you with the care you need, when you need it.  We recommend signing up for the patient portal called "MyChart".  Sign up information is provided on this After Visit Summary.  MyChart is used to connect with patients for Virtual Visits (Telemedicine).  Patients are able to view lab/test results, encounter notes, upcoming appointments, etc.  Non-urgent messages can be sent to your provider as well.   To learn more about what you can do with MyChart, go to [email or web address].    Your next appointment:   4 month(s)  The format for your next appointment:   In Person  Provider:   Lennie Odor, MD    Postural Orthostatic Tachycardia Syndrome Postural orthostatic tachycardia syndrome (POTS) is a group of symptoms that occur along with an increase in heart rate when a person stands up after lying down. The symptoms include light-headedness or fainting, and they improve when the person lies back down. POTS may be  associated with another medical condition, or it may occur on its own. What are the causes? The cause of this condition is not known, but many conditions and diseases are associated with it. What increases the risk? This condition is more likely to develop in: Women 27-85 years old. Women who are pregnant. Women who are in their period (menstruating). People who have certain conditions, such as: Infection from a virus. Diseases that cause the body's defense system (immune system) to attack healthy organs. These are called autoimmune diseases. Losing a lot of red blood cells (anemia). Losing too much water in the body (dehydration). An overactive thyroid (hyperthyroidism). People who take certain medicines. People who have had a major injury. People who have had surgery. What are the signs or symptoms? The most common symptom of this condition is light-headedness when you stand up from a lying or sitting position. Other symptoms may include: Feeling a rapid increase in the heartbeat (tachycardia) within 10 minutes of standing up. Chest pain. Shortness of breath. Breathing that is deeper and faster than normal (hyperventilation). Fainting. Confusion. Trembling. Weakness. Headache. Anxiety. Nausea. Sweating or flushing. Symptoms may be worse in the morning, and they may be relieved by lying down. How is this diagnosed? This condition is diagnosed based on: Your symptoms. Your medical history. A physical exam. Checking your heart rate when you are lying down and after you stand up. Checking your blood pressure when you go from lying down to standing up. Blood and urine tests to measure hormones that change with blood pressure. The blood tests will be done when you are lying down and  when you are standing up. You may have other tests to check for conditions or diseases that are associated with POTS. How is this treated? Treatment for this condition depends on how severe your  symptoms are and whether you have any conditions or diseases that are associated with POTS. Treatment may involve: Treating any conditions or diseases that are associated with POTS. Drinking two glasses of water before getting up from a lying position. Increasing salt (sodium) in your diet. Taking medicine to control blood pressure and heart rate (beta-blocker). Avoiding certain medicines. Starting an exercise program under the supervision of a health care provider. Follow these instructions at home: Medicines Take over-the-counter and prescription medicines only as told by your health care provider. Let your health care provider know about all prescription or over-the-counter medicines you take. These include herbs, vitamins, and supplements. You may need to stop or adjust some medicines if they cause this condition. Talk with your health care provider before starting any new medicines. Eating and drinking  Drink enough fluid to keep your urine pale yellow. If told by your health care provider, drink two glasses of water before getting up from a lying position. Follow instructions from your health care provider about how much sodium you should include in your diet. Avoid heavy meals. Eat several small meals a day instead of a few large meals. General instructions Do an aerobic exercise for 20 minutes a day, at least 3 days a week. Aerobic exercises are those that cause your heart to beat faster. Ask your health care provider what kinds of exercise are safe for you. Do not use any products that contain nicotine or tobacco. These products include cigarettes, chewing tobacco, and vaping devices, such as e-cigarettes. These can interfere with blood flow. If you need help quitting, ask your health care provider. Keep all follow-up visits. This is important. Contact a health care provider if: Your symptoms do not improve after treatment. Your symptoms get worse. You develop new symptoms. Get  help right away if: You have chest pain. You have difficulty breathing. You have fainting episodes. These symptoms may be an emergency. Get help right away. Call 911. Do not wait to see if the symptoms will go away. Do not drive yourself to the hospital. Summary POTS is a group of symptoms that occur along with an increase in heart rate when a person stands up after lying down. The most common symptom is light-headedness when you stand up. Treatment for this condition includes treating any underlying conditions, drinking plenty of water, stopping or changing some medicines, or starting an exercise program. Get help right away if you have chest pain, difficulty breathing, or fainting episodes. These symptoms may be an emergency. This information is not intended to replace advice given to you by your health care provider. Make sure you discuss any questions you have with your health care provider. Document Revised: 10/11/2020 Document Reviewed: 10/11/2020 Elsevier Patient Education  2023 [employer].

## 2021-12-19 NOTE — Telephone Encounter (Signed)
Has new upcoming appointment with a new office

## 2021-12-25 ENCOUNTER — Telehealth: Payer: Self-pay | Admitting: Cardiovascular Disease

## 2021-12-25 MED ORDER — IVABRADINE HCL 5 MG PO TABS: 5.0000 mg | ORAL_TABLET | Freq: Two times a day (BID) | ORAL | 6 refills | Status: AC

## 2021-12-25 NOTE — Telephone Encounter (Signed)
Pt c/o Syncope: STAT if syncope occurred within 30 minutes and pt complains of lightheadedness High Priority if episode of passing out, completely, today or in last 24 hours   Did you pass out today? Yes   When is the last time you passed out? No    Has this occurred multiple times? No    Did you have any symptoms prior to passing out? Lightheadedness, body felt weighed down, and heart was beating fast.

## 2021-12-25 NOTE — Telephone Encounter (Signed)
Spoke to patient she did not check her B/P.Dr.O'Neal advised to stop Propranolol.Start Ivabradine 5 mg twice a day.Advised to keep appointment with Edd Fabian NP 9/18.

## 2021-12-25 NOTE — Telephone Encounter (Signed)
Received a call from patient she stated she was calling to report she blacked out this afternoon while working at computer.Stated she became dizzy and light headed.No chest pain.She had fast heart beat.She feels ok at present. No fast heart beat.Appointment scheduled with Edd Fabian NP 9/18 at 2:45 pm.Advised to go to ED if needed.I will make Dr.O'Neal aware.
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
## 2021-12-30 ENCOUNTER — Ambulatory Visit: Payer: BC Managed Care – PPO | Admitting: General Practice
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
## 2022-01-01 ENCOUNTER — Ambulatory Visit (INDEPENDENT_AMBULATORY_CARE_PROVIDER_SITE_OTHER): Payer: BC Managed Care – PPO | Admitting: Family Medicine

## 2022-01-01 ENCOUNTER — Encounter: Payer: Self-pay | Admitting: Family Medicine

## 2022-01-01 ENCOUNTER — Ambulatory Visit: Payer: BC Managed Care – PPO | Admitting: Physician Assistant

## 2022-01-01 ENCOUNTER — Ambulatory Visit: Payer: BC Managed Care – PPO | Admitting: Family Medicine

## 2022-01-01 DIAGNOSIS — M791 Myalgia, unspecified site: Secondary | ICD-10-CM | POA: Insufficient documentation

## 2022-01-01 DIAGNOSIS — Q8789 Other specified congenital malformation syndromes, not elsewhere classified: Secondary | ICD-10-CM

## 2022-01-01 DIAGNOSIS — G90A Postural orthostatic tachycardia syndrome (POTS): Secondary | ICD-10-CM

## 2022-01-01 DIAGNOSIS — G43909 Migraine, unspecified, not intractable, without status migrainosus: Secondary | ICD-10-CM | POA: Diagnosis not present

## 2022-01-01 DIAGNOSIS — F3181 Bipolar II disorder: Secondary | ICD-10-CM

## 2022-01-01 DIAGNOSIS — T50B95A Adverse effect of other viral vaccines, initial encounter: Secondary | ICD-10-CM

## 2022-01-01 DIAGNOSIS — Q528 Other specified congenital malformations of female genitalia: Secondary | ICD-10-CM

## 2022-01-01 DIAGNOSIS — L680 Hirsutism: Secondary | ICD-10-CM

## 2022-01-01 MED ORDER — SPIRONOLACTONE 25 MG PO TABS: 50.0000 mg | ORAL_TABLET | Freq: Two times a day (BID) | ORAL | 1 refills | Status: DC

## 2022-01-01 MED ORDER — CELECOXIB 200 MG PO CAPS: 200.0000 mg | ORAL_CAPSULE | Freq: Two times a day (BID) | ORAL | 1 refills | Status: DC

## 2022-01-01 NOTE — Assessment & Plan Note (Signed)
Chronic, recommend trial of spiro to assist if tolerated with blood pressure concerns

## 2022-01-01 NOTE — Assessment & Plan Note (Signed)
Chronic, worsening per pt report Body mass index is 42.57 kg/m. Discussed importance of healthy weight management Discussed diet and exercise

## 2022-01-01 NOTE — Assessment & Plan Note (Signed)
Chronic, stable Previously followed by neuro Currently on Emgality 120 mg and Maxalt 10 mg PRN

## 2022-01-01 NOTE — Assessment & Plan Note (Signed)
Acute on chronic concerns with LOC and orthostatic hypotension s/p COVID 04/2021 with antiviral treatment Followed by cardiology Trial of beta blockers currently in place

## 2022-01-01 NOTE — Patient Instructions (Signed)
It was a pleasure to meet you today; prescriptions have been called in to assist with chronic joint pain and facial hair. Continue to follow with cardiology and monitor your blood pressure.  Additional referrals were placed to both Endocrine and GYN.  Gwyneth Sprout, [place] [address] [place], [place] [address] [phone] (phone) [phone] (fax) [place]

## 2022-01-01 NOTE — Assessment & Plan Note (Signed)
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

## 2022-01-01 NOTE — Assessment & Plan Note (Signed)
Chronic, x 8 months Trial of celebrex BID to assist Previously seen by rheum

## 2022-01-01 NOTE — Assessment & Plan Note (Signed)
Chronic, stable Followed by psych- Linda Chambers

## 2022-01-02 ENCOUNTER — Encounter: Payer: Self-pay | Admitting: Family Medicine

## 2022-01-06 ENCOUNTER — Ambulatory Visit: Payer: BC Managed Care – PPO | Attending: Physician Assistant | Admitting: Physician Assistant

## 2022-01-06 ENCOUNTER — Encounter: Payer: Self-pay | Admitting: Physician Assistant

## 2022-01-06 VITALS — BP 116/82 | HR 72 | Ht 64.0 in | Wt 245.8 lb

## 2022-01-06 DIAGNOSIS — R42 Dizziness and giddiness: Secondary | ICD-10-CM
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
## 2022-01-06 NOTE — Patient Instructions (Signed)
Medication Instructions:  Your physician recommends that you continue on your current medications as directed. Please refer to the Current Medication list given to you today.  *If you need a refill on your cardiac medications before your next appointment, please call your pharmacy*  Please increase your salt intake.    Lab Work: NONE If you have labs (blood work) drawn today and your tests are completely normal, you will receive your results only by: Deputy (if you have MyChart) OR A paper copy in the mail If you have any lab test that is abnormal or we need to change your treatment, we will call you to review the results.   Testing/Procedures: NONE   Follow-Up: At [facility], you and your health needs are our priority.  As part of our continuing mission to provide you with exceptional heart care, we have created designated Provider Care Teams.  These Care Teams include your primary Cardiologist (physician) and Advanced Practice Providers (APPs -  Physician Assistants and Nurse Practitioners) who all work together to provide you with the care you need, when you need it.  We recommend signing up for the patient portal called "MyChart".  Sign up information is provided on this After Visit Summary.  MyChart is used to connect with patients for Virtual Visits (Telemedicine).  Patients are able to view lab/test results, encounter notes, upcoming appointments, etc.  Non-urgent messages can be sent to your provider as well.   To learn more about what you can do with MyChart, go to [email or web address].    Other Instructions Please keep upcoming appointment with Dr.O'Neal.   Important Information About Sugar

## 2022-01-08 ENCOUNTER — Encounter: Payer: Self-pay | Admitting: Physician Assistant

## 2022-01-10 ENCOUNTER — Other Ambulatory Visit: Payer: Self-pay | Admitting: Medical

## 2022-01-10 ENCOUNTER — Other Ambulatory Visit: Payer: Self-pay | Admitting: Family Medicine

## 2022-01-10 ENCOUNTER — Ambulatory Visit: Payer: BC Managed Care – PPO | Admitting: Cardiology

## 2022-01-10 NOTE — Telephone Encounter (Signed)
Requested medication (s) are due for refill today: Yes  Requested medication (s) are on the active medication list: yes  Last refill:  12/19/21  Future visit scheduled: yes  Notes to clinic:  Unable to refill per protocol, not assigned to a protocol. Medication was refused 01/10/22 due to patient not under prescriber care, but patient had a new pt OV 01/01/22 to establish care with Tally Joe, FNP     Requested Prescriptions  Pending Prescriptions Disp Refills   metFORMIN (GLUCOPHAGE) 500 MG tablet [Pharmacy Med Name: METFORMIN HCL 500 MG TABLET] 60 tablet 0    Sig: TAKE 1 TABLET (500 MG TOTAL) BY MOUTH IN THE MORNING AND AT BEDTIME     There is no refill protocol information for this order

## 2022-01-15 ENCOUNTER — Encounter: Payer: Self-pay | Admitting: Family Medicine

## 2022-01-17 ENCOUNTER — Other Ambulatory Visit: Payer: Self-pay | Admitting: Family Medicine

## 2022-01-17 NOTE — Telephone Encounter (Signed)
Medication Refill - Medication: metFORMIN (GLUCOPHAGE) 500 MG tablet  Has the patient contacted their pharmacy? Yes.   Pt stated the Rx refill request was sent to old provider  Preferred Pharmacy (with phone number or street name):  [id number] - [place], [place] Phone:  [phone]  Fax:  [phone]     Has the patient been seen for an appointment in the last year OR does the patient have an upcoming appointment? Yes.    Agent: Please be advised that RX refills may take up to 3 business days. We ask that you follow-up with your pharmacy.

## 2022-01-17 NOTE — Telephone Encounter (Signed)
Requested medication (s) are due for refill today: Yes  Requested medication (s) are on the active medication list: Yes  Last refill:  12/19/21  Future visit scheduled: Yes  Notes to clinic:  Unable to refill per protocol, last refill by another provider.      Requested Prescriptions  Pending Prescriptions Disp Refills   metFORMIN (GLUCOPHAGE) 500 MG tablet 60 tablet 0    Sig: Take 1 tablet (500 mg total) by mouth in the morning and at bedtime.     Endocrinology:  Diabetes - Biguanides Passed - 01/17/2022  4:09 PM      Passed - Cr in normal range and within 360 days    Creatinine, Ser  Date Value Ref Range Status  11/23/2021 0.70 0.44 - 1.00 mg/dL Final         Passed - HBA1C is between 0 and 7.9 and within 180 days    Hgb A1c MFr Bld  Date Value Ref Range Status  11/08/2021 5.6 4.8 - 5.6 % Final    Comment:             Prediabetes: 5.7 - 6.4          Diabetes: >6.4          Glycemic control for adults with diabetes: <7.0          Passed - eGFR in normal range and within 360 days    GFR calc Af Amer  Date Value Ref Range Status  01/04/2020 119 >59 mL/min/1.73 Final    Comment:    **[facility] currently reports eGFR in compliance with the current**   recommendations of the [employer]. [facility] will   update reporting as new guidelines are published from the NKF-ASN   Task force.    GFR, Estimated  Date Value Ref Range Status  11/23/2021 >60 >60 mL/min Final    Comment:    (NOTE) Calculated using the CKD-EPI Creatinine Equation (2021)    eGFR  Date Value Ref Range Status  04/22/2021 125 >59 mL/min/1.73 Final         Passed - B12 Level in normal range and within 720 days    Vitamin B-12  Date Value Ref Range Status  06/21/2021 263 232 - 1,245 pg/mL Final         Passed - Valid encounter within last 6 months    Recent Outpatient Visits           2 weeks ago Morbid obesity [facility])   [facility] Gwyneth Sprout, FNP        Future Appointments             In 1 month Gwyneth Sprout, [place], [place]   In 2 months Ralene Bathe, MD [place]   In 3 months [place], Cassie Freer, MD [facility]            Passed - CBC within normal limits and completed in the last 12 months    WBC  Date Value Ref Range Status  11/23/2021 10.6 (H) 4.0 - 10.5 K/uL Final   RBC  Date Value Ref Range Status  11/23/2021 5.52 (H) 3.87 - 5.11 MIL/uL Final   Hemoglobin  Date Value Ref Range Status  11/23/2021 13.5 12.0 - 15.0 g/dL Final  09/11/2021 13.5 11.1 - 15.9 g/dL Final   HCT  Date Value Ref Range Status  11/23/2021 42.4 36.0 -  46.0 % Final   Hematocrit  Date Value Ref Range Status  09/11/2021 40.9 34.0 - 46.6 % Final   MCHC  Date Value Ref Range Status  11/23/2021 31.8 30.0 - 36.0 g/dL Final   [facility]  Date Value Ref Range Status  11/23/2021 24.5 (L) 26.0 - 34.0 pg Final   MCV  Date Value Ref Range Status  11/23/2021 76.8 (L) 80.0 - 100.0 fL Final  09/11/2021 74 (L) 79 - 97 fL Final   No results found for: "PLTCOUNTKUC", "LABPLAT", "POCPLA" RDW  Date Value Ref Range Status  11/23/2021 14.6 11.5 - 15.5 % Final  09/11/2021 15.8 (H) 11.7 - 15.4 % Final

## 2022-01-18 IMAGING — CT CT ABD-PELV W/ CM
2 of 4 series · 17 of 46 positions shown, 19 images · IV contrast (APPLIED)
Comparison: 05/15/2018

CLINICAL DATA: RIGHT lower quadrant pain intermittently for 1 week,
negative ultrasound

EXAM:
CT ABDOMEN AND PELVIS WITH CONTRAST
TECHNIQUE: Multidetector CT imaging of the abdomen and pelvis was performed
using the standard protocol following bolus administration of
intravenous contrast. Sagittal and coronal MPR images reconstructed
from axial data set.
CONTRAST:  100mL OMNIPAQUE IOHEXOL 300 MG/ML SOLN IV. No oral
contrast.

[Series 3: abd/ pelvis 5.0 i30f 2 · axial · 0.96mm/px · z∈[+752,+1222]mm · 14 of 104 slices shown, 16 images]
[im 5/104  soft-tissue]
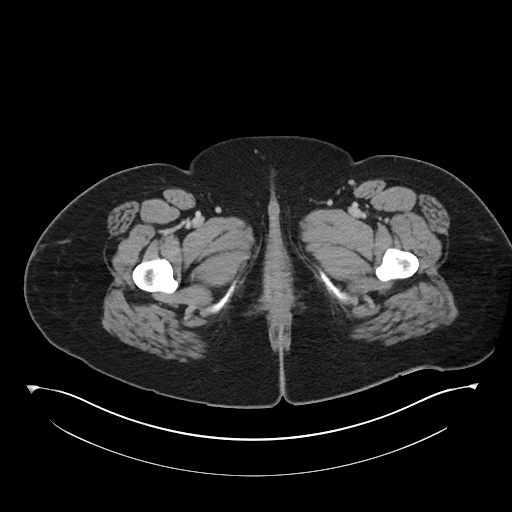
[im 5/104  bone]
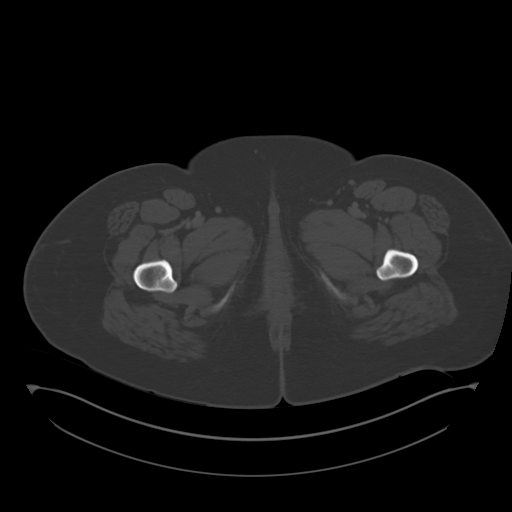
[im 14/104  soft-tissue]
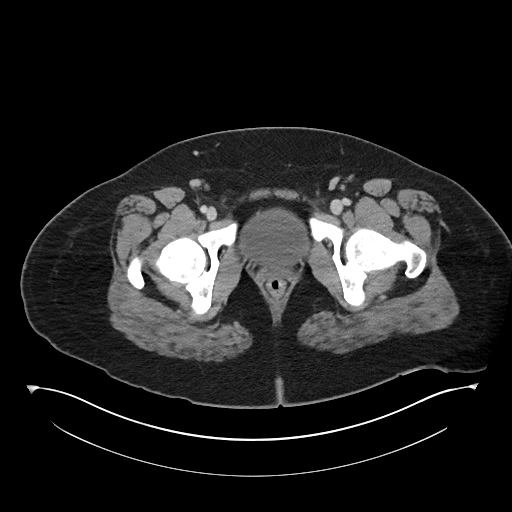
[im 18/104  soft-tissue]
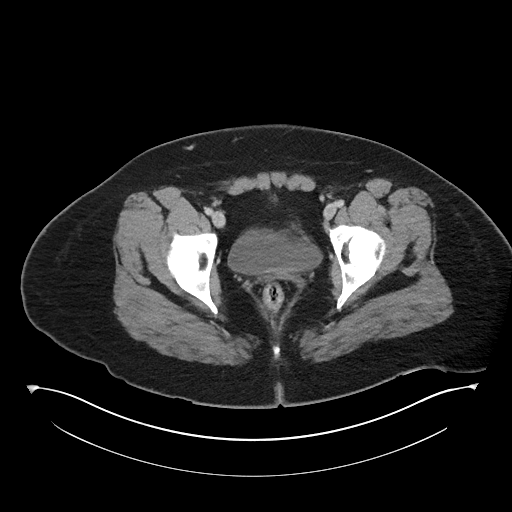
[im 27/104  soft-tissue]
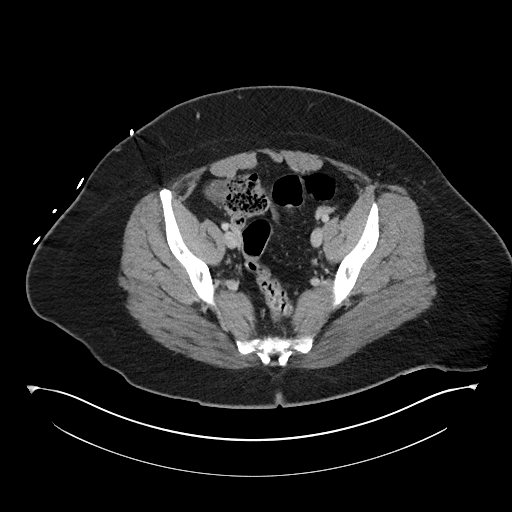
[im 36/104  soft-tissue]
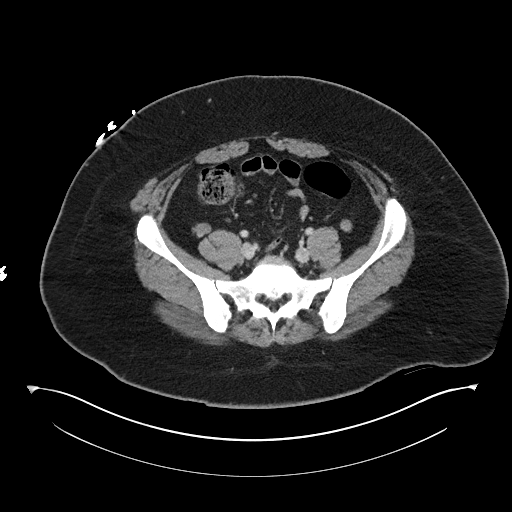
[im 41/104  soft-tissue]
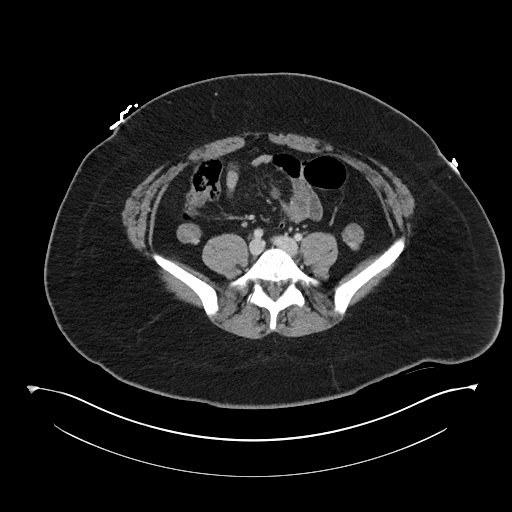
[im 50/104  soft-tissue]
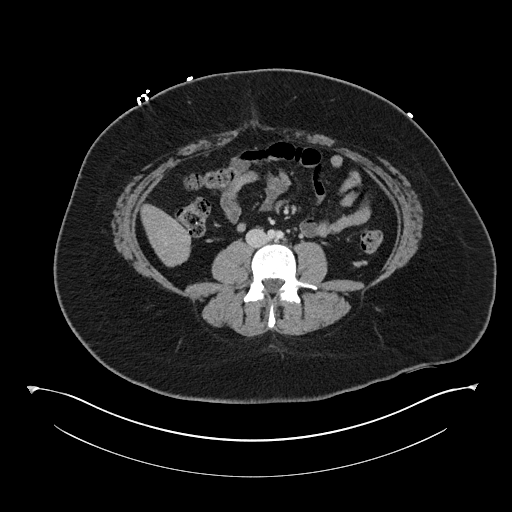
[im 54/104  soft-tissue]
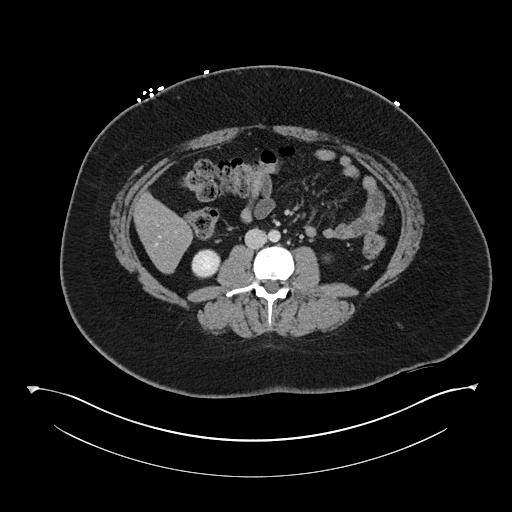
[im 63/104  soft-tissue]
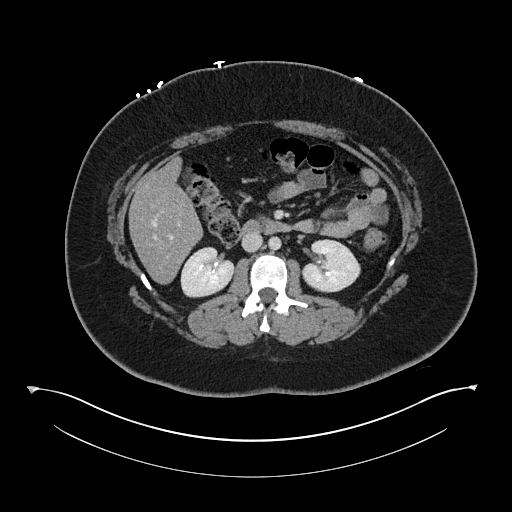
[im 63/104  bone]
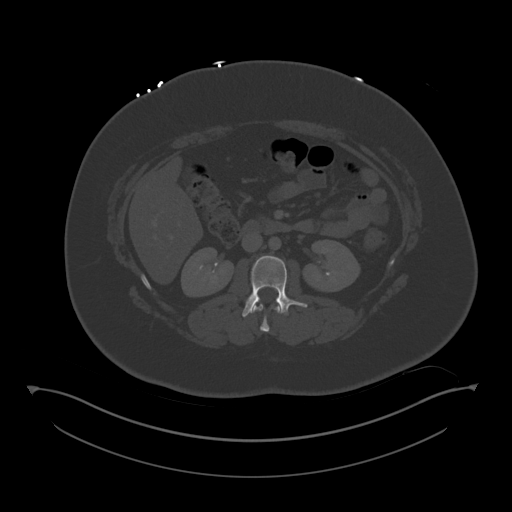
[im 68/104  soft-tissue]
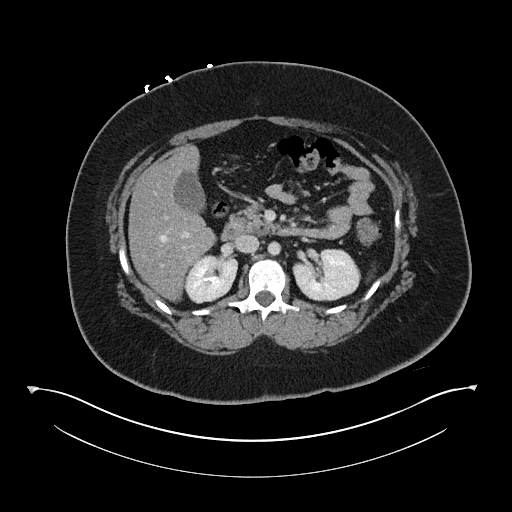
[im 77/104  soft-tissue]
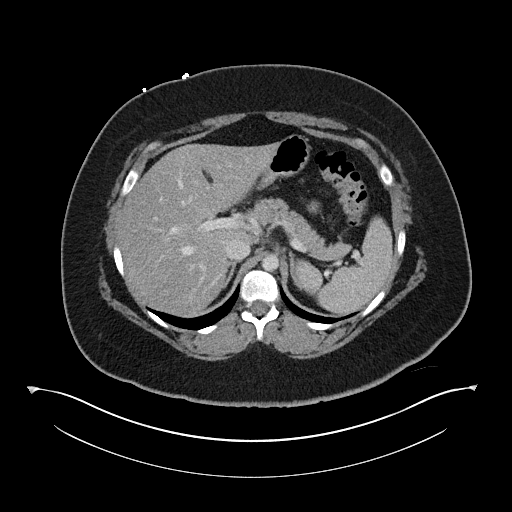
[im 86/104  soft-tissue]
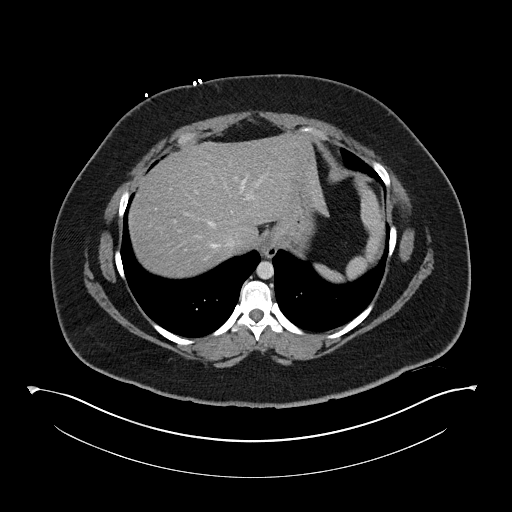
[im 90/104  soft-tissue]
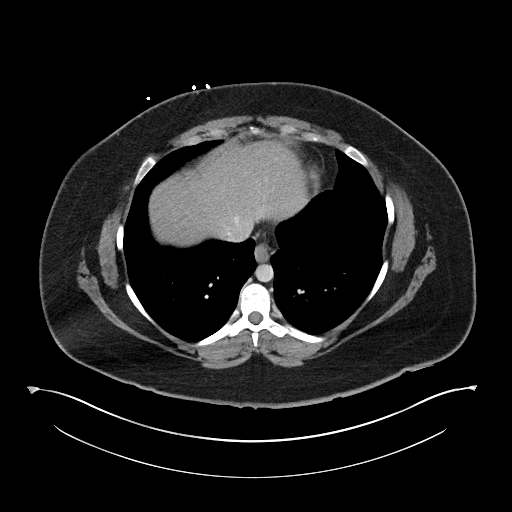
[im 99/104  soft-tissue]
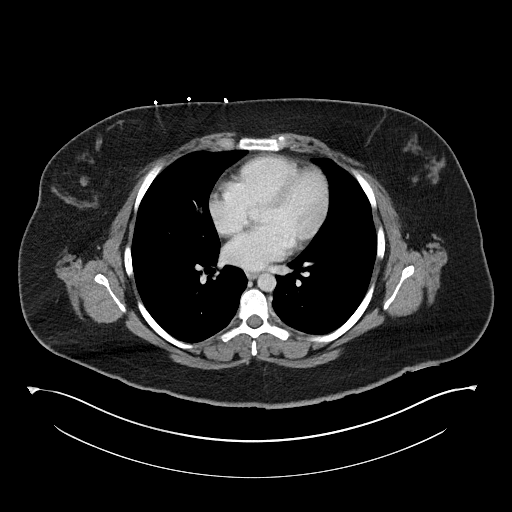

[Series 6: coronal soft tissue · coronal · 0.96mm/px · 3 of 120 slices shown]
[im 40/120  soft-tissue]
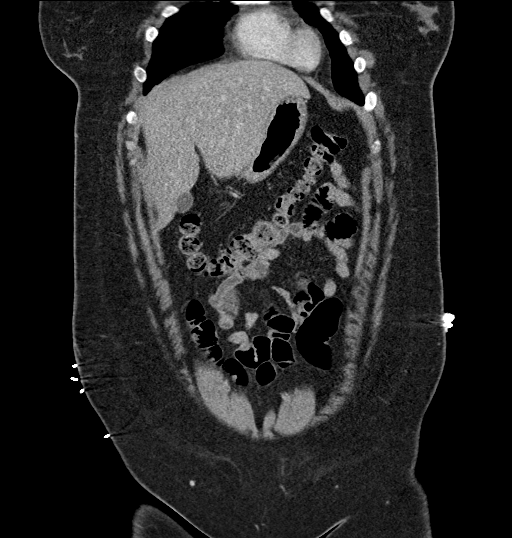
[im 53/120  soft-tissue]
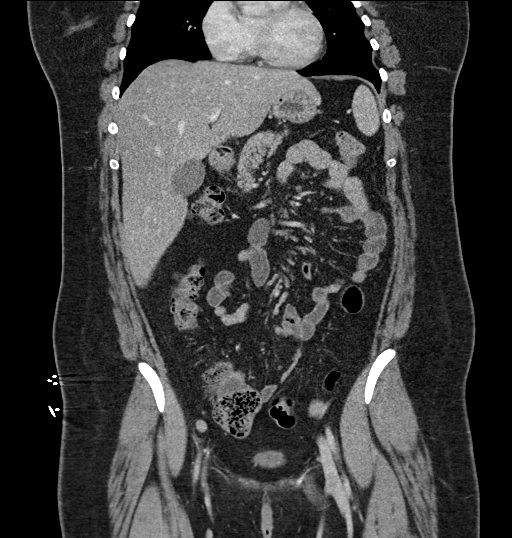
[im 67/120  soft-tissue]
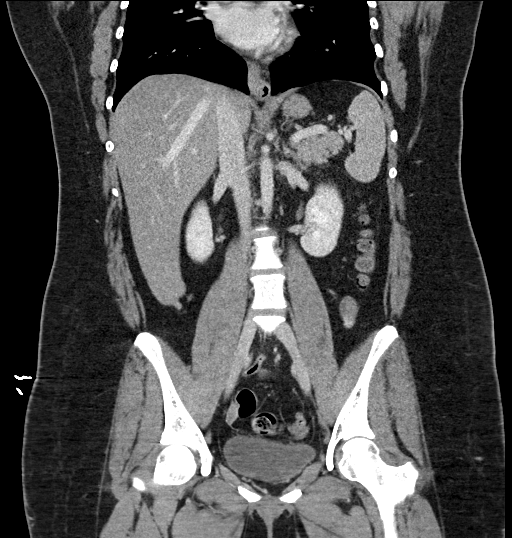

[17 of 46 positions shown; findings below may reference images not displayed]

FINDINGS: Lower chest: Lung bases clear

Hepatobiliary: Diffuse fatty infiltration of liver. Minimal focal
sparing adjacent to gallbladder fossa. No focal hepatic mass. No
calcifications or biliary dilatation.

Pancreas: Normal appearance

Spleen: Normal appearance

Adrenals/Urinary Tract: Adrenal glands, kidneys, ureters, and
bladder normal appearance

Stomach/Bowel: Normal appendix. Stomach and bowel loops normal
appearance

Vascular/Lymphatic: Vascular structures patent.  No adenopathy.

Reproductive: Uterus surgically absent.  Unremarkable ovaries.

Other: No free air or free fluid. No hernia or inflammatory process.

Musculoskeletal: Bone island at anterior column of LEFT acetabulum.
Osseous structures otherwise unremarkable.
IMPRESSION: Fatty infiltration of liver.

Post hysterectomy.

No acute intra-abdominal or intrapelvic abnormalities.

## 2022-01-21 ENCOUNTER — Ambulatory Visit: Payer: BC Managed Care – PPO | Admitting: Physical Therapy

## 2022-01-21 ENCOUNTER — Ambulatory Visit: Payer: BC Managed Care – PPO | Attending: Cardiovascular Disease

## 2022-01-21 DIAGNOSIS — R000 Tachycardia, unspecified: Secondary | ICD-10-CM | POA: Diagnosis not present

## 2022-01-21 DIAGNOSIS — G90A Postural orthostatic tachycardia syndrome (POTS): Secondary | ICD-10-CM | POA: Diagnosis not present

## 2022-01-21 LAB — ECHOCARDIOGRAM COMPLETE
AR max vel: 2.67 cm2
AV Area VTI: 2.78 cm2
AV Area mean vel: 2.7 cm2
AV Mean grad: 4 mmHg
AV Peak grad: 5.9 mmHg
Ao pk vel: 1.21 m/s
Area-P 1/2: 6.48 cm2
S' Lateral: 2.3 cm

## 2022-01-21 MED ORDER — PERFLUTREN LIPID MICROSPHERE
1.0000 mL | INTRAVENOUS | Status: AC | PRN
  Administered 2022-01-21: 2 mL via INTRAVENOUS

## 2022-01-23 ENCOUNTER — Other Ambulatory Visit: Payer: Self-pay | Admitting: Family Medicine

## 2022-01-23 ENCOUNTER — Ambulatory Visit: Payer: BC Managed Care – PPO | Admitting: Family Medicine

## 2022-01-23 DIAGNOSIS — L680 Hirsutism: Secondary | ICD-10-CM

## 2022-01-24 ENCOUNTER — Ambulatory Visit: Payer: Self-pay

## 2022-01-24 NOTE — Telephone Encounter (Addendum)
  Chief Complaint: dizziness- pt concerned having seizure but unsure questions.and vertigo Symptoms: Head gets heavy, and arms and legs feel weighted down. Feels like cannot move arms and legs as fast, lasts 30-40, 104/65 cant remember the incident, mumbles and eyes are open, "Im cold " "I am hurting" "room is echoing"- happens after exertion or after big meal, has POTS syndrome, nausea Frequency: onset 2 months ago- but has had episodes of dizziness since January. Occurs after eating, exertion, Pertinent Negatives: Patient denies fever, chest pain, vomiting, diarrhea, bleeding Disposition: [] ED /[] Urgent Care (no appt availability in office) / [x] Appointment(In office/virtual)/ []  [facility]/ [] Home Care/ [] Refused Recommended Disposition /[] [facility]/ []  Follow-up with PCP Additional Notes: pt asking for VV. Found appt for this Monday with Dr. Caryn Section. Routing to office for provider to review due to sx and pt questioned if having seizures. Pt has POTS. Reason for Disposition  [1] MODERATE dizziness (e.g., interferes with normal activities) AND [2] has been evaluated by doctor (or NP/PA) for this  Answer Assessment - Initial Assessment Questions 1. DESCRIPTION: "Describe your dizziness."     Head gets heavy, and arms and legs feel weighted down. Feels like cannot move arms and legs as fast, lasts 30-40, 104/65 cant remember the incident, mumbles and eyes are open, "Im cold " "I am hurting" "room is echoing"- happens after exertion or after big meal, has POTS syndrome 2. LIGHTHEADED: "Do you feel lightheaded?" (e.g., somewhat faint, woozy, weak upon standing)     no 3. VERTIGO: "Do you feel like either you or the room is spinning or tilting?" (i.e. vertigo)     Yes- yesterday- got finished from a workout, felt like was actually moving when riding bike 4. SEVERITY: "How bad is it?"  "Do you feel like you are going to faint?" "Can you stand and walk?"   - MILD: Feels slightly  dizzy, but walking normally.   - MODERATE: Feels unsteady when walking, but not falling; interferes with normal activities (e.g., school, work).   - SEVERE: Unable to walk without falling, or requires assistance to walk without falling; feels like passing out now.      She sits down when she feels like she is going to pass out when the episodes happen 5. ONSET:  "When did the dizziness begin?"     2 months ago - but dizziness began in January 6. AGGRAVATING FACTORS: "Does anything make it worse?" (e.g., standing, change in head position)     After eating 7. HEART RATE: "Can you tell me your heart rate?" "How many beats in 15 seconds?"  (Note: not all patients can do this)       N/a 8. CAUSE: "What do you think is causing the dizziness?"     unsure 9. RECURRENT SYMPTOM: "Have you had dizziness before?" If Yes, ask: "When was the last time?" "What happened that time?"     Yes January- comes and goes-  10. OTHER SYMPTOMS: "Do you have any other symptoms?" (e.g., fever, chest pain, vomiting, diarrhea, bleeding)       nausea 11. PREGNANCY: "Is there any chance you are pregnant?" "When was your last menstrual period?"       N/a  Protocols used: Dizziness - Lightheadedness-A-AH

## 2022-01-25 ENCOUNTER — Other Ambulatory Visit: Payer: Self-pay | Admitting: Family Medicine

## 2022-01-27 ENCOUNTER — Telehealth (INDEPENDENT_AMBULATORY_CARE_PROVIDER_SITE_OTHER): Payer: BC Managed Care – PPO | Admitting: Family Medicine

## 2022-01-27 DIAGNOSIS — E221 Hyperprolactinemia: Secondary | ICD-10-CM

## 2022-01-27 DIAGNOSIS — R202 Paresthesia of skin: Secondary | ICD-10-CM

## 2022-01-27 DIAGNOSIS — R29818 Other symptoms and signs involving the nervous system: Secondary | ICD-10-CM

## 2022-01-27 DIAGNOSIS — L680 Hirsutism: Secondary | ICD-10-CM

## 2022-01-27 NOTE — Progress Notes (Signed)
MyChart Video Visit    Virtual Visit via Video Note   This visit type was conducted due to national recommendations for restrictions regarding the COVID-19 Pandemic (e.g. social distancing) in an effort to limit this patient's exposure and mitigate transmission in our community. This patient is at least at moderate risk for complications without adequate follow up. This format is felt to be most appropriate for this patient at this time. Physical exam was limited by quality of the video and audio technology used for the visit.   Patient location: home Provider location: [facility]  I discussed the limitations of evaluation and management by telemedicine and the availability of in person appointments. The patient expressed understanding and agreed to proceed.  Patient: Linda Chambers   DOB: Nov 24, 1995   26 y.o. Female  MRN: [id number] Visit Date: 01/27/2022  Today's healthcare provider: Lelon Huh, MD   Chief Complaint  Patient presents with   Dizziness   Subjective    Dizziness Episode onset: 2 months ago. Pertinent negatives include no abdominal pain, chest pain, chills, fatigue, fever, nausea, vomiting or weakness.    During the episodes of dizziness she feels like her head is heavy, and arms and legs feel weighted down. Feels like she cannot move arms and legs as fast; usually lasts 30-40 minutes. Symptoms occur after exertion or after large meal. She states she is told that those around her tell her she looks like she is sleeping and is unresponsive. States the episode have been occurring for 2-3 months and becoming more frequent and longer lasting. She also states that her blood pressure usually runs 120/70, but during these dizzy episodes her blood pressure drops below her baseline average. She reports that her blood pressure once dropped to 102/59 during the dizzy episode.   She Patient reports having a past history of POTS syndrome.States that this was diagnosed by cardiology (Dr.  Audie Box) a few months ago. Reports that she was found to have tachycardia on event monitor and stared on betablocker.   Was started on spironolactone about a month ago for hirsutism.  Sees Dr. Krista Blue for migraine   Had MRI 11/2020 for elevated prolactin .   Medications: Outpatient Medications Prior to Visit  Medication Sig   B Complex Vitamins (VITAMIN-B COMPLEX PO) Take by mouth.   busPIRone (BUSPAR) 5 MG tablet Take 5 mg by mouth at bedtime.   celecoxib (CELEBREX) 200 MG capsule Take 1 capsule (200 mg total) by mouth 2 (two) times daily.   cetirizine (ZYRTEC) 10 MG tablet Take 1 tablet by mouth at bedtime.   Galcanezumab-gnlm (EMGALITY) 120 MG/ML SOAJ Inject 120 mg into the skin every 30 (thirty) days.   ivabradine (CORLANOR) 5 MG TABS tablet Take 1 tablet (5 mg total) by mouth 2 (two) times daily with a meal.   metFORMIN (GLUCOPHAGE) 500 MG tablet TAKE 1 TABLET (500 MG TOTAL) BY MOUTH IN THE MORNING AND AT BEDTIME   Multiple Vitamin (MULTIVITAMIN) tablet Take 1 tablet by mouth at bedtime.   omeprazole (PRILOSEC) 40 MG capsule Take 1 capsule (40 mg total) by mouth daily.   ondansetron (ZOFRAN-ODT) 4 MG disintegrating tablet Take 1 tablet (4 mg total) by mouth every 8 (eight) hours as needed for nausea or vomiting. (Patient taking differently: Take 4 mg by mouth as needed for nausea or vomiting.)   rizatriptan (MAXALT-MLT) 10 MG disintegrating tablet Take 1 tablet (10 mg total) by mouth as needed for migraine. May repeat in 2 hours if needed  spironolactone (ALDACTONE) 25 MG tablet Take 2 tablets (50 mg total) by mouth 2 (two) times daily.   VRAYLAR 1.5 MG capsule Take 1.5 mg by mouth at bedtime.   No facility-administered medications prior to visit.    Review of Systems  Constitutional:  Negative for appetite change, chills, fatigue and fever.  Respiratory:  Negative for chest tightness and shortness of breath.   Cardiovascular:  Negative for chest pain and palpitations.   Gastrointestinal:  Negative for abdominal pain, nausea and vomiting.  Neurological:  Positive for dizziness. Negative for weakness.       Objective    There were no vitals taken for this visit.     Physical Exam   Awake, alert, oriented x 3. In no apparent distress   Assessment & Plan     1. Transient neurologic deficit She is concerned about episodes being seizures such as petit mal, although they seem to be unusually long lasting.   - CBC - Comprehensive metabolic panel - Magnesium - TSH   Consider neuroimaging. Has been followed by Dr. Terrace Arabia for migraines. Consider referral for this new problem.   2. Morbid obesity (HCC)  - CBC - Comprehensive metabolic panel - Magnesium - TSH - Vitamin B12 - VITAMIN D 25 Hydroxy (Vit-D Deficiency, Fractures)  3. Hirsutism  - Comprehensive metabolic panel  4. Paresthesia - Vitamin B12 - VITAMIN D 25 Hydroxy (Vit-D Deficiency, Fractures)  5. Hyperprolactinemia (HCC) Did have MRI last year, but symptoms being addressed today started since then.      I discussed the assessment and treatment plan with the patient. The patient was provided an opportunity to ask questions and all were answered. The patient agreed with the plan and demonstrated an understanding of the instructions.   The patient was advised to call back or seek an in-person evaluation if the symptoms worsen or if the condition fails to improve as anticipated.  I provided 20 minutes of non-face-to-face time during this encounter.  The entirety of the information documented in the History of Present Illness, Review of Systems and Physical Exam were personally obtained by me. Portions of this information were initially documented by the CMA and reviewed by me for thoroughness and accuracy.    Mila Merry, MD [facility] [phone] (phone) [phone] (fax)  [facility]

## 2022-01-29 ENCOUNTER — Other Ambulatory Visit: Payer: Self-pay | Admitting: Family Medicine

## 2022-01-29 ENCOUNTER — Telehealth: Payer: Self-pay | Admitting: *Deleted

## 2022-01-29 DIAGNOSIS — R29818 Other symptoms and signs involving the nervous system: Secondary | ICD-10-CM

## 2022-01-29 LAB — COMPREHENSIVE METABOLIC PANEL
ALT: 40 IU/L — ABNORMAL HIGH (ref 0–32)
AST: 21 IU/L (ref 0–40)
Albumin/Globulin Ratio: 1.7 (ref 1.2–2.2)
Albumin: 4.5 g/dL (ref 4.0–5.0)
Alkaline Phosphatase: 85 IU/L (ref 44–121)
BUN/Creatinine Ratio: 21 (ref 9–23)
BUN: 15 mg/dL (ref 6–20)
Bilirubin Total: <0.2 mg/dL (ref 0.0–1.2)
CO2: 27 mmol/L (ref 20–29)
Calcium: 10.3 mg/dL — ABNORMAL HIGH (ref 8.7–10.2)
Chloride: 99 mmol/L (ref 96–106)
Creatinine, Ser: 0.71 mg/dL (ref 0.57–1.00)
Globulin, Total: 2.7 g/dL (ref 1.5–4.5)
Glucose: 90 mg/dL (ref 70–99)
Potassium: 4.5 mmol/L (ref 3.5–5.2)
Sodium: 139 mmol/L (ref 134–144)
Total Protein: 7.2 g/dL (ref 6.0–8.5)
eGFR: 122 mL/min/{1.73_m2} (ref >59)

## 2022-01-29 LAB — CBC
Hematocrit: 41.5 % (ref 34.0–46.6)
Hemoglobin: 12.7 g/dL (ref 11.1–15.9)
MCH: 23.4 pg — ABNORMAL LOW (ref 26.6–33.0)
MCHC: 30.6 g/dL — ABNORMAL LOW (ref 31.5–35.7)
MCV: 77 fL — ABNORMAL LOW (ref 79–97)
Platelets: 509 10*3/uL — ABNORMAL HIGH (ref 150–450)
RBC: 5.42 x10E6/uL — ABNORMAL HIGH (ref 3.77–5.28)
RDW: 14.6 % (ref 11.7–15.4)
WBC: 10.8 10*3/uL (ref 3.4–10.8)

## 2022-01-29 LAB — MAGNESIUM: Magnesium: 2 mg/dL (ref 1.6–2.3)

## 2022-01-29 LAB — VITAMIN B12: Vitamin B-12: 324 pg/mL (ref 232–1245)

## 2022-01-29 LAB — VITAMIN D 25 HYDROXY (VIT D DEFICIENCY, FRACTURES): Vit D, 25-Hydroxy: 21.5 ng/mL — ABNORMAL LOW (ref 30.0–100.0)

## 2022-01-29 LAB — TSH: TSH: 3.04 u[IU]/mL (ref 0.450–4.500)

## 2022-01-29 NOTE — Telephone Encounter (Signed)
Copied from [place] [id number]. Topic: General - Other >> Jan 29, 2022  3:06 PM Ja-Kwan M wrote: Reason for CRM: Pt requests call back to go over most recent lab results. Cb# [phone]

## 2022-01-29 NOTE — Telephone Encounter (Signed)
Please advise lab results.

## 2022-01-30 ENCOUNTER — Ambulatory Visit: Payer: BC Managed Care – PPO | Admitting: Adult Health

## 2022-02-04 ENCOUNTER — Ambulatory Visit: Payer: BC Managed Care – PPO | Admitting: Physical Therapy

## 2022-02-13 ENCOUNTER — Encounter: Payer: Self-pay | Admitting: Family Medicine

## 2022-02-23 ENCOUNTER — Other Ambulatory Visit: Payer: Self-pay | Admitting: Medical

## 2022-02-23 ENCOUNTER — Other Ambulatory Visit: Payer: Self-pay | Admitting: Family Medicine

## 2022-02-23 DIAGNOSIS — L680 Hirsutism: Secondary | ICD-10-CM

## 2022-02-24 ENCOUNTER — Other Ambulatory Visit: Payer: Self-pay | Admitting: Family Medicine

## 2022-02-24 DIAGNOSIS — T50B95A Adverse effect of other viral vaccines, initial encounter: Secondary | ICD-10-CM

## 2022-02-25 NOTE — Progress Notes (Unsigned)
      Established patient visit   Patient: Linda Chambers   DOB: Aug 13, 1995   26 y.o. Female  MRN: [id number] Visit Date: 03/05/2022  Today's healthcare provider: Jacky Kindle, FNP   No chief complaint on file.  Subjective    HPI  ***  Medications: Outpatient Medications Prior to Visit  Medication Sig   B Complex Vitamins (VITAMIN-B COMPLEX PO) Take by mouth.   busPIRone (BUSPAR) 5 MG tablet Take 5 mg by mouth at bedtime.   celecoxib (CELEBREX) 200 MG capsule Take 1 capsule (200 mg total) by mouth 2 (two) times daily.   cetirizine (ZYRTEC) 10 MG tablet Take 1 tablet by mouth at bedtime.   Galcanezumab-gnlm (EMGALITY) 120 MG/ML SOAJ Inject 120 mg into the skin every 30 (thirty) days.   ivabradine (CORLANOR) 5 MG TABS tablet Take 1 tablet (5 mg total) by mouth 2 (two) times daily with a meal.   metFORMIN (GLUCOPHAGE) 500 MG tablet TAKE 1 TABLET (500 MG TOTAL) BY MOUTH IN THE MORNING AND AT BEDTIME   Multiple Vitamin (MULTIVITAMIN) tablet Take 1 tablet by mouth at bedtime.   omeprazole (PRILOSEC) 40 MG capsule Take 1 capsule (40 mg total) by mouth daily.   ondansetron (ZOFRAN-ODT) 4 MG disintegrating tablet Take 1 tablet (4 mg total) by mouth every 8 (eight) hours as needed for nausea or vomiting. (Patient taking differently: Take 4 mg by mouth as needed for nausea or vomiting.)   rizatriptan (MAXALT-MLT) 10 MG disintegrating tablet Take 1 tablet (10 mg total) by mouth as needed for migraine. May repeat in 2 hours if needed   spironolactone (ALDACTONE) 25 MG tablet TAKE 2 TABLETS BY MOUTH 2 TIMES DAILY.   VRAYLAR 1.5 MG capsule Take 1.5 mg by mouth at bedtime.   No facility-administered medications prior to visit.    Review of Systems  {Labs  Heme  Chem  Endocrine  Serology  Results Review (optional):23779}   Objective    There were no vitals taken for this visit. {Show previous vital signs (optional):23777}  Physical Exam  ***  No results found for any visits on  03/05/22.  Assessment & Plan     ***  No follow-ups on file.      {provider attestation***:1}   Jacky Kindle, FNP  [facility] [phone] (phone) [phone] (fax)  [facility]

## 2022-03-04 ENCOUNTER — Encounter: Payer: Self-pay | Admitting: Adult Health

## 2022-03-04 ENCOUNTER — Ambulatory Visit: Payer: BC Managed Care – PPO | Admitting: Adult Health

## 2022-03-04 MED ORDER — EMGALITY 120 MG/ML ~~LOC~~ SOSY: 120.0000 mg | PREFILLED_SYRINGE | SUBCUTANEOUS | 2 refills | Status: AC

## 2022-03-04 NOTE — Telephone Encounter (Signed)
If emgality was working for her then I would continue it. If not then we can try qulipta

## 2022-03-04 NOTE — Telephone Encounter (Signed)
Emgality 120 mg SOSY sent to pharmacy.

## 2022-03-05 ENCOUNTER — Ambulatory Visit (INDEPENDENT_AMBULATORY_CARE_PROVIDER_SITE_OTHER): Payer: BC Managed Care – PPO | Admitting: Family Medicine

## 2022-03-05 ENCOUNTER — Encounter: Payer: Self-pay | Admitting: Family Medicine

## 2022-03-05 DIAGNOSIS — R7303 Prediabetes: Secondary | ICD-10-CM | POA: Diagnosis not present

## 2022-03-05 DIAGNOSIS — E221 Hyperprolactinemia: Secondary | ICD-10-CM

## 2022-03-05 DIAGNOSIS — Z13220 Encounter for screening for lipoid disorders: Secondary | ICD-10-CM

## 2022-03-05 DIAGNOSIS — G43909 Migraine, unspecified, not intractable, without status migrainosus: Secondary | ICD-10-CM

## 2022-03-05 MED ORDER — METFORMIN HCL ER 750 MG PO TB24: 750.0000 mg | ORAL_TABLET | Freq: Every day | ORAL | 3 refills | Status: AC

## 2022-03-05 NOTE — Assessment & Plan Note (Signed)
Reports out of medication d/t national shortage Followed by neurology Concerned for seizure possibility; has not had EEG monitoring

## 2022-03-05 NOTE — Assessment & Plan Note (Signed)
Request for LP given concerns for chronic atypical chest pain; unable to do EKG given no machine availability Pt denies change in CP; reports that this is her daily atypical chest pain

## 2022-03-05 NOTE — Assessment & Plan Note (Signed)
Chronic, stable Body mass index is 43.6 kg/m. Pt concerned for weight gain recently

## 2022-03-05 NOTE — Assessment & Plan Note (Signed)
Chronic, stable Previously followed by Endocrine Last endocrine labs were all stable

## 2022-03-05 NOTE — Assessment & Plan Note (Signed)
Chronic, stable Restart Metformin to assist

## 2022-03-06 LAB — BASIC METABOLIC PANEL
BUN/Creatinine Ratio: 21 (ref 9–23)
BUN: 14 mg/dL (ref 6–20)
CO2: 25 mmol/L (ref 20–29)
Calcium: 9.7 mg/dL (ref 8.7–10.2)
Chloride: 100 mmol/L (ref 96–106)
Creatinine, Ser: 0.68 mg/dL (ref 0.57–1.00)
Glucose: 95 mg/dL (ref 70–99)
Potassium: 4.8 mmol/L (ref 3.5–5.2)
Sodium: 139 mmol/L (ref 134–144)
eGFR: 125 mL/min/{1.73_m2} (ref >59)

## 2022-03-06 LAB — CBC
Hematocrit: 42.2 % (ref 34.0–46.6)
Hemoglobin: 13.6 g/dL (ref 11.1–15.9)
MCH: 24.6 pg — ABNORMAL LOW (ref 26.6–33.0)
MCHC: 32.2 g/dL (ref 31.5–35.7)
MCV: 76 fL — ABNORMAL LOW (ref 79–97)
Platelets: 423 10*3/uL (ref 150–450)
RBC: 5.52 x10E6/uL — ABNORMAL HIGH (ref 3.77–5.28)
RDW: 15.7 % — ABNORMAL HIGH (ref 11.7–15.4)
WBC: 9.3 10*3/uL (ref 3.4–10.8)

## 2022-03-06 LAB — LIPID PANEL
Chol/HDL Ratio: 4.6 ratio — ABNORMAL HIGH (ref 0.0–4.4)
Cholesterol, Total: 208 mg/dL — ABNORMAL HIGH (ref 100–199)
HDL: 45 mg/dL (ref >39)
LDL Chol Calc (NIH): 116 mg/dL — ABNORMAL HIGH (ref 0–99)
Triglycerides: 271 mg/dL — ABNORMAL HIGH (ref 0–149)
VLDL Cholesterol Cal: 47 mg/dL — ABNORMAL HIGH (ref 5–40)

## 2022-03-06 LAB — HEMOGLOBIN A1C
Est. average glucose Bld gHb Est-mCnc: 123 mg/dL
Hgb A1c MFr Bld: 5.9 % — ABNORMAL HIGH (ref 4.8–5.6)

## 2022-03-07 NOTE — Progress Notes (Signed)
Cell count remains stable. Continue to recommend follow up with hematology for further workup.  Pre-diabetes has returned without Metformin. Continue restart as discussed. Continue to recommend balanced, lower carb meals. Smaller meal size, adding snacks. Choosing water as drink of choice and increasing purposeful exercise.  Cholesterol panel is increased; I continue to recommend diet low in saturated fat and regular exercise - 30 min at least 5 times per week  Jacky Kindle, FNP  [facility] [address] [place], [place] [address] [phone] (phone) [phone] (fax) [facility]

## 2022-03-10 ENCOUNTER — Emergency Department: Payer: BC Managed Care – PPO

## 2022-03-10 ENCOUNTER — Encounter: Payer: Self-pay | Admitting: *Deleted

## 2022-03-10 ENCOUNTER — Encounter: Payer: Self-pay | Admitting: Emergency Medicine

## 2022-03-10 DIAGNOSIS — G43809 Other migraine, not intractable, without status migrainosus: Secondary | ICD-10-CM | POA: Diagnosis not present

## 2022-03-10 DIAGNOSIS — R519 Headache, unspecified: Secondary | ICD-10-CM | POA: Diagnosis present

## 2022-03-10 DIAGNOSIS — J45909 Unspecified asthma, uncomplicated: Secondary | ICD-10-CM | POA: Insufficient documentation

## 2022-03-10 NOTE — ED Provider Triage Note (Signed)
Emergency Medicine Provider Triage Evaluation Note  Linda Chambers , a 26 y.o. female  was evaluated in triage.  Pt complains of left-sided headache behind her left eye.  Patient states that headache started on Sunday and seemed dull initially but then progressed in intensity rapidly.  Patient states that she has tried her Imitrex with no relief.  She states that this is an atypical headache in comparison to her normal.  No fever or chills at home.  No neck pain.  Review of Systems  Positive: Patient has migraine.  Negative: No chest pain or abdominal pain.   Physical Exam  Ht 5\' 4"  (1.626 m)   Wt 113.4 kg   BMI 42.91 kg/m  Gen:   Awake, no distress   Resp:  Normal effort  MSK:   Moves extremities without difficulty  Other:    Medical Decision Making  Medically screening exam initiated at 9:39 PM.  Appropriate orders placed.  Linda Chambers was informed that the remainder of the evaluation will be completed by another provider, this initial triage assessment does not replace that evaluation, and the importance of remaining in the ED until their evaluation is complete.     Linda Chambers [place], [place] 03/10/22 2140

## 2022-03-10 NOTE — ED Triage Notes (Signed)
Pt presents via POV with complaints of migraine behind her left eye with associated nausea. Endorses a dull pain for the first 2 days and tonight she developed a "sharp/throbbing" pain. Hx of migraines - notes taking preventing medications without improvement. Denies CP or SOB.

## 2022-03-11 ENCOUNTER — Emergency Department
Admission: EM | Admit: 2022-03-11 | Discharge: 2022-03-11 | Disposition: A | Discharge status: Home / Self Care | Payer: BC Managed Care – PPO | Attending: Emergency Medicine | Admitting: Emergency Medicine

## 2022-03-11 DIAGNOSIS — G43809 Other migraine, not intractable, without status migrainosus: Secondary | ICD-10-CM

## 2022-03-11 MED ORDER — KETOROLAC TROMETHAMINE 15 MG/ML IJ SOLN
15.0000 mg | Freq: Once | INTRAMUSCULAR | Status: AC
  Administered 2022-03-11: 15 mg via INTRAVENOUS
  Filled 2022-03-11: qty 1

## 2022-03-11 MED ORDER — METOCLOPRAMIDE HCL 5 MG/ML IJ SOLN
10.0000 mg | Freq: Once | INTRAMUSCULAR | Status: AC
  Administered 2022-03-11: 10 mg via INTRAVENOUS
  Filled 2022-03-11: qty 2
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
## 2022-03-11 NOTE — ED Notes (Signed)
Patient discharged to home per MD order. Patient in stable condition, and deemed medically cleared by ED provider for discharge. Discharge instructions reviewed with patient/family using "Teach Back"; verbalized understanding of medication education and administration, and information about follow-up care. Denies further concerns. ° °

## 2022-03-19 ENCOUNTER — Ambulatory Visit: Payer: BC Managed Care – PPO | Admitting: Dermatology

## 2022-03-26 ENCOUNTER — Encounter: Payer: Self-pay | Admitting: Family Medicine

## 2022-03-26 DIAGNOSIS — E559 Vitamin D deficiency, unspecified: Secondary | ICD-10-CM | POA: Insufficient documentation

## 2022-03-27 ENCOUNTER — Institutional Professional Consult (permissible substitution): Payer: BC Managed Care – PPO | Admitting: Neurology
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
## 2022-04-23 ENCOUNTER — Ambulatory Visit: Payer: BC Managed Care – PPO | Attending: Cardiovascular Disease | Admitting: Cardiovascular Disease

## 2022-04-23 DIAGNOSIS — G90A Postural orthostatic tachycardia syndrome (POTS): Secondary | ICD-10-CM

## 2022-04-29 ENCOUNTER — Ambulatory Visit: Payer: BC Managed Care – PPO | Admitting: Neurology

## 2022-06-20 ENCOUNTER — Encounter: Payer: Self-pay | Admitting: Cardiovascular Disease

## 2022-07-16 ENCOUNTER — Encounter: Payer: Self-pay | Admitting: Adult Health

## 2023-07-07 IMAGING — CR DG CHEST 2V
1 series · 2 of 2 positions shown · non-contrast
Comparison: None Available.

CLINICAL DATA: Chest pain, left arm pain and tingling since
yesterday

EXAM:
CHEST - 2 VIEW

[Series 1: dg chest 2 view · 0.14mm/px · 2 of 2 slices shown]
[im 1/2]
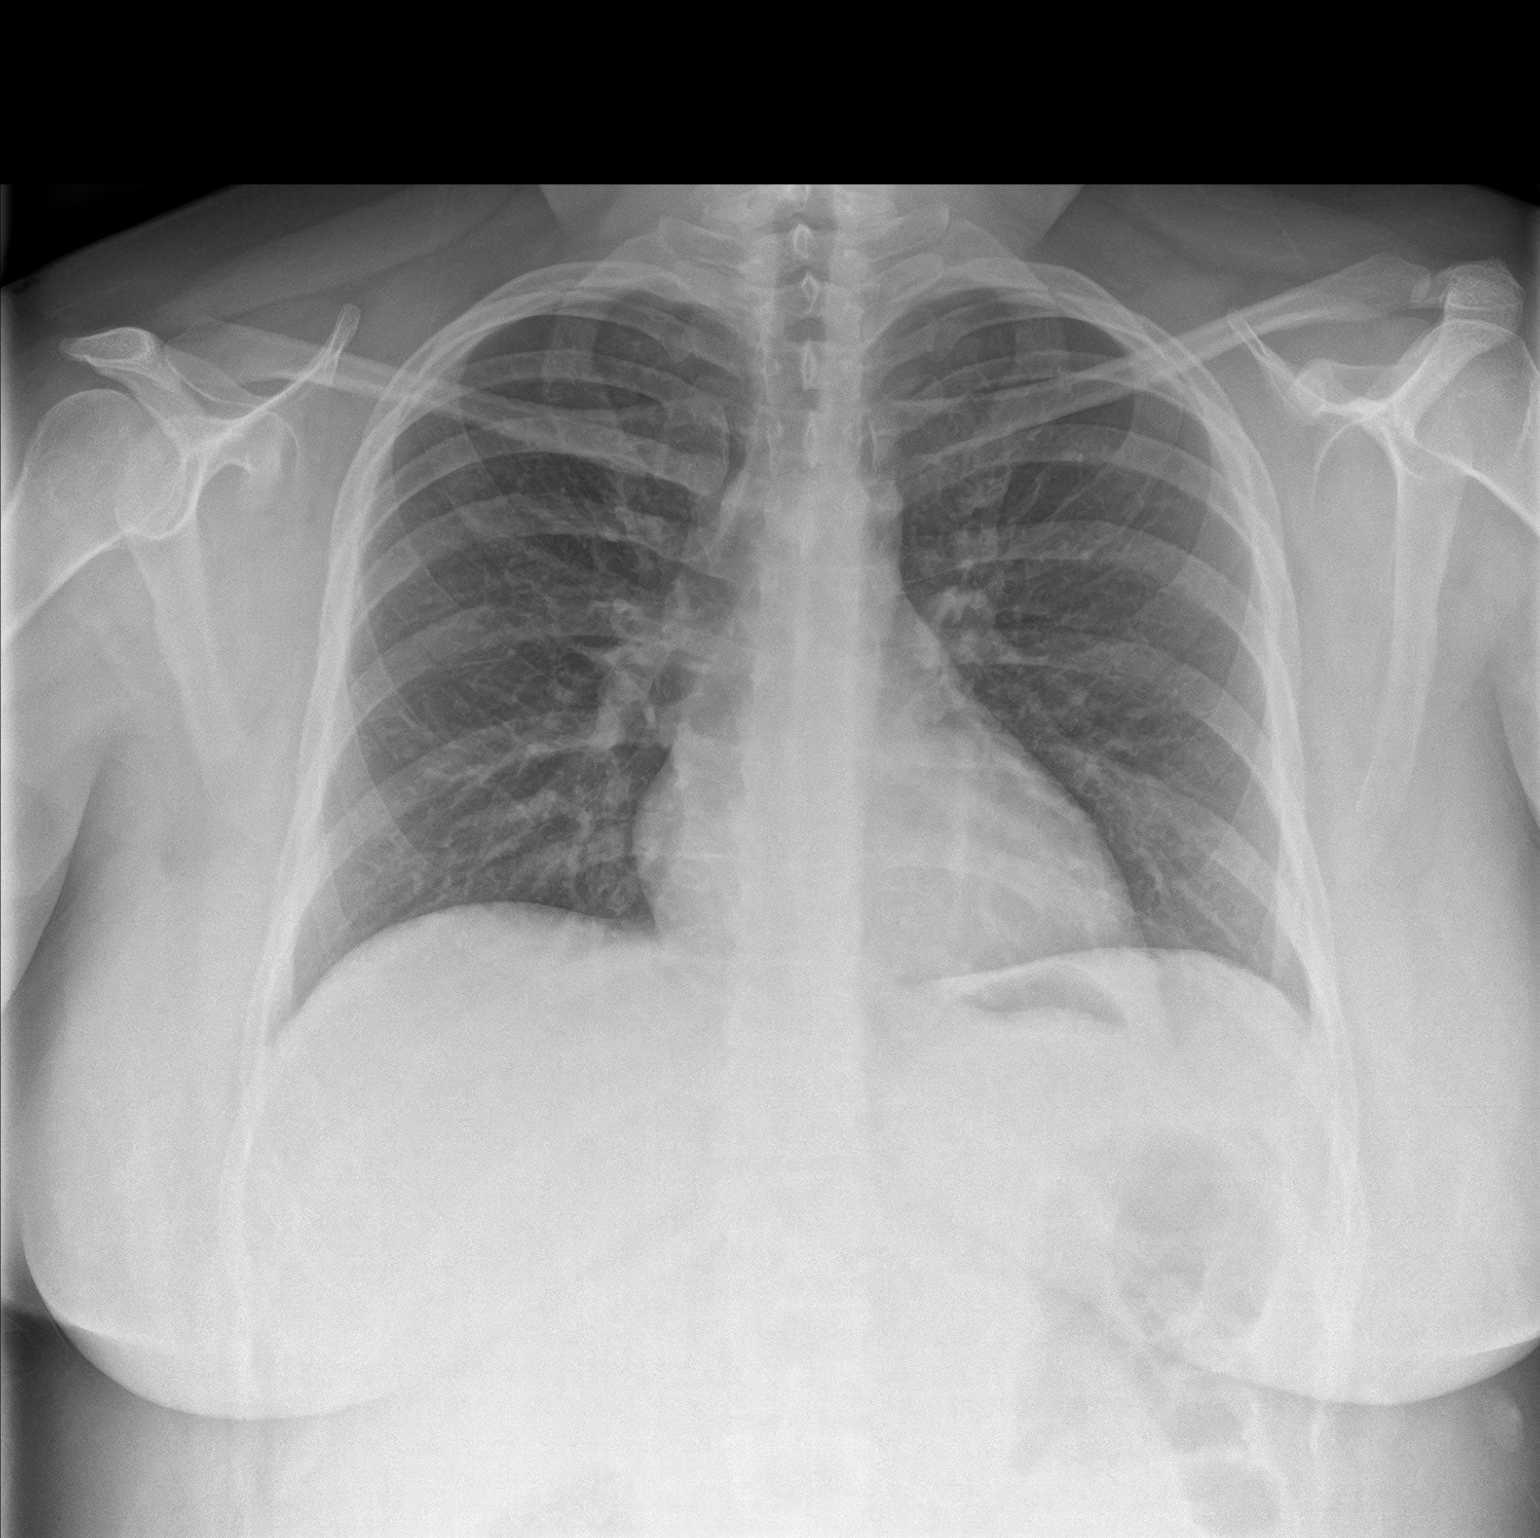
[im 2/2]
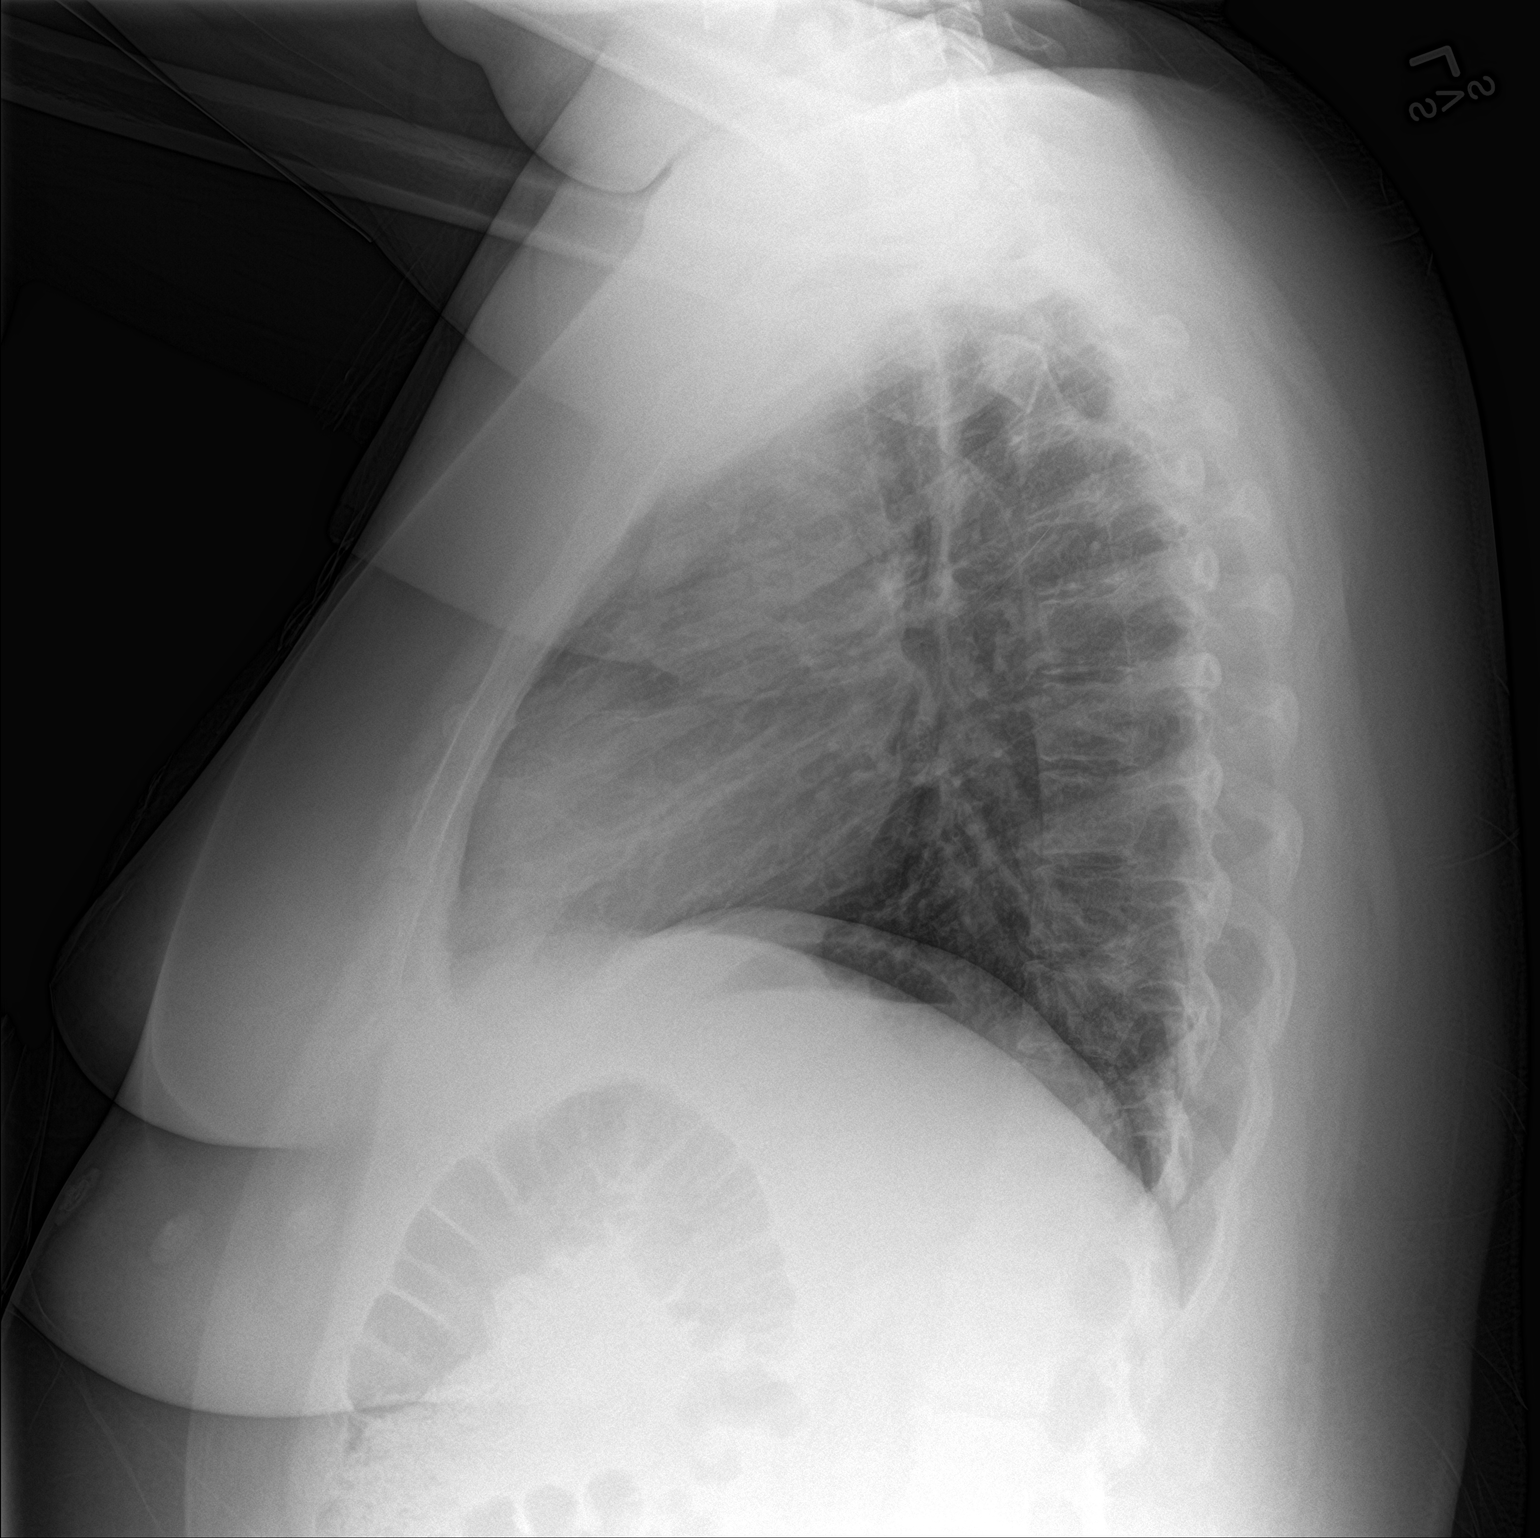

[2 of 2 positions shown; findings below may reference images not displayed]

FINDINGS: The heart size and mediastinal contours are within normal limits.
Both lungs are clear. The visualized skeletal structures are
unremarkable.
IMPRESSION: No active cardiopulmonary disease.
